# Patient Record
Sex: Male | Born: 1935
Health system: Southern US, Community
[De-identification: ages and names within clinical notes are randomized; demographics above are authoritative.]

## PROBLEM LIST (undated history)

## (undated) DIAGNOSIS — I10 Essential (primary) hypertension: Secondary | ICD-10-CM

## (undated) DIAGNOSIS — I350 Nonrheumatic aortic (valve) stenosis: Secondary | ICD-10-CM

## (undated) DIAGNOSIS — E782 Mixed hyperlipidemia: Secondary | ICD-10-CM

## (undated) DIAGNOSIS — IMO0001 Reserved for inherently not codable concepts without codable children: Secondary | ICD-10-CM

## (undated) DIAGNOSIS — I351 Nonrheumatic aortic (valve) insufficiency: Secondary | ICD-10-CM

## (undated) DIAGNOSIS — R1310 Dysphagia, unspecified: Secondary | ICD-10-CM

## (undated) DIAGNOSIS — K219 Gastro-esophageal reflux disease without esophagitis: Secondary | ICD-10-CM

## (undated) DIAGNOSIS — I2510 Atherosclerotic heart disease of native coronary artery without angina pectoris: Secondary | ICD-10-CM

## (undated) DIAGNOSIS — Z5189 Encounter for other specified aftercare: Secondary | ICD-10-CM

## (undated) DIAGNOSIS — I779 Disorder of arteries and arterioles, unspecified: Secondary | ICD-10-CM

## (undated) DIAGNOSIS — I739 Peripheral vascular disease, unspecified: Secondary | ICD-10-CM

## (undated) DIAGNOSIS — K279 Peptic ulcer, site unspecified, unspecified as acute or chronic, without hemorrhage or perforation: Secondary | ICD-10-CM

## (undated) HISTORY — DX: Peptic ulcer, site unspecified, unspecified as acute or chronic, without hemorrhage or perforation: K27.9

## (undated) HISTORY — DX: Peripheral vascular disease, unspecified: I73.9

## (undated) HISTORY — PX: APPENDECTOMY: SHX54

## (undated) HISTORY — DX: Nonrheumatic aortic (valve) stenosis: I35.0

## (undated) HISTORY — DX: Dysphagia, unspecified: R13.10

## (undated) HISTORY — DX: Nonrheumatic aortic (valve) insufficiency: I35.1

## (undated) HISTORY — DX: Disorder of arteries and arterioles, unspecified: I77.9

## (undated) HISTORY — DX: Essential (primary) hypertension: I10

## (undated) HISTORY — DX: Mixed hyperlipidemia: E78.2

## (undated) HISTORY — DX: Atherosclerotic heart disease of native coronary artery without angina pectoris: I25.10

---

## 2007-04-13 ENCOUNTER — Ambulatory Visit: Payer: Self-pay | Admitting: Cardiology

## 2007-04-20 ENCOUNTER — Encounter: Payer: Self-pay | Admitting: Cardiology

## 2007-04-20 ENCOUNTER — Ambulatory Visit: Payer: Self-pay | Admitting: Cardiology

## 2007-07-21 ENCOUNTER — Encounter: Payer: Self-pay | Admitting: Cardiology

## 2007-07-27 ENCOUNTER — Encounter: Payer: Self-pay | Admitting: Cardiology

## 2007-11-15 ENCOUNTER — Ambulatory Visit: Payer: Self-pay | Admitting: Cardiology

## 2008-06-08 ENCOUNTER — Ambulatory Visit: Payer: Self-pay | Admitting: Cardiology

## 2008-06-13 ENCOUNTER — Encounter: Payer: Self-pay | Admitting: Cardiology

## 2008-06-29 ENCOUNTER — Encounter: Payer: Self-pay | Admitting: Physician Assistant

## 2008-09-20 DIAGNOSIS — I2510 Atherosclerotic heart disease of native coronary artery without angina pectoris: Secondary | ICD-10-CM

## 2008-09-20 DIAGNOSIS — E785 Hyperlipidemia, unspecified: Secondary | ICD-10-CM

## 2008-11-12 ENCOUNTER — Ambulatory Visit: Payer: Self-pay | Admitting: Cardiology

## 2008-11-12 DIAGNOSIS — I10 Essential (primary) hypertension: Secondary | ICD-10-CM | POA: Insufficient documentation

## 2008-11-12 DIAGNOSIS — I359 Nonrheumatic aortic valve disorder, unspecified: Secondary | ICD-10-CM | POA: Insufficient documentation

## 2008-11-14 ENCOUNTER — Telehealth: Payer: Self-pay | Admitting: Cardiology

## 2009-05-14 ENCOUNTER — Encounter: Payer: Self-pay | Admitting: Cardiology

## 2009-05-28 ENCOUNTER — Ambulatory Visit: Payer: Self-pay | Admitting: Cardiology

## 2009-08-14 ENCOUNTER — Encounter: Payer: Self-pay | Admitting: Cardiology

## 2009-11-25 ENCOUNTER — Encounter (INDEPENDENT_AMBULATORY_CARE_PROVIDER_SITE_OTHER): Payer: Self-pay | Admitting: *Deleted

## 2009-11-25 ENCOUNTER — Ambulatory Visit: Payer: Self-pay | Admitting: Cardiology

## 2009-11-28 ENCOUNTER — Ambulatory Visit: Payer: Self-pay | Admitting: Cardiology

## 2009-11-28 ENCOUNTER — Encounter: Payer: Self-pay | Admitting: Cardiology

## 2009-12-03 ENCOUNTER — Encounter (INDEPENDENT_AMBULATORY_CARE_PROVIDER_SITE_OTHER): Payer: Self-pay | Admitting: *Deleted

## 2010-02-11 NOTE — Assessment & Plan Note (Signed)
Summary: 6 MOF U   Visit Type:  Follow-up Primary Provider:  Dr. Lia Hopping   History of Present Illness: 77 year old male presents for followup. He was seen back in May. He is here with his wife. He reports no angina. He has been walking perhaps twice a week without major functional limitation. I reviewed his medications which are outlined below. Dr. Olena Leatherwood continues to follow lipids and liver function. Blood pressure is elevated somewhat today. We discussed this again. He continues to report better control at home. Last ischemic testing since his intervention with DES to the LAD in October 2007 was 2 and a half years ago.  He states that his youngest brother just recently died suddenly with a myocardial infarction.  Clinical Review Panels:  Echocardiogram Echocardiogram 1. Mild to moderate concentric left ventricular hypertrophy is         observed.         2. Global left ventricular wall motion and contractility are within         normal limits.         3. The estimated ejection fraction is greater than 65%. It is 70%.         4. The right ventricular global systolic function is normal.         5. The aortic valve leaflets are moderately thickened.         6. Mild aortic leaflet calcification is visualized.         7. Systolic excursion of the aortic valve cusps is reduced.         8. There is mild aortic regurgitation.          9. There is mild aortic stenosis. (04/20/2007)    Preventive Screening-Counseling & Management  Alcohol-Tobacco     Smoking Status: never  Current Medications (verified): 1)  Lotrel 10-20 Mg Caps (Amlodipine Besy-Benazepril Hcl) .... Take One Tab By Mouth Daily 2)  Simvastatin 40 Mg Tabs (Simvastatin) .... Take One Tablet By Mouth Daily At Bedtime 3)  Lopressor 50 Mg Tabs (Metoprolol Tartrate) .... Take 1 Tablet By Mouth Two Times A Day 4)  Furosemide 20 Mg Tabs (Furosemide) .... Take 1 Tablet By Mouth Once A Day 5)  Klor-Con M10 10 Meq Cr-Tabs  (Potassium Chloride Crys Cr) .... Take 1 Tablet By Mouth Once A Day 6)  Aspirin 81 Mg Tbec (Aspirin) .... Take 1 Tablet By Mouth Once A Day 7)  Multivitamins  Tabs (Multiple Vitamin) .... Take 1 Tablet By Mouth Once A Day 8)  Nitrostat 0.4 Mg Subl (Nitroglycerin) .... Use As Directed  Allergies (verified): No Known Drug Allergies  Comments:  Nurse/Medical Assistant: The patient's medication list and allergies were reviewed with the patient and were updated in the Medication and Allergy Lists.  Past History:  Past Medical History: Last updated: 11/12/2008 CAD - DES to LAD October 2007 Cerebrovascular Disease - mild carotid artery atherosclerosis with bruits Hyperlipidemia Hypertension Aortic Stenosis - mild Aortic Insufficiency - mild Diabetes Type 2 Peptic ulcer disease  Social History: Last updated: 09/20/2008 Married  Tobacco Use - No.  Alcohol Use - yes Regular Exercise - yes Drug Use - no  Review of Systems  The patient denies anorexia, fever, chest pain, syncope, dyspnea on exertion, peripheral edema, melena, and hematochezia.         Otherwise reviewed and negative.  Vital Signs:  Patient profile:   77 year old male Height:      70 inches Weight:  183 pounds BMI:     26.35 Pulse rate:   76 / minute BP sitting:   151 / 74  (left arm) Cuff size:   regular  Vitals Entered By: Carlye Grippe (November 25, 2009 11:00 AM)  Nutrition Counseling: Patient's BMI is greater than 25 and therefore counseled on weight management options.  Physical Exam  Additional Exam:  Overweight male in no acute distress. HEENT: Conjunctiva and lids normal, oropharynx is clear. Neck: Supple, soft carotid bruits as noted previously. Lungs: Clear without labored breathing. Cardiac: Regular rate and rhythm, 2/6 systolic murmur at the right base. No S3 gallop. Abdomen: Soft, nontender, bowel sounds present. Extremities: No pitting edema, distal pulses 2+. Skin: Warm and  dry. Musculoskeletal: No gross deformities. Neuropsychiatric: Alert and oriented x3, affect appropriate.   Nuclear Study  Procedure date:  04/20/2007  Findings:      No diagnostic ST segment changes or chest pain. LVEF 66%. Fixed defect in the inferior wall suggests either diaphragmatic attenuation or scar. No frank ischemia.  EKG  Procedure date:  11/25/2009  Findings:      Sinus rhythm at 73 beats per minute with nonspecific T-wave changes.  Impression & Recommendations:  Problem # 1:  CAD, NATIVE VESSEL (ICD-414.01)  Patient relatively stable on medical therapy following prior intervention with DES to the LAD in October 2007. Last ischemic evaluation was 2-1/2 years ago. ECG shows nonspecific T-wave changes. Blood pressure trend does seem to have crept up over time. He is exercising less than he used to. We discussed these issues, and will also recommend a followup Lexiscan Cardiolite on medical therapy to exclude any progression in ischemic burden. If stable, followup in 6 months, otherwise can discuss further.  His updated medication list for this problem includes:    Lotrel 10-20 Mg Caps (Amlodipine besy-benazepril hcl) .[name]... Take one tab by mouth daily    Lopressor 50 Mg Tabs (Metoprolol tartrate) .[name]... Take 1 tablet by mouth two times a day    Aspirin 81 Mg Tbec (Aspirin) .[name]... Take 1 tablet by mouth once a day    Nitrostat 0.4 Mg Subl (Nitroglycerin) ..... Use as directed  Orders: EKG w/ Interpretation (93000) Nuclear Med (Nuc Med)  Problem # 2:  ESSENTIAL HYPERTENSION, BENIGN (ICD-401.1)  Blood pressure trend is up. We discussed sodium restriction, also followup with primary care physician. May need further up titration medical therapy.  His updated medication list for this problem includes:    Lotrel 10-20 Mg Caps (Amlodipine besy-benazepril hcl) .[name]... Take one tab by mouth daily    Lopressor 50 Mg Tabs (Metoprolol tartrate) .[name]... Take 1 tablet by mouth two times a  day    Furosemide 20 Mg Tabs (Furosemide) .[name]... Take 1 tablet by mouth once a day    Aspirin 81 Mg Tbec (Aspirin) .[name]... Take 1 tablet by mouth once a day  Problem # 3:  HYPERLIPIDEMIA-MIXED (ICD-272.4)  Followed by Dr. Olena Leatherwood.  His updated medication list for this problem includes:    Simvastatin 40 Mg Tabs (Simvastatin) .[name]... Take one tablet by mouth daily at bedtime  Problem # 4:  AORTIC DISORDER (ICD-424.1)  Mild aortic stenosis and mild aortic regurgitation.  His updated medication list for this problem includes:    Lotrel 10-20 Mg Caps (Amlodipine besy-benazepril hcl) .[name]... Take one tab by mouth daily    Lopressor 50 Mg Tabs (Metoprolol tartrate) .[name]... Take 1 tablet by mouth two times a day    Furosemide 20 Mg Tabs (Furosemide) .[name]... Take  1 tablet by mouth once a day    Nitrostat 0.4 Mg Subl (Nitroglycerin) ..... Use as directed  Patient Instructions: 1)  Your physician wants you to follow-up in: 6 months. You will receive a reminder letter in the mail one-two months in advance. If you don't receive a letter, please call our office to schedule the follow-up appointment. 2)  Your physician has requested that you have an Best boy.  For further information please visit [email or web address]  Please follow instruction sheet, as given. If the results of your test are normal or stable, you will receive a letter. If they are abnormal, the nurse will contact you by phone.  3)  Your physician recommends that you continue on your current medications as directed. Please refer to the Current Medication list given to you today.

## 2010-02-11 NOTE — Letter (Signed)
Summary: Lexiscan or Dobutamine Pharmacist, community at [facility]  [address]   [place], [place] [address]   Phone: [phone]  Fax: [phone]      [facility] Cardiovascular Services  Lexiscan or Dobutamine Cardiolite Strss Test    [facility]  Appointment Date:_  Appointment Time:_  Your doctor has ordered a CARDIOLITE STRESS TEST using a medication to stimulate exercise so that you will not have to walk on the treadmill to determine the condition of your heart during stress. If you take blood pressure medication, ask your doctor if you should take it the day of your test. You should not have anything to eat or drink at least 4 hours before your test is scheduled, and no caffeine, including decaffeinated tea and coffee, chocolate, and soft drinks for 24 hours before your test.  You will need to register at the Outpatient/Main Entrance at the hospital 15 minutes before your appointment time. It is a good idea to bring a copy of your order with you. They will direct you to the Diagnostic Imaging (Radiology) Department.  You will be asked to undress from the waist up and given a hospital gown to wear, so dress comfortably from the waist down for example: Sweat pants, shorts, or skirt Rubber soled lace up shoes (tennis shoes)  Plan on about three hours from registration to release from the hospital  You may take your medications with water the morning of your test.  If the results of your test are normal or stable, you will receive a letter. If they are abnormal, the nurse will contact you by phone.

## 2010-02-11 NOTE — Assessment & Plan Note (Signed)
Summary: 6 mo fu per may reminder-srs   Visit Type:  Follow-up Primary Provider:  Dr. Lia Hopping   History of Present Illness: 77 year old male presents for followup. He denies any problems with angina. He and his wife just visited the [facility], he did some walking there without any significant shortness of breath. He reports compliance with his medications which are reviewed below.  Mr. Deharo states that he just had recent lipids obtained by Dr. Olena Leatherwood.  Today we reviewed his echocardiogram from April 2009. He has a cardiac murmur consistent with mild aortic stenosis, previously documented.  We also discussed considering a followup stress test around the time of his next visit.  Preventive Screening-Counseling & Management  Alcohol-Tobacco     Smoking Status: never  Current Medications (verified): 1)  Lotrel 10-20 Mg Caps (Amlodipine Besy-Benazepril Hcl) .... Take One Tab By Mouth Daily 2)  Simvastatin 40 Mg Tabs (Simvastatin) .... Take One Tablet By Mouth Daily At Bedtime 3)  Lopressor 50 Mg Tabs (Metoprolol Tartrate) .... Take 1 Tablet By Mouth Two Times A Day 4)  Furosemide 20 Mg Tabs (Furosemide) .... Take 1 Tablet By Mouth Once A Day 5)  Klor-Con M10 10 Meq Cr-Tabs (Potassium Chloride Crys Cr) .... Take 1 Tablet By Mouth Once A Day 6)  Aspirin 81 Mg Tbec (Aspirin) .... Take 1 Tablet By Mouth Once A Day 7)  Multivitamins  Tabs (Multiple Vitamin) .... Take 1 Tablet By Mouth Once A Day 8)  Nitrostat 0.4 Mg Subl (Nitroglycerin) .... Use As Directed  Allergies (verified): No Known Drug Allergies  Comments:  Nurse/Medical Assistant: The patient is currently on medications but does not know the name or dosage at this time. Instructed to contact our office with details. Will update medication list at that time.  Past History:  Past Medical History: Last updated: 11/12/2008 CAD - DES to LAD October 2007 Cerebrovascular Disease - mild carotid artery atherosclerosis with  bruits Hyperlipidemia Hypertension Aortic Stenosis - mild Aortic Insufficiency - mild Diabetes Type 2 Peptic ulcer disease  Social History: Last updated: 09/20/2008 Married  Tobacco Use - No.  Alcohol Use - yes Regular Exercise - yes Drug Use - no  Review of Systems  The patient denies anorexia, fever, chest pain, syncope, dyspnea on exertion, peripheral edema, prolonged cough, melena, and hematochezia.         Otherwise reviewed and negative except as outlined.  Clinical Review Panels:  Echocardiogram Echocardiogram 1. Mild to moderate concentric left ventricular hypertrophy is         observed.         2. Global left ventricular wall motion and contractility are within         normal limits.         3. The estimated ejection fraction is greater than 65%. It is 70%.         4. The right ventricular global systolic function is normal.         5. The aortic valve leaflets are moderately thickened.         6. Mild aortic leaflet calcification is visualized.         7. Systolic excursion of the aortic valve cusps is reduced.         8. There is mild aortic regurgitation.          9. There is mild aortic stenosis. (04/20/2007)    Vital Signs:  Patient profile:   77 year old male Height:  70 inches Weight:      190 pounds Pulse rate:   62 / minute BP sitting:   161 / 69  (left arm) Cuff size:   regular  Vitals Entered By: Carlye Grippe (May 28, 2009 10:47 AM)  Physical Exam  Additional Exam:  Overweight male in no acute distress. HEENT: Conjunctiva and lids normal, oropharynx is clear. Neck: Supple, soft carotid bruits as noted previously. Lungs: Clear without labored breathing. Cardiac: Regular rate and rhythm, 2/6 systolic murmur at the right base. No S3 gallop. Extremities: No pitting edema, distal pulses 2+.   EKG  Procedure date:  05/28/2009  Findings:      Normal sinus rhythm at 62 beats per minute.  Impression & Recommendations:  Problem #  1:  CAD, NATIVE VESSEL (ICD-414.01)  Symptomatically stable. Status post DES to LAD October 2007. I will see him back in 6 months, and we can discuss a followup stress test at that time for ischemic surveillance.  His updated medication list for this problem includes:    Lotrel 10-20 Mg Caps (Amlodipine besy-benazepril hcl) .[name]... Take one tab by mouth daily    Lopressor 50 Mg Tabs (Metoprolol tartrate) .[name]... Take 1 tablet by mouth two times a day    Aspirin 81 Mg Tbec (Aspirin) .[name]... Take 1 tablet by mouth once a day    Nitrostat 0.4 Mg Subl (Nitroglycerin) ..... Use as directed  Orders: EKG w/ Interpretation (93000)  Problem # 2:  ESSENTIAL HYPERTENSION, BENIGN (ICD-401.1)  Blood pressure elevated today. I discussed this with Mr. Chap. He reports systolic numbers in the "120-140" range at home. I asked him to keep a close eye on this, and follow up with Dr. Olena Leatherwood, as he may need further medication adjustments.  His updated medication list for this problem includes:    Lotrel 10-20 Mg Caps (Amlodipine besy-benazepril hcl) .[name]... Take one tab by mouth daily    Lopressor 50 Mg Tabs (Metoprolol tartrate) .[name]... Take 1 tablet by mouth two times a day    Furosemide 20 Mg Tabs (Furosemide) .[name]... Take 1 tablet by mouth once a day    Aspirin 81 Mg Tbec (Aspirin) .[name]... Take 1 tablet by mouth once a day  Problem # 3:  HYPERLIPIDEMIA-MIXED (ICD-272.4)  Followed by Dr. Olena Leatherwood. Will request recent lipid profile numbers for review.  His updated medication list for this problem includes:    Simvastatin 40 Mg Tabs (Simvastatin) .[name]... Take one tablet by mouth daily at bedtime  Patient Instructions: 1)  Your physician wants you to follow-up in: 6 months. You will receive a reminder letter in the mail one-two months in advance. If you don't receive a letter, please call our office to schedule the follow-up appointment. 2)  We have called Dr. Olena Leatherwood to request your recent lab results.  3)  Your  physician recommends that you continue on your current medications as directed. Please refer to the Current Medication list given to you today.

## 2010-02-11 NOTE — Letter (Signed)
Summary: Engineer, materials at [facility]  [address]   [place], [place] [address]   Phone: [phone]  Fax: [phone]        December 03, 2009 MRN: [id number]    [facility] [address] [place], [place] [address]    Dear Mr. Dicicco,  Your test ordered by Selena Batten has been reviewed by your physician (or physician assistant) and was found to be normal or stable. Your physician (or physician assistant) felt no changes were needed at this time.  ____ Echocardiogram  __X__ Cardiac Stress Test  ____ Lab Work  ____ Peripheral vascular study of arms, legs or neck  ____ CT scan or X-ray  ____ Lung or Breathing test  ____ Other:   Thank you.   Cyril Loosen, RN, BSN    Duane Boston, M.D., F.A.C.C. Thressa Sheller, M.D., F.A.C.C. Oneal Grout, M.D., F.A.C.C. Cheree Ditto, M.D., F.A.C.C. Daiva Nakayama, M.D., F.A.C.C. Kenney Houseman, M.D., F.A.C.C. Jeanne Ivan, PA-C

## 2010-05-22 ENCOUNTER — Encounter: Payer: Self-pay | Admitting: Cardiology

## 2010-05-23 ENCOUNTER — Encounter: Payer: Self-pay | Admitting: Cardiology

## 2010-05-23 ENCOUNTER — Ambulatory Visit (INDEPENDENT_AMBULATORY_CARE_PROVIDER_SITE_OTHER): Payer: Medicare Other | Admitting: Cardiology

## 2010-05-23 VITALS — BP 159/73 | HR 59 | Ht 70.0 in | Wt 186.0 lb

## 2010-05-23 DIAGNOSIS — I10 Essential (primary) hypertension: Secondary | ICD-10-CM

## 2010-05-23 DIAGNOSIS — I2510 Atherosclerotic heart disease of native coronary artery without angina pectoris: Secondary | ICD-10-CM

## 2010-05-23 DIAGNOSIS — I359 Nonrheumatic aortic valve disorder, unspecified: Secondary | ICD-10-CM

## 2010-05-23 DIAGNOSIS — E785 Hyperlipidemia, unspecified: Secondary | ICD-10-CM

## 2010-05-23 DIAGNOSIS — I350 Nonrheumatic aortic (valve) stenosis: Secondary | ICD-10-CM

## 2010-05-23 NOTE — Assessment & Plan Note (Signed)
Symptomatically stable on medical therapy. Continue regular exercise. Cardiolite from November 2011 was overall reassuring. Continue observation.

## 2010-05-23 NOTE — Assessment & Plan Note (Signed)
Mild, asymptomatic, with leukopenia. 

## 2010-05-23 NOTE — Progress Notes (Signed)
Clinical Summary Edward Pena is a 77 y.o.male presenting for followup.  He was seen in November 2011. Followup Cardiolite was arranged at that time, noted below. He missed his last followup visit.  He reports no progressive angina or shortness of breath. Continues to do daily chores indoors and out. Indicates compliance with his medications. He is changing primary care physicians, plans to establish with Dr. Sherril Croon next month.  Checks his blood pressure regularly at home, also at [employer]. States his systolics are typically in the 120s to 130s.   No Known Allergies  Current outpatient prescriptions:amLODipine-benazepril (LOTREL) 10-20 MG per capsule, Take 1 capsule by mouth daily.  , Disp: , Rfl: ;  aspirin 81 MG tablet, Take 81 mg by mouth daily.  , Disp: , Rfl: ;  furosemide (LASIX) 20 MG tablet, Take 20 mg by mouth daily.  , Disp: , Rfl: ;  metoprolol (LOPRESSOR) 50 MG tablet, Take 50 mg by mouth 2 (two) times daily.  , Disp: , Rfl:  Multiple Vitamin (MULTIVITAMIN) tablet, Take 1 tablet by mouth daily.  , Disp: , Rfl: ;  nitroGLYCERIN (NITROSTAT) 0.4 MG SL tablet, Place 0.4 mg under the tongue every 5 (five) minutes as needed.  , Disp: , Rfl: ;  potassium chloride (KLOR-CON) 10 MEQ CR tablet, Take 10 mEq by mouth daily.  , Disp: , Rfl: ;  simvastatin (ZOCOR) 40 MG tablet, Take 40 mg by mouth at bedtime.  , Disp: , Rfl:   Past Medical History  Diagnosis Date  . Coronary atherosclerosis of native coronary artery     DES LAD 10/07  . Carotid artery disease     Mild  . Essential hypertension, benign   . Mixed hyperlipidemia   . Aortic stenosis     Mild  . Aortic regurgitation     Mild  . Type 2 diabetes mellitus   . Peptic ulcer disease     Social History Mr. Eckhardt reports that he has never smoked. He has never used smokeless tobacco. Mr. Kall reports that he drinks alcohol.  Review of Systems No palpitations or syncope. No reported bleeding problems. Otherwise reviewed and  negative.  Physical Examination Filed Vitals:   05/23/10 1045  BP: 159/73  Pulse: 59   Overweight male in no acute distress. HEENT: Conjunctiva and lids normal, oropharynx is clear. Neck: Supple, soft carotid bruits as noted previously. Lungs: Clear without labored breathing. Cardiac: Regular rate and rhythm, 2/6 systolic murmur at the right base. No S3 gallop. Abdomen: Soft, nontender, bowel sounds present. Extremities: No pitting edema, distal pulses 2+. Skin: Warm and dry. Musculoskeletal: No gross deformities. Neuropsychiatric: Alert and oriented x3, affect appropriate.  ECG Sinus rhythm at 60 with PR interval 220 ms.  Studies Lexiscan Cardiolite 11/28/2009: No diagnostic ST changes. LVEF 71%. Fixed inferior defect consistent with scar with overlay of diaphragmatic attenuation, no active ischemia. Problem List and Plan

## 2010-05-23 NOTE — Assessment & Plan Note (Signed)
Patient to establish with Dr. Sherril Croon next month. Will likely have blood work at that time. Aim for LDL control around 70.

## 2010-05-23 NOTE — Assessment & Plan Note (Signed)
Blood pressure elevated today, although sounds better controlled in general based on outpatient checks. I asked the patient to keep a close eye on this. Sodium restriction recommended.

## 2010-05-23 NOTE — Patient Instructions (Signed)
Continue all current medications. Your physician wants you to follow up in: 6 months.  You will receive a reminder letter in the mail one-two months in advance.  If you don't receive a letter, please call our office to schedule the follow up appointment   

## 2010-05-27 NOTE — Assessment & Plan Note (Signed)
[facility]                          [facility] OFFICE NOTE   NAME:GOINSTevion, Laforge                          MRN:          [id number]  DATE:06/08/2008                            DOB:          04-Nov-1926    PRIMARY CARE PHYSICIAN:  Dr. Lia Hopping.   REASON FOR VISIT:  Scheduled followup.   HISTORY OF PRESENT ILLNESS:  Mr. Wix is doing fairly well since his  last visit in November 2009.  He reports no problems with recurrent  angina.  He has [place] the mall with his [place].  He  has NYHA class II dyspnea on exertion.  Today, his followup  electrocardiogram is normal showing sinus rhythm at 62 beats per minute.  I did refer him for followup carotid Dopplers with history of carotid  bruits and this was reassuring demonstrating mild bilateral carotid  bifurcation and proximal internal carotid plaque without hemodynamic  stenosis.  His blood pressure remains elevated today.  We have talked  about making some medication adjustments in the past.  We also discussed  aggressive management of his LDL.   ALLERGIES:  No known drug allergies.   MEDICATIONS:  1. Amlodipine.  2. Benazepril 5/20 mg p.o. daily.  3. Lopressor 50 mg p.o. b.i.d.  4. Furosemide 20 mg p.o. daily.  5. Klor-Con 10 mEq p.o. daily.  6. Pravastatin 40 mg p.o. daily.  7. Aspirin 81 mg p.o. daily.  8. Multivitamin 1 p.o. daily.  9. Sublingual nitroglycerin 0.4 mg p.r.n. (has not needed).   REVIEW OF SYSTEMS:  Outlined above.  Otherwise reviewed and negative.   PHYSICAL EXAMINATION:  VITAL SIGNS:  Blood pressure 163/71; heart rate  is 62; weight is 184 pounds, which is up 3 pounds from his last visit.  GENERAL:  He is comfortable in no acute distress.  HEENT:  Conjunctivae normal.  Oropharynx is clear.  NECK:  Supple.  Soft carotid bruits are noted as before.  No  thyromegaly.  LUNGS:  Clear without labored breathing.  HEART:  Regular rate and rhythm, 2/6 systolic  murmur at the right base.  No diastolic murmur.  No S3 gallop.  ABDOMEN:  Soft, nontender.  No active bowel sounds.  EXTREMITIES:  Exhibit no pitting edema.  Distal pulses are 2+.  SKIN:  Warm and dry.  MUSCULOSKELETAL:  No kyphosis noted.  NEUROPSYCHIATRIC:  The patient is alert and oriented x3.  Affect is  appropriate.   IMPRESSION/RECOMMENDATIONS:  1. Coronary artery disease status post previous drug-eluting stent      placement in October 2007 to the left anterior descending based on      limited records (done in [place]).  He has subsequent      documentation of normal left ventricular systolic function with      mild aortic stenosis and mild aortic regurgitation as of April      2009.  Ischemic testing at that time was reassuring as well.  He is      not reporting any progressive angina and we will therefore continue      medical  therapy with observation over the next 6 months.  2. Hypertension, non-optimally controlled.  We will increase      amlodipine and benazepril to 10/20 mg p.o. daily.  3. History of hyperlipidemia, on pravastatin.  In reviewing his      available lipid records, his LDL has not been at goal.  I will      arrange a followup lipid profile and liver function tests and      consider switching him to simvastatin depending on the results.      Ideally, his LDL should be around 70.  4. Mild carotid artery plaque with bruits at baseline.     Jonelle Sidle, MD  Electronically Signed    SGM/MedQ  DD: 06/08/2008  DT: 06/09/2008  Job #: [id number]   cc:   Lia Hopping

## 2010-05-27 NOTE — Assessment & Plan Note (Signed)
[facility]                          [facility]   NAME:Edward Pena, Cafaro                          MRN:          [id number]  DATE:11/15/2007                            DOB:          [birth year]    PRIMARY CARE PHYSICIAN:  Lia Hopping, MD.   REASON FOR VISIT:  Scheduled followup.   HISTORY OF PRESENT ILLNESS:  I met [name] back in April of this year  to establish cardiology followup.  He continues to do fairly well  without any significant angina.  He walks at least a mile approximately  3 or 4 times a week with his wife.  I referred him for followup for  surveillance testing in light of his previous history of drug-eluting  stent placement back in 2007.  He had a Cardiolite study, which  demonstrated diaphragmatic attenuation versus mild degree of scar  without any frank ischemia and associated with an overall normal  ejection fraction of 66%.  Echocardiography also demonstrated normal  left ventricular systolic function and there was evidence of mild aortic  stenosis with mild aortic regurgitation.  I reviewed these studies with  the patient today.  He is on a stable medical regimen and is due for  followup lipids with Dr. Olena Leatherwood in December.   ALLERGIES:  No known drug allergies.   PRESENT MEDICATIONS:  1. Amlodipine/benazepril 5/20 mg p.o. daily.  2. Metoprolol 50 mg p.o. b.i.d.  3. Lasix 20 mg p.o. daily.  4. Klor-Con 10 mEq p.o. daily.  5. Pravastatin 40 mg p.o. daily.  6. Aspirin 81 mg p.o. daily.  7. Multivitamin daily.  8. Omega-3 supplement 1000 mg daily.  9. Nitroglycerin 0.4 mg p.r.n.   REVIEW OF SYSTEMS:  As outlined above, otherwise negative.   PHYSICAL EXAMINATION:  VITAL SIGNS:  Blood pressure today elevated at  171/75, heart rate is 69, and weight is 181 pounds.  GENERAL:  The patient is comfortable, and in no acute distress.  HEENT:  Conjunctivae are normal.  Pharynx is clear.  NECK:  Supple.  Soft carotid  bruit is noted, left greater than right.  No thyromegaly.  LUNGS:  Clear without labored breathing.  CARDIAC:  Reveals a regular rate and rhythm, 2/6 systolic murmur at the  base.  No diastolic murmur.  No S3 gallop.  EXTREMITIES:  Exhibit no  significant pitting edema.   IMPRESSION AND RECOMMENDATIONS:  1. Cardiovascular disease, status post previous drug-eluting stent      placement in October 2007 at a facility in [place].  This is      presumably in the distribution of left anterior descending based on      limited records.  He has normal left ventricular systolic function      and no significant angina at this time.  Recent followup Cardiolite      is a low risk.  I would anticipate continuing medical therapy.  I      have asked him to pay attention to his blood pressure.  He does      check this at home and states  that typically his systolics are in      the 130s.  He may well need up titration of his baseline therapy.      I also noticed carotid bruits on examination and we will plan      followup carotid Dopplers.  He is due for a followup lipid profile      with Dr. Olena Leatherwood in December and would tend to aim for aggressive      LDL control around 70.  Consider switching to a higher potency      statin, if this is not the case.  2. Otherwise, our plan to follow be over the next 6 months.     Jonelle Sidle, MD  Electronically Signed    SGM/MedQ  DD: 11/15/2007  DT: 11/16/2007  Job #: [id number]   cc:   Lia Hopping

## 2010-05-27 NOTE — Assessment & Plan Note (Signed)
[facility]                          [facility]   NAME:Edward Pena, Edward Pena                          MRN:          [id number]  DATE:04/13/2007                            DOB:          [birth year]    PRIMARY CARE PHYSICIAN:  Lia Hopping, M.D.   REASON FOR VISIT:  Establish cardiology follow up.   HISTORY OF PRESENT ILLNESS:  Edward Pena is a pleasant 77 year old male  with a reported history of coronary artery disease.  There are limited  records at this time.  He states that he was referred to Dr. Jim Desanctis back  in 2007 following an abnormal electrocardiogram, although without any  obvious angina symptoms.  He apparently had an abnormal stress test at  that time resulting in a cardiac catheterization in [place], [place] by  Dr. Lady Gary and subsequent placement of three drug-eluting stents,  vascular anatomy unknown.  This was done in October of 2007, and the  patient reports that he was on aspirin and Plavix for a total of 1 year.  He has not had a recent follow-up ischemic evaluation.  Symptomatically,  Edward Pena denies having any exertional angina or limiting dyspnea on  exertion.  He and his wife walk approximately 2-3 times a week, up to 30  minutes at a time.  His electrocardiogram today shows sinus rhythm with  nonspecific T-wave changes.  There is no old tracing for comparison.  He  has been compliant with his medications and states that Dr. Olena Leatherwood just  recently checked his lipid profile.   ALLERGIES:  NO KNOWN DRUG ALLERGIES.   PRESENT MEDICATIONS:  1. Amlodipine.  2. Benazepril 5/20 mg p.o. daily.  3. Metoprolol 50 mg p.o. b.i.d.  4. Lasix 20 mg p.o. daily.  5. Klor-Con 10 mEq p.o. daily.  6. Pravastatin 40 mg p.o. daily.  7. Aspirin 81 mg p.o. daily.  8. Multivitamin daily.  9. Omega 3 supplements 1000 mg p.o. daily (presently off of this).   PAST MEDICAL HISTORY:  1. As outlined above.  2. History of hypertension.  3.  Hyperlipidemia.  4. Diet-controlled type 2 diabetes mellitus.  5. He had previous peptic ulcer disease.  6. Prior appendectomy.   SOCIAL HISTORY:  The patient is married.  He has no active tobacco use  history.  Drinks two alcoholic beverages a day.  Has no children.   FAMILY HISTORY:  His family history was reviewed and is noncontributory  at present.   REVIEW OF SYSTEMS:  As outlined above.  The patient has had no fevers or  chills.  No cough or weight loss.  No visual changes or headaches.  No  palpitations, orthopnea or PND.  He does experience occasional right  ankle swelling and possibly gout.  Otherwise, systems are negative.   PHYSICAL EXAMINATION:  VITAL SIGNS:  Blood pressure is 136/70, heart  rate is 62, weight is 178.4 pounds.  GENERAL:  This is a well-nourished male in no acute distress.  HEENT:  Conjunctiva, lids normal.  Oropharynx is clear.  NECK:  Neck is supple.  No elevated jugular  venous pressure.  No loud  bruits are evident.  No thyromegaly.  LUNGS:  Clear without labored breathing.  CARDIAC:  A regular rate and rhythm.  A 2/6 systolic murmur heard at the  base.  Preserved second heart sound.  No S3 gallop or pericardial rub.  ABDOMEN:  Soft, nontender.  No bruits or pulsatile mass.  EXTREMITIES:  No frank pitting edema and distal pulses are 2+.  SKIN:  Warm and dry.  MUSCULOSKELETAL:  No kyphosis is noted.  NEUROPSYCHIATRIC:  The patient is alert and oriented x3.  Affect is  appropriate   IMPRESSION AND RECOMMENDATIONS:  1. Reported history of coronary artery disease as outlined above,      status post placement of what would appear to be three drug-eluting      stents back in October of 2007 in [place], [place].  He is stable      symptomatically, and his electrocardiogram shows nonspecific T-wave      changes.  Medical regimen looks to be adequate.  At this point, we      will plan to obtain his prior cardiac records to better understand      his  previous intervention and also refer him for a follow-up      adenosine Cardiolite on medical therapy, as well as an      echocardiogram to further assess cardiac murmur.  If these results      are reassuring, I will plan to see him back in approximately 6      months' time.  Otherwise, we can evaluate him further.  2. Hyperlipidemia, recently checked by Dr. Olena Leatherwood and on long-term      pravastatin.  LDL control should be around 70.  3. History of hypertension.  4. Reported history of diet-controlled diabetes mellitus.     Jonelle Sidle, MD  Electronically Signed    SGM/MedQ  DD: 04/13/2007  DT: 04/13/2007  Job #: [id number]   cc:   Lia Hopping

## 2010-07-01 ENCOUNTER — Encounter: Payer: Self-pay | Admitting: Cardiology

## 2010-07-03 ENCOUNTER — Telehealth: Payer: Self-pay | Admitting: *Deleted

## 2010-07-03 NOTE — Telephone Encounter (Signed)
Pt needs to confer with Dr Sherril Croon, who ordered the study, as to when he should return to [facility]

## 2010-07-03 NOTE — Telephone Encounter (Signed)
Pt's wife states pt was seen by Dr. Sherril Croon recently. She states Dr. Sherril Croon heard a murmur and ordered an Echo. She states Echo was abnormal and would like to know if pt needs to see Dr. Diona Browner. He was last seen by Dr. Diona Browner on May 11th and has a reminder in Epic for November. She is aware Dr. Diona Browner is out of the office this week and a note will be sent to Gene along with the Echo report for further review. (Pt also had a carotid doppler at [facility].)

## 2010-07-03 NOTE — Telephone Encounter (Signed)
Spoke with Edward Pena at [facility] who states Dr. Bonnita Levan notes say to f/u with Dr. Diona Browner regarding results and repeat Echo in 1 year.   Attempted to reach pt to schedule f/u with Dr. Diona Browner. Left message to call back on voicemail. (Scheduled pt for July 5th at 2pm opening d/t cancellation.)

## 2010-07-04 NOTE — Telephone Encounter (Signed)
Pt's wife notified and will have pt come on 7/5 for OV>

## 2010-07-17 ENCOUNTER — Ambulatory Visit (INDEPENDENT_AMBULATORY_CARE_PROVIDER_SITE_OTHER): Payer: Medicare Other | Admitting: Cardiology

## 2010-07-17 ENCOUNTER — Encounter: Payer: Self-pay | Admitting: Cardiology

## 2010-07-17 VITALS — BP 158/68 | HR 61 | Ht 70.0 in | Wt 183.1 lb

## 2010-07-17 DIAGNOSIS — I350 Nonrheumatic aortic (valve) stenosis: Secondary | ICD-10-CM

## 2010-07-17 DIAGNOSIS — I359 Nonrheumatic aortic valve disorder, unspecified: Secondary | ICD-10-CM

## 2010-07-17 DIAGNOSIS — I2510 Atherosclerotic heart disease of native coronary artery without angina pectoris: Secondary | ICD-10-CM

## 2010-07-17 DIAGNOSIS — I10 Essential (primary) hypertension: Secondary | ICD-10-CM

## 2010-07-17 NOTE — Assessment & Plan Note (Signed)
Overall moderate based on review of echocardiogram report. Patient is symptomatically stable. Plan will be a followup echocardiogram in 6 months to exclude rapid progression, and then determine appropriate medical followup from there.

## 2010-07-17 NOTE — Assessment & Plan Note (Signed)
Continue followup with Dr. Vyas. 

## 2010-07-17 NOTE — Patient Instructions (Signed)
Your physician wants you to follow-up in: 6 months. You will receive a reminder letter in the mail one-two months in advance. If you don't receive a letter, please call our office to schedule the follow-up appointment. Your physician has requested that you have an echocardiogram. Echocardiography is a painless test that uses sound waves to create images of your heart. It provides your doctor with information about the size and shape of your heart and how well your heart's chambers and valves are working. This procedure takes approximately one hour. There are no restrictions for this procedure. DO IN 6 MONTHS BEFORE YOUR NEXT OFFICE VISIT. Your physician recommends that you continue on your current medications as directed. Please refer to the Current Medication list given to you today.

## 2010-07-17 NOTE — Progress Notes (Signed)
Clinical Summary Edward Pena is a 77 y.o.male referred back to the office for followup. Just recently saw him in May, at which time he was clinically stable. He did undergo an echocardiogram through Dr. Bonnita Levan office, recently establishing with him. This was an [facility] study, read by an outside practice, the report of which indicates progression in aortic stenosis to the moderate to severe range. The continuity equation and planimetry measurements were however not consistent, and the mean aortic valve gradient was 36 mm mercury. He had mild to moderate aortic regurgitation, also PASP of 60 mm mercury.  Patient is here with his wife today to discuss the results. He continues to report no major functional limitations, NYHA class II dyspnea on exertion, no chest pain or palpitations. No syncope. We discussed the progressive nature of his aortic valve disease, and need for closer followup over time.   No Known Allergies  Current outpatient prescriptions:amLODipine-benazepril (LOTREL) 10-20 MG per capsule, Take 1 capsule by mouth daily.  , Disp: , Rfl: ;  aspirin 81 MG tablet, Take 81 mg by mouth daily.  , Disp: , Rfl: ;  metoprolol (LOPRESSOR) 50 MG tablet, Take 50 mg by mouth 2 (two) times daily.  , Disp: , Rfl: ;  Multiple Vitamin (MULTIVITAMIN) tablet, Take 1 tablet by mouth daily.  , Disp: , Rfl:  potassium chloride (KLOR-CON) 10 MEQ CR tablet, Take 10 mEq by mouth every other day. , Disp: , Rfl: ;  simvastatin (ZOCOR) 40 MG tablet, Take 40 mg by mouth at bedtime.  , Disp: , Rfl: ;  furosemide (LASIX) 20 MG tablet, Take 20 mg by mouth daily.  , Disp: , Rfl: ;  nitroGLYCERIN (NITROSTAT) 0.4 MG SL tablet, Place 0.4 mg under the tongue every 5 (five) minutes as needed.  , Disp: , Rfl:   Past Medical History  Diagnosis Date  . Coronary atherosclerosis of native coronary artery     DES LAD 10/07  . Carotid artery disease     Mild  . Essential hypertension, benign   . Mixed hyperlipidemia   .  Aortic stenosis     Mild  . Aortic regurgitation     Mild  . Type 2 diabetes mellitus   . Peptic ulcer disease     Past Surgical History  Procedure Date  . Appendectomy     Family History  Problem Relation Age of Onset  . Hypertension      Social History Edward Pena reports that he has never smoked. He has never used smokeless tobacco. Edward Pena reports that he drinks alcohol.  Review of Systems Otherwise reviewed and negative.  Physical Examination Filed Vitals:   07/17/10 1431  BP: 158/68  Pulse: 61   Overweight male in no acute distress.  HEENT: Conjunctiva and lids normal, oropharynx is clear.  Neck: Supple, soft carotid bruits as noted previously.  Lungs: Clear without labored breathing.  Cardiac: Regular rate and rhythm, 2/6 systolic murmur at the right base. No S3 gallop.  Abdomen: Soft, nontender, bowel sounds present.  Extremities: No pitting edema, distal pulses 2+.  Skin: Warm and dry.  Musculoskeletal: No gross deformities.  Neuropsychiatric: Alert and oriented x3, affect appropriate.    Problem List and Plan

## 2010-07-17 NOTE — Assessment & Plan Note (Signed)
Symptomatically stable on medical therapy without active angina.

## 2011-01-28 ENCOUNTER — Other Ambulatory Visit: Payer: Self-pay | Admitting: Cardiology

## 2011-01-28 DIAGNOSIS — I359 Nonrheumatic aortic valve disorder, unspecified: Secondary | ICD-10-CM

## 2011-02-11 ENCOUNTER — Other Ambulatory Visit: Payer: Medicare Other | Admitting: *Deleted

## 2011-02-12 ENCOUNTER — Other Ambulatory Visit: Payer: Medicare Other | Admitting: *Deleted

## 2011-02-12 ENCOUNTER — Other Ambulatory Visit (INDEPENDENT_AMBULATORY_CARE_PROVIDER_SITE_OTHER): Payer: Medicare Other | Admitting: *Deleted

## 2011-02-12 DIAGNOSIS — I359 Nonrheumatic aortic valve disorder, unspecified: Secondary | ICD-10-CM

## 2011-02-12 DIAGNOSIS — I2510 Atherosclerotic heart disease of native coronary artery without angina pectoris: Secondary | ICD-10-CM

## 2011-02-13 ENCOUNTER — Encounter: Payer: Self-pay | Admitting: Cardiology

## 2011-02-13 ENCOUNTER — Ambulatory Visit (INDEPENDENT_AMBULATORY_CARE_PROVIDER_SITE_OTHER): Payer: Medicare Other | Admitting: Cardiology

## 2011-02-13 VITALS — BP 181/68 | HR 61 | Ht 70.0 in | Wt 184.0 lb

## 2011-02-13 DIAGNOSIS — I10 Essential (primary) hypertension: Secondary | ICD-10-CM

## 2011-02-13 DIAGNOSIS — E782 Mixed hyperlipidemia: Secondary | ICD-10-CM

## 2011-02-13 DIAGNOSIS — I359 Nonrheumatic aortic valve disorder, unspecified: Secondary | ICD-10-CM

## 2011-02-13 DIAGNOSIS — I2510 Atherosclerotic heart disease of native coronary artery without angina pectoris: Secondary | ICD-10-CM

## 2011-02-13 NOTE — Assessment & Plan Note (Signed)
Continues on statin therapy. 

## 2011-02-13 NOTE — Progress Notes (Signed)
Clinical Summary Edward Pena is a 78 y.o.male presenting for followup. I saw him back in July 2012 for followup of aortic stenosis. He is here with his wife today. From a functional perspective, he remains active, walking with his wife regularly. They have both noticed however, that his stamina has been decreasing, more short of breath with activity. He is not reporting any chest pain or syncope. No palpitations.  He had a recent echocardiogram done that shows ormal left ventricular chamber size with mild basal septal hypertrophy, vigorous LVEF of 65-70% without wall motion abnormality. There is grade 1 diastolic dysfunction with increased left atrial pressure. Aortic stenosis has progressed moire clearly to severe range with peak velocity greater than 4 m/s, LVOT to aortic valve ratio of 0.27, and mean gradient of 44 mm mercury. Mild aortic regurgitation. Reviewed his results today.  In light of his progressive aortic stenosis, I have recommended consultation with cardiothoracic surgery to discuss his options. If he elects to proceed with intervention, we can then schedule a cardiac catheterization to assess the status of his coronary anatomy.   No Known Allergies  Current Outpatient Prescriptions  Medication Sig Dispense Refill  . amLODipine-benazepril (LOTREL) 10-20 MG per capsule Take 1 capsule by mouth daily.        [name] aspirin 81 MG tablet Take 81 mg by mouth daily.        . furosemide (LASIX) 20 MG tablet Take 20 mg by mouth daily.        . metoprolol (LOPRESSOR) 50 MG tablet Take 50 mg by mouth 2 (two) times daily.        . Multiple Vitamin (MULTIVITAMIN) tablet Take 1 tablet by mouth daily.        . nitroGLYCERIN (NITROSTAT) 0.4 MG SL tablet Place 0.4 mg under the tongue every 5 (five) minutes as needed.        . potassium chloride (KLOR-CON) 10 MEQ CR tablet Take 10 mEq by mouth daily.       . simvastatin (ZOCOR) 40 MG tablet Take 40 mg by mouth at bedtime.          Past Medical  History  Diagnosis Date  . Coronary atherosclerosis of native coronary artery     DES LAD 10/07  . Carotid artery disease     Mild  . Essential hypertension, benign   . Mixed hyperlipidemia   . Aortic stenosis   . Aortic regurgitation   . Type 2 diabetes mellitus   . Peptic ulcer disease     Past Surgical History  Procedure Date  . Appendectomy     Family History  Problem Relation Age of Onset  . Hypertension      Social History Edward Pena reports that he has never smoked. He has never used smokeless tobacco. Edward Pena reports that he drinks alcohol.  Review of Systems No reported bleeding problems, no fevers or chills. Stable appetite. Otherwise negative except as outlined.  Physical Examination Filed Vitals:   02/13/11 1441  BP: 181/68  Pulse: 61   Overweight male in no acute distress.  HEENT: Conjunctiva and lids normal, oropharynx is clear.  Neck: Supple, soft carotid bruits as noted previously.  Lungs: Clear without labored breathing.  Cardiac: Regular rate and rhythm, 2-3/6 systolic murmur at the right base. No S3 gallop. Second heart sound reduced. Abdomen: Soft, nontender, bowel sounds present.  Extremities: No pitting edema, distal pulses 2+.  Skin: Warm and dry.  Musculoskeletal: No gross deformities.  Neuropsychiatric:  Alert and oriented x3, affect appropriate.  ECG Sinus bradycardia at 58 with prolonged PR interval 240 ms, nonspecific T wave changes.   Problem List and Plan

## 2011-02-13 NOTE — Patient Instructions (Signed)
   Referral to TCTS  Follow up in 2 months - see above

## 2011-02-13 NOTE — Assessment & Plan Note (Addendum)
No active angina. History of DES to LAD in 2007. LVEF remains normal.

## 2011-02-13 NOTE — Assessment & Plan Note (Signed)
Progressed to severe range by recent followup echocardiogram. Although he does remain relatively active, walking with his wife, he has noticed a functional decline, increasing shortness of breath. No angina or palpitations, no syncope. Plan is to make a referral to cardiothoracic surgery for consultation. If intervention is pursued, we can arrange a preoperative cardiac catheterization to reassess his coronary anatomy.

## 2011-02-13 NOTE — Assessment & Plan Note (Signed)
Blood pressure up today. No change to present regimen as yet.

## 2011-02-17 ENCOUNTER — Telehealth: Payer: Self-pay | Admitting: *Deleted

## 2011-02-17 NOTE — Telephone Encounter (Signed)
Pt notified appt scheduled with Dr. Donata Clay on 03/04/11. Pt should arrive at 2pm for 2:30 pm appt. Pt verbalized understanding.

## 2011-03-04 ENCOUNTER — Encounter: Payer: Self-pay | Admitting: Cardiothoracic Surgery

## 2011-03-04 ENCOUNTER — Institutional Professional Consult (permissible substitution) (INDEPENDENT_AMBULATORY_CARE_PROVIDER_SITE_OTHER): Payer: Medicare Other | Admitting: Cardiothoracic Surgery

## 2011-03-04 VITALS — BP 160/74 | HR 60 | Resp 20 | Ht 70.0 in | Wt 183.0 lb

## 2011-03-04 DIAGNOSIS — Z9861 Coronary angioplasty status: Secondary | ICD-10-CM

## 2011-03-04 DIAGNOSIS — I359 Nonrheumatic aortic valve disorder, unspecified: Secondary | ICD-10-CM

## 2011-03-04 DIAGNOSIS — I350 Nonrheumatic aortic (valve) stenosis: Secondary | ICD-10-CM

## 2011-03-04 NOTE — Progress Notes (Signed)
PCP is VYAS,DHRUV B., MD, MD Referring Provider is Jonelle Sidle, MD  Chief Complaint  Patient presents with  . Aortic Stenosis    Referral from Dr Diona Browner for eval on aortic stenosis, ECHO 02/12/2011    HPI: The patient is an 78 year old Caucasian male with a long history of cardiac murmur followed with serial 2-D echo by Dr. Marilynn Latino and Dr. Diona Browner in [place]. Recently the patient is experienced decreased exercise tolerance and shortness of breath with exertion. A repeat echo showed increase in transvalvular aortic gradient with a peak gradient greater than 70 mm mercury. There is mild aortic insufficiency. LV function was well-preserved. No significant MR was noted. The patient felt to be a probable candidate for aortic replacement however surgical consultation was requested prior to catheterization.  The patient has a history of CAD and had drug-eluting stents placed in the LAD at [facility] in 2007. The patient has had no recurrent angina or problems and has not been catheterized since the drug-eluting stent placed.  Past Medical History  Diagnosis Date  . Coronary atherosclerosis of native coronary artery     DES LAD 10/07  . Carotid artery disease     Mild  . Essential hypertension, benign   . Mixed hyperlipidemia   . Aortic stenosis   . Aortic regurgitation   . Type 2 diabetes mellitus   . Peptic ulcer disease     Past Surgical History  Procedure Date  . Appendectomy     vagotomy and pyloroplasty in 1968 for peptic ulcer disease  Family History  Problem Relation Age of Onset  . Hypertension      Social History History  Substance Use Topics  . Smoking status: Never Smoker   . Smokeless tobacco: Never Used  . Alcohol Use: Yes     Occasional    Current Outpatient Prescriptions  Medication Sig Dispense Refill  . amLODipine-benazepril (LOTREL) 10-20 MG per capsule Take 1 capsule by mouth daily.        [name] aspirin 81 MG tablet Take 81 mg by mouth daily.        .  furosemide (LASIX) 20 MG tablet Take 20 mg by mouth as needed.       . metoprolol (LOPRESSOR) 50 MG tablet Take 50 mg by mouth 2 (two) times daily.        . Multiple Vitamin (MULTIVITAMIN) tablet Take 1 tablet by mouth daily.        . potassium chloride (KLOR-CON) 10 MEQ CR tablet Take 10 mEq by mouth as needed.       . simvastatin (ZOCOR) 40 MG tablet Take 40 mg by mouth at bedtime.          No Known Allergies  Review of Systems constitutional review negative for change in weight fever or night sweats, HEENT review negative for dental problems-he has his teeth cleaned twice a year, thoracic review negative her history of trauma pneumothorax pneumonia, cardiac positive for aortic stenosis positive for previous drug-eluting stent to the LAD in 2007 at a [facility]. Endocrine review negative for diabetes gastrin test review positive for peptic ulcer disease status post vagotomy pyloroplasty 1968-E. review negative for DVT claudication TIA neurologic negative h for stroke or seizure  BP 160/74  Pulse 60  Resp 20  Ht 5\' 10"  (1.778 m)  Wt 183 lb (83.008 kg)  BMI 26.26 kg/m2  SpO2 96% Physical Exam General appearance comfortable alert responsive Caucasian male HEENT normocephalic pupils equal dentition good Neck  supple without JVD mass or bruit Thoracic no deformity tenderness clear breath sounds Cardiac high pitched systolic ejection murmur of AAS no diastolic murmur noted regular rhythm Abdomen soft well-healed surgical scar without mass Extremities without clubbing cyanosis edema or tenderness Neurologic alert and oriented no focal motor deficit   Diagnostic Tests: 2-D echo from [facility] reviewed showing aortic stenosis LVH good LV function   Impression: Significant aortic stenosis and a very functional 78 year old gentleman would be an excellent candidate for aortic replacement. We'll recommend to his cardiologist that the patient be set up for left and right heart cardiac  catheterization at [place] and during the same stay undergo aortic valve replacement with a bioprosthetic valve   Plan: As above procedure plan discussed the patient and wife and they understand and agree

## 2011-03-06 ENCOUNTER — Other Ambulatory Visit: Payer: Self-pay | Admitting: *Deleted

## 2011-03-06 DIAGNOSIS — Z01810 Encounter for preprocedural cardiovascular examination: Secondary | ICD-10-CM

## 2011-03-06 DIAGNOSIS — I2510 Atherosclerotic heart disease of native coronary artery without angina pectoris: Secondary | ICD-10-CM

## 2011-03-06 DIAGNOSIS — I350 Nonrheumatic aortic (valve) stenosis: Secondary | ICD-10-CM

## 2011-03-09 ENCOUNTER — Telehealth: Payer: Self-pay | Admitting: *Deleted

## 2011-03-09 ENCOUNTER — Other Ambulatory Visit: Payer: Self-pay | Admitting: Cardiology

## 2011-03-09 ENCOUNTER — Encounter: Payer: Self-pay | Admitting: *Deleted

## 2011-03-09 DIAGNOSIS — I359 Nonrheumatic aortic valve disorder, unspecified: Secondary | ICD-10-CM

## 2011-03-09 NOTE — Telephone Encounter (Signed)
No precert required.  Medicare,BCBS °

## 2011-03-09 NOTE — Telephone Encounter (Signed)
OP Heart Cath  Left heart cath   03-10-2011

## 2011-03-09 NOTE — Progress Notes (Signed)
This is an addendum to the original complete note on 2/1. Mr. Nawaz was evaluated by Dr. Donata Clay last week, and following discussion, plan is for him to undergo aortic valve replacement later this week secondary to progressive, severe aortic valve stenosis. There has otherwise been no major change in clinical status. Mr. Boehringer is being scheduled for a cardiac catheterization on 2/26 with Dr. Riley Kill -  left and right heart catheterization although no clear need to cross valve based on recent echocardiogram. Per discussion with Dr. Donata Clay, plan is for the patient to then be admitted to the hospital after catheterization in anticipation of surgery on Thursday. This note with addendum can serve as the history and physical. Admission orders to be completed by hospital cardiology extender on 2/26, once information from cardiac catheterization has been obtained. Please also inform  TCTS service of patient's admission.  Jonelle Sidle, M.D., F.A.C.C.

## 2011-03-10 ENCOUNTER — Inpatient Hospital Stay (HOSPITAL_COMMUNITY)
Admission: AD | Admit: 2011-03-10 | Discharge: 2011-03-25 | DRG: 217 | Disposition: A | Discharge status: Home / Self Care | Payer: Medicare Other | Source: Ambulatory Visit | Attending: Cardiothoracic Surgery | Admitting: Cardiothoracic Surgery

## 2011-03-10 ENCOUNTER — Encounter (HOSPITAL_BASED_OUTPATIENT_CLINIC_OR_DEPARTMENT_OTHER)
Admission: RE | Disposition: A | Discharge status: Hospice | Payer: Self-pay | Source: Ambulatory Visit | Attending: Cardiology

## 2011-03-10 ENCOUNTER — Encounter (HOSPITAL_COMMUNITY): Payer: Self-pay | Admitting: *Deleted

## 2011-03-10 ENCOUNTER — Inpatient Hospital Stay (HOSPITAL_BASED_OUTPATIENT_CLINIC_OR_DEPARTMENT_OTHER)
Admission: RE | Admit: 2011-03-10 | Discharge: 2011-03-10 | Disposition: A | Discharge status: Hospice | Payer: Medicare Other | Source: Ambulatory Visit | Attending: Cardiology | Admitting: Cardiology

## 2011-03-10 ENCOUNTER — Other Ambulatory Visit: Payer: Self-pay

## 2011-03-10 ENCOUNTER — Encounter (HOSPITAL_BASED_OUTPATIENT_CLINIC_OR_DEPARTMENT_OTHER): Payer: Self-pay | Admitting: *Deleted

## 2011-03-10 DIAGNOSIS — D62 Acute posthemorrhagic anemia: Secondary | ICD-10-CM | POA: Diagnosis not present

## 2011-03-10 DIAGNOSIS — I2510 Atherosclerotic heart disease of native coronary artery without angina pectoris: Secondary | ICD-10-CM

## 2011-03-10 DIAGNOSIS — R001 Bradycardia, unspecified: Secondary | ICD-10-CM

## 2011-03-10 DIAGNOSIS — I080 Rheumatic disorders of both mitral and aortic valves: Principal | ICD-10-CM | POA: Diagnosis present

## 2011-03-10 DIAGNOSIS — I498 Other specified cardiac arrhythmias: Secondary | ICD-10-CM | POA: Diagnosis not present

## 2011-03-10 DIAGNOSIS — I4891 Unspecified atrial fibrillation: Secondary | ICD-10-CM

## 2011-03-10 DIAGNOSIS — E119 Type 2 diabetes mellitus without complications: Secondary | ICD-10-CM | POA: Diagnosis present

## 2011-03-10 DIAGNOSIS — Z79899 Other long term (current) drug therapy: Secondary | ICD-10-CM

## 2011-03-10 DIAGNOSIS — Z9861 Coronary angioplasty status: Secondary | ICD-10-CM

## 2011-03-10 DIAGNOSIS — I6529 Occlusion and stenosis of unspecified carotid artery: Secondary | ICD-10-CM | POA: Diagnosis present

## 2011-03-10 DIAGNOSIS — K560 Paralytic ileus: Secondary | ICD-10-CM | POA: Diagnosis not present

## 2011-03-10 DIAGNOSIS — D696 Thrombocytopenia, unspecified: Secondary | ICD-10-CM | POA: Diagnosis not present

## 2011-03-10 DIAGNOSIS — Z7982 Long term (current) use of aspirin: Secondary | ICD-10-CM

## 2011-03-10 DIAGNOSIS — E8779 Other fluid overload: Secondary | ICD-10-CM | POA: Diagnosis not present

## 2011-03-10 DIAGNOSIS — K279 Peptic ulcer, site unspecified, unspecified as acute or chronic, without hemorrhage or perforation: Secondary | ICD-10-CM | POA: Diagnosis present

## 2011-03-10 DIAGNOSIS — I359 Nonrheumatic aortic valve disorder, unspecified: Secondary | ICD-10-CM | POA: Diagnosis present

## 2011-03-10 DIAGNOSIS — Z9981 Dependence on supplemental oxygen: Secondary | ICD-10-CM

## 2011-03-10 DIAGNOSIS — E785 Hyperlipidemia, unspecified: Secondary | ICD-10-CM | POA: Diagnosis present

## 2011-03-10 DIAGNOSIS — I10 Essential (primary) hypertension: Secondary | ICD-10-CM | POA: Diagnosis present

## 2011-03-10 DIAGNOSIS — I350 Nonrheumatic aortic (valve) stenosis: Secondary | ICD-10-CM

## 2011-03-10 DIAGNOSIS — K5900 Constipation, unspecified: Secondary | ICD-10-CM | POA: Diagnosis not present

## 2011-03-10 HISTORY — DX: Encounter for other specified aftercare: Z51.89

## 2011-03-10 HISTORY — DX: Reserved for inherently not codable concepts without codable children: IMO0001

## 2011-03-10 HISTORY — DX: Gastro-esophageal reflux disease without esophagitis: K21.9

## 2011-03-10 LAB — URINALYSIS, ROUTINE W REFLEX MICROSCOPIC
Bilirubin Urine: NEGATIVE
Glucose, UA: NEGATIVE mg/dL
Hgb urine dipstick: NEGATIVE
Specific Gravity, Urine: <1.005 — ABNORMAL LOW (ref 1.005–1.030)
pH: 7.5 (ref 5.0–8.0)

## 2011-03-10 LAB — POCT I-STAT 3, ART BLOOD GAS (G3+)
O2 Saturation: 94 %
TCO2: 27 mmol/L (ref 0–100)

## 2011-03-10 LAB — POCT I-STAT 3, VENOUS BLOOD GAS (G3P V)
Acid-Base Excess: 1 mmol/L (ref 0.0–2.0)
Bicarbonate: 24.4 mEq/L — ABNORMAL HIGH (ref 20.0–24.0)
Bicarbonate: 26.9 mEq/L — ABNORMAL HIGH (ref 20.0–24.0)
O2 Saturation: 64 %
O2 Saturation: 65 %
TCO2: 26 mmol/L (ref 0–100)
pCO2, Ven: 48.3 mmHg (ref 45.0–50.0)
pH, Ven: 7.312 — ABNORMAL HIGH (ref 7.250–7.300)
pO2, Ven: 35 mmHg (ref 30.0–45.0)

## 2011-03-10 LAB — TSH: TSH: 1.473 u[IU]/mL (ref 0.350–4.500)

## 2011-03-10 SURGERY — JV LEFT AND RIGHT HEART CATHETERIZATION WITH CORONARY ANGIOGRAM
Anesthesia: Moderate Sedation

## 2011-03-10 MED ORDER — ONDANSETRON HCL 4 MG/2ML IJ SOLN: 4.0000 mg | Freq: Four times a day (QID) | INTRAMUSCULAR | Status: DC | PRN

## 2011-03-10 MED ORDER — ACETAMINOPHEN 325 MG PO TABS: 650.0000 mg | ORAL_TABLET | ORAL | Status: DC | PRN

## 2011-03-10 MED ORDER — ATORVASTATIN CALCIUM 40 MG PO TABS
40.0000 mg | ORAL_TABLET | Freq: Every day | ORAL | Status: DC
  Administered 2011-03-10 – 2011-03-17 (×7): 40 mg via ORAL
  Filled 2011-03-10 (×9): qty 1

## 2011-03-10 MED ORDER — ALPRAZOLAM 0.25 MG PO TABS
0.2500 mg | ORAL_TABLET | ORAL | Status: DC | PRN
  Administered 2011-03-10: 0.5 mg via ORAL
  Filled 2011-03-10: qty 2

## 2011-03-10 MED ORDER — ASPIRIN EC 81 MG PO TBEC
81.0000 mg | DELAYED_RELEASE_TABLET | Freq: Every day | ORAL | Status: DC
  Administered 2011-03-11: 81 mg via ORAL
  Filled 2011-03-10 (×2): qty 1

## 2011-03-10 MED ORDER — VERAPAMIL HCL 2.5 MG/ML IV SOLN
INTRAVENOUS | Status: DC
  Filled 2011-03-10: qty 2.5

## 2011-03-10 MED ORDER — METOPROLOL TARTRATE 50 MG PO TABS
50.0000 mg | ORAL_TABLET | Freq: Two times a day (BID) | ORAL | Status: DC
  Administered 2011-03-10 – 2011-03-11 (×4): 50 mg via ORAL
  Filled 2011-03-10 (×5): qty 1

## 2011-03-10 MED ORDER — VANCOMYCIN HCL 1000 MG IV SOLR
1250.0000 mg | INTRAVENOUS | Status: DC
  Filled 2011-03-10: qty 1250

## 2011-03-10 MED ORDER — SODIUM CHLORIDE 0.9 % IV SOLN
INTRAVENOUS | Status: DC
  Filled 2011-03-10: qty 40

## 2011-03-10 MED ORDER — SODIUM CHLORIDE 0.9 % IV SOLN
0.1000 ug/kg/h | INTRAVENOUS | Status: DC
  Filled 2011-03-10: qty 4

## 2011-03-10 MED ORDER — SODIUM CHLORIDE 0.9 % IJ SOLN
3.0000 mL | Freq: Two times a day (BID) | INTRAMUSCULAR | Status: DC
  Administered 2011-03-10: 3 mL via INTRAVENOUS

## 2011-03-10 MED ORDER — BISACODYL 5 MG PO TBEC
5.0000 mg | DELAYED_RELEASE_TABLET | Freq: Once | ORAL | Status: AC
  Administered 2011-03-11: 5 mg via ORAL
  Filled 2011-03-10 (×2): qty 1

## 2011-03-10 MED ORDER — DEXTROSE 5 % IV SOLN
1.5000 g | INTRAVENOUS | Status: DC
  Filled 2011-03-10: qty 1.5

## 2011-03-10 MED ORDER — NITROGLYCERIN IN D5W 200-5 MCG/ML-% IV SOLN: 2.0000 ug/min | INTRAVENOUS | Status: DC

## 2011-03-10 MED ORDER — SIMVASTATIN 40 MG PO TABS: 40.0000 mg | ORAL_TABLET | Freq: Every day | ORAL | Status: DC

## 2011-03-10 MED ORDER — CHLORHEXIDINE GLUCONATE 4 % EX LIQD: 60.0000 mL | Freq: Once | CUTANEOUS | Status: DC

## 2011-03-10 MED ORDER — AMLODIPINE BESYLATE 10 MG PO TABS
10.0000 mg | ORAL_TABLET | Freq: Every day | ORAL | Status: DC
  Administered 2011-03-10 – 2011-03-11 (×2): 10 mg via ORAL
  Filled 2011-03-10 (×3): qty 1

## 2011-03-10 MED ORDER — SODIUM CHLORIDE 0.9 % IV SOLN
INTRAVENOUS | Status: DC
  Filled 2011-03-10: qty 1

## 2011-03-10 MED ORDER — ADULT MULTIVITAMIN W/MINERALS CH
1.0000 | ORAL_TABLET | Freq: Every day | ORAL | Status: DC
  Administered 2011-03-11 – 2011-03-25 (×14): 1 via ORAL
  Filled 2011-03-10 (×15): qty 1

## 2011-03-10 MED ORDER — SODIUM CHLORIDE 0.9 % IV SOLN: 250.0000 mL | INTRAVENOUS | Status: DC

## 2011-03-10 MED ORDER — DIAZEPAM 5 MG PO TABS
5.0000 mg | ORAL_TABLET | ORAL | Status: AC
  Administered 2011-03-10: 5 mg via ORAL

## 2011-03-10 MED ORDER — PHENYLEPHRINE HCL 10 MG/ML IJ SOLN
30.0000 ug/min | INTRAVENOUS | Status: DC
  Filled 2011-03-10: qty 2

## 2011-03-10 MED ORDER — DEXTROSE 5 % IV SOLN
750.0000 mg | INTRAVENOUS | Status: DC
  Filled 2011-03-10: qty 750

## 2011-03-10 MED ORDER — AMLODIPINE BESY-BENAZEPRIL HCL 10-20 MG PO CAPS: 1.0000 | ORAL_CAPSULE | Freq: Every day | ORAL | Status: DC

## 2011-03-10 MED ORDER — METOPROLOL TARTRATE 12.5 MG HALF TABLET
12.5000 mg | ORAL_TABLET | Freq: Once | ORAL | Status: DC
  Filled 2011-03-10: qty 1

## 2011-03-10 MED ORDER — SODIUM CHLORIDE 0.9 % IV SOLN
INTRAVENOUS | Status: DC
  Administered 2011-03-10: 08:00:00 via INTRAVENOUS

## 2011-03-10 MED ORDER — ENOXAPARIN SODIUM 30 MG/0.3ML ~~LOC~~ SOLN
30.0000 mg | SUBCUTANEOUS | Status: DC
  Administered 2011-03-10 – 2011-03-11 (×2): 30 mg via SUBCUTANEOUS
  Filled 2011-03-10 (×3): qty 0.3

## 2011-03-10 MED ORDER — DOPAMINE-DEXTROSE 3.2-5 MG/ML-% IV SOLN: 2.0000 ug/kg/min | INTRAVENOUS | Status: DC

## 2011-03-10 MED ORDER — ASPIRIN 81 MG PO TABS: 81.0000 mg | ORAL_TABLET | Freq: Every day | ORAL | Status: DC

## 2011-03-10 MED ORDER — METOPROLOL TARTRATE 50 MG PO TABS: 50.0000 mg | ORAL_TABLET | Freq: Two times a day (BID) | ORAL | Status: DC

## 2011-03-10 MED ORDER — SODIUM CHLORIDE 0.9 % IV SOLN: INTRAVENOUS | Status: DC

## 2011-03-10 MED ORDER — EPINEPHRINE HCL 1 MG/ML IJ SOLN
0.5000 ug/min | INTRAVENOUS | Status: DC
  Filled 2011-03-10: qty 4

## 2011-03-10 MED ORDER — ASPIRIN 81 MG PO CHEW
324.0000 mg | CHEWABLE_TABLET | ORAL | Status: AC
  Administered 2011-03-10: 324 mg via ORAL

## 2011-03-10 MED ORDER — TEMAZEPAM 15 MG PO CAPS: 15.0000 mg | ORAL_CAPSULE | Freq: Once | ORAL | Status: AC | PRN

## 2011-03-10 MED ORDER — POTASSIUM CHLORIDE 2 MEQ/ML IV SOLN
80.0000 meq | INTRAVENOUS | Status: DC
  Filled 2011-03-10: qty 40

## 2011-03-10 MED ORDER — SODIUM CHLORIDE 0.9 % IJ SOLN: 3.0000 mL | INTRAMUSCULAR | Status: DC | PRN

## 2011-03-10 MED ORDER — ONE-DAILY MULTI VITAMINS PO TABS: 1.0000 | ORAL_TABLET | Freq: Every day | ORAL | Status: DC

## 2011-03-10 MED ORDER — BENAZEPRIL HCL 20 MG PO TABS
20.0000 mg | ORAL_TABLET | Freq: Every day | ORAL | Status: DC
  Administered 2011-03-10 – 2011-03-11 (×2): 20 mg via ORAL
  Filled 2011-03-10 (×3): qty 1

## 2011-03-10 MED ORDER — MAGNESIUM SULFATE 50 % IJ SOLN
40.0000 meq | INTRAMUSCULAR | Status: DC
  Filled 2011-03-10: qty 10

## 2011-03-10 NOTE — Progress Notes (Signed)
Cardiac Rehab We received order if PCI begin seeing. Pt did not have PCI so we will not see at this time. Please reorder [place] if you want [place] to see pt prior to surgery. Kamille Toomey DunlapRN

## 2011-03-10 NOTE — Op Note (Signed)
   Cardiac Catheterization Procedure Note  Name: Edward Pena MRN: [id number] DOB: 09-07-1926  Procedure: Right Heart Cath, Placement of catheters for angio without LHC, Selective Coronary Angiography, Aortic Root angiography  Indication: preop aortic stenosis   Procedural Details: The right groin was prepped, draped, and anesthetized with 1% lidocaine. Using the modified Seldinger technique a 5 French sheath was placed in the right femoral artery and a 7 French sheath was placed in the right femoral vein. A Swan-Ganz catheter was used for the right heart catheterization. Standard protocol was followed for recording of right heart pressures and sampling of oxygen saturations. Fick cardiac output was calculated. Standard Judkins catheters were used for selective coronary angiography and aortography. There were no immediate procedural complications. The patient was transferred to the post catheterization recovery area for further monitoring.  Procedural Findings: Hemodynamics RA 5 RV 27/6 PA 26/10 (16) PCWP 15 LV not done AO 136/58 (91)  Oxygen saturations: PA 64 SVC 65 AO 94  Cardiac Output (Fick) 4.5 L/min  Cardiac Index (Fick) 2.3 L/min/M2 Cardiac Output (Thermo)  4 L/min/M2 Cardiac Index (Thermo)  2 L/min/M2  Proximal root aortography demonstrated a heavily calcified aortic valve without much motion on fluoro.  The aortic root was not significantly enlarged.  There was approximately 2 plus AI.     Coronary angiography: Coronary dominance: right  Left mainstem: calcified proximally without significant obstruction, and a large caliber vessel  Left anterior descending (LAD): Calcified vessel with mild luminal irregularity throughout.  There are two stents in the LAD with less than 30% narrowing.  There is a 50% stenosis in the LAD between the stents.  There is 60% narrowing of the small first optional diagonal or ramus vessel.  The second diagonal has about 50% narrowing.  The  LAD wraps the apex.    Left circumflex (LCx): The circumflex has two large OM vessels.  The OM 1 has 70% proximally narrowing and the distal vessel is suitable for grafting  Right coronary artery (RCA): The RCA is calcified and appears to be previously stented as well.  There is mild luminal irregularity throughout without significant focal narrowing.  There is a modest sized PDA with 30-40% mid narrowing and smaller branches constitute the PLA system.      Final Conclusions:    1.  Patent stents in the mid LAD with other findings as noted. 2.  Patent RCA stents 3.  Moderately high grade stenosis in the proximal OM 1 4.  Moderate AI 5.  Severe AS by echo   Recommendations:  1.  Admit for AVR plus prob CABG    [name] 03/10/2011, 9:04 AM

## 2011-03-10 NOTE — Progress Notes (Signed)
Bedrest begins @ 0920.  Tegaderm dressing applied.

## 2011-03-10 NOTE — H&P (View-Only) (Signed)
This is an addendum to the original complete note on 2/1. Mr. Frankson was evaluated by Dr. Van Trigt last week, and following discussion, plan is for him to undergo aortic valve replacement later this week secondary to progressive, severe aortic valve stenosis. There has otherwise been no major change in clinical status. Mr. Edgin is being scheduled for a cardiac catheterization on 2/26 with Dr. Stuckey -  left and right heart catheterization although no clear need to cross valve based on recent echocardiogram. Per discussion with Dr. Van Trigt, plan is for the patient to then be admitted to the hospital after catheterization in anticipation of surgery on Thursday. This note with addendum can serve as the history and physical. Admission orders to be completed by hospital cardiology extender on 2/26, once information from cardiac catheterization has been obtained. Please also inform  TCTS service of patient's admission.  Galen Malkowski G. Tobie Perdue, M.D., F.A.C.C.  

## 2011-03-10 NOTE — Progress Notes (Signed)
The patient is an 78 year old male with history of aortic stenosis followed with serial  echocardiograms. Recently he has developed significant shortness of breath with exertion and his most recent echo shows significant transvalvular gradient consistent with severe aortic stenosis. He was admitted today after left and right heart cath. The aortic valve was not crossed. Previously 2-D echo shows good LV function.  Today PA pressure was 26/10, cardiac output 4 L per minute, mixed venous saturation 64%, 2+ aortic insufficiency.  He has had previous stents to the LAD and right coronary. The right coronary stent is patent. The LAD stents have a interval 60% stenosis of the mid vessel between the 2 stents. The OM1 has a 70% stenosis at its origin.  Plan the patient repaired for aortic valve replacement with a bioprosthetic valve and combined bypass grafts to the LAD and OM on February 28. We'll obtain preop carotid Dopplers, PFTs, dental x-rays, and followup creatinine following the dye load.

## 2011-03-10 NOTE — Progress Notes (Signed)
Report called to [facility] on 3700, transported there via stretcher and monitor.

## 2011-03-10 NOTE — Progress Notes (Addendum)
Orders transcribed from patient's outpatient chart from [facility] lab as ordered by Dr. Riley Kill, except Lipitor replaces Zocor given Norvasc. Per discussion with Dr. Riley Kill, we will hold Lasix (and thus KCl since he takes it with Lasix). Will need to resume Lasix and dvt ppx when felt appropriate.  Will also hold off on order to progress to hall ambulation until felt stable by MD given aortic stenosis and CAD.   Houda Brau PA-C

## 2011-03-10 NOTE — Interval H&P Note (Signed)
History and Physical Interval Note:  03/10/2011 8:05 AM  Edward Pena  has presented today for surgery, with the diagnosis of AS  The various methods of treatment have been discussed with the patient and family. After consideration of risks, benefits and other options for treatment, the patient has consented to  Procedure(s) (LRB): [facility] LEFT AND RIGHT HEART CATHETERIZATION WITH CORONARY ANGIOGRAM (N/A) as a surgical intervention .  The patients' history has been reviewed, patient examined, no change in status, stable for surgery.  I have reviewed the patients' chart and labs.  Questions were answered to the patient's satisfaction.   Prior data, examination, allergies, potential complications all reviewed in detail.        Edward Pena

## 2011-03-11 ENCOUNTER — Inpatient Hospital Stay (HOSPITAL_COMMUNITY): Payer: Medicare Other

## 2011-03-11 ENCOUNTER — Other Ambulatory Visit: Payer: Self-pay

## 2011-03-11 DIAGNOSIS — I2510 Atherosclerotic heart disease of native coronary artery without angina pectoris: Secondary | ICD-10-CM

## 2011-03-11 DIAGNOSIS — I359 Nonrheumatic aortic valve disorder, unspecified: Secondary | ICD-10-CM

## 2011-03-11 DIAGNOSIS — Z01810 Encounter for preprocedural cardiovascular examination: Secondary | ICD-10-CM

## 2011-03-11 LAB — PULMONARY FUNCTION TEST

## 2011-03-11 LAB — CBC
HCT: 36.8 % — ABNORMAL LOW (ref 39.0–52.0)
Hemoglobin: 12.7 g/dL — ABNORMAL LOW (ref 13.0–17.0)
MCH: 33.7 pg (ref 26.0–34.0)
MCHC: 34.5 g/dL (ref 30.0–36.0)
MCV: 97.6 fL (ref 78.0–100.0)
Platelets: 196 10*3/uL (ref 150–400)
RBC: 3.77 MIL/uL — ABNORMAL LOW (ref 4.22–5.81)
RDW: 12 % (ref 11.5–15.5)
WBC: 5.7 10*3/uL (ref 4.0–10.5)

## 2011-03-11 LAB — COMPREHENSIVE METABOLIC PANEL
ALT: 16 U/L (ref 0–53)
AST: 17 U/L (ref 0–37)
Albumin: 3.2 g/dL — ABNORMAL LOW (ref 3.5–5.2)
Alkaline Phosphatase: 62 U/L (ref 39–117)
BUN: 12 mg/dL (ref 6–23)
CO2: 27 mEq/L (ref 19–32)
Calcium: 9 mg/dL (ref 8.4–10.5)
Chloride: 98 mEq/L (ref 96–112)
Creatinine, Ser: 1.02 mg/dL (ref 0.50–1.35)
GFR calc Af Amer: 76 mL/min — ABNORMAL LOW (ref >90)
GFR calc non Af Amer: 66 mL/min — ABNORMAL LOW (ref >90)
Glucose, Bld: 104 mg/dL — ABNORMAL HIGH (ref 70–99)
Potassium: 4.3 mEq/L (ref 3.5–5.1)
Sodium: 132 mEq/L — ABNORMAL LOW (ref 135–145)
Total Bilirubin: 0.4 mg/dL (ref 0.3–1.2)
Total Protein: 6.7 g/dL (ref 6.0–8.3)

## 2011-03-11 LAB — HEMOGLOBIN A1C
Hgb A1c MFr Bld: 5.7 % — ABNORMAL HIGH (ref <5.7)
Mean Plasma Glucose: 117 mg/dL — ABNORMAL HIGH (ref <117)

## 2011-03-11 LAB — PREPARE RBC (CROSSMATCH)

## 2011-03-11 MED ORDER — POTASSIUM CHLORIDE 2 MEQ/ML IV SOLN
80.0000 meq | INTRAVENOUS | Status: DC
  Filled 2011-03-11: qty 40

## 2011-03-11 MED ORDER — MAGNESIUM SULFATE 50 % IJ SOLN
40.0000 meq | INTRAMUSCULAR | Status: DC
  Filled 2011-03-11: qty 10

## 2011-03-11 MED ORDER — EPINEPHRINE HCL 1 MG/ML IJ SOLN
0.5000 ug/min | INTRAVENOUS | Status: DC
  Filled 2011-03-11: qty 4

## 2011-03-11 MED ORDER — CHLORHEXIDINE GLUCONATE 4 % EX LIQD
60.0000 mL | Freq: Once | CUTANEOUS | Status: AC
  Administered 2011-03-12: 4 via TOPICAL
  Filled 2011-03-11: qty 60

## 2011-03-11 MED ORDER — PHENYLEPHRINE HCL 10 MG/ML IJ SOLN
30.0000 ug/min | INTRAVENOUS | Status: DC
  Filled 2011-03-11: qty 2

## 2011-03-11 MED ORDER — METOPROLOL TARTRATE 12.5 MG HALF TABLET
12.5000 mg | ORAL_TABLET | Freq: Once | ORAL | Status: DC
  Filled 2011-03-11: qty 1

## 2011-03-11 MED ORDER — CHLORHEXIDINE GLUCONATE 4 % EX LIQD
60.0000 mL | Freq: Once | CUTANEOUS | Status: AC
  Administered 2011-03-11: 4 via TOPICAL

## 2011-03-11 MED ORDER — SODIUM CHLORIDE 0.9 % IV SOLN
INTRAVENOUS | Status: DC
  Filled 2011-03-11: qty 40

## 2011-03-11 MED ORDER — SODIUM CHLORIDE 0.9 % IV SOLN
INTRAVENOUS | Status: DC
  Filled 2011-03-11: qty 1

## 2011-03-11 MED ORDER — VANCOMYCIN HCL 1000 MG IV SOLR
1250.0000 mg | INTRAVENOUS | Status: AC
  Administered 2011-03-12: 1250 mg via INTRAVENOUS
  Filled 2011-03-11 (×2): qty 1250

## 2011-03-11 MED ORDER — DEXMEDETOMIDINE HCL 100 MCG/ML IV SOLN
0.1000 ug/kg/h | INTRAVENOUS | Status: DC
  Filled 2011-03-11: qty 4

## 2011-03-11 MED ORDER — VERAPAMIL HCL 2.5 MG/ML IV SOLN
INTRAVENOUS | Status: AC
  Administered 2011-03-12: 10:00:00
  Filled 2011-03-11: qty 2.5

## 2011-03-11 MED ORDER — NITROGLYCERIN IN D5W 200-5 MCG/ML-% IV SOLN
2.0000 ug/min | INTRAVENOUS | Status: AC
  Administered 2011-03-12: 16.6 ug/min via INTRAVENOUS
  Filled 2011-03-11: qty 250

## 2011-03-11 MED ORDER — ALBUTEROL SULFATE (5 MG/ML) 0.5% IN NEBU
2.5000 mg | INHALATION_SOLUTION | Freq: Once | RESPIRATORY_TRACT | Status: AC
  Administered 2011-03-11: 2.5 mg via RESPIRATORY_TRACT

## 2011-03-11 MED ORDER — DOPAMINE-DEXTROSE 3.2-5 MG/ML-% IV SOLN
2.0000 ug/kg/min | INTRAVENOUS | Status: DC
  Filled 2011-03-11: qty 250

## 2011-03-11 MED ORDER — DEXTROSE 5 % IV SOLN
750.0000 mg | INTRAVENOUS | Status: DC
  Filled 2011-03-11 (×2): qty 750

## 2011-03-11 MED ORDER — DEXTROSE 5 % IV SOLN
1.5000 g | INTRAVENOUS | Status: AC
  Administered 2011-03-12: 1.5 g via INTRAVENOUS
  Administered 2011-03-12: .75 g via INTRAVENOUS
  Filled 2011-03-11: qty 1.5

## 2011-03-11 NOTE — Progress Notes (Addendum)
VASCULAR LAB PRELIMINARY  PRELIMINARY  PRELIMINARY  PRELIMINARY  Pre-op Cardiac Surgery  Carotid Findings:  No evidence of significant ICA stenosis bilaterally.  Palpable pedal pulses x 4.  Upper Extremity Right Left  Brachial Pressures 149 T 150 T  Radial Waveforms T T  Ulnar Waveforms T T  Palmar Arch (Allen's Test) Decreases greater than 50% with ulnar compression. Decreases greater than 50% with ulnar compression.     Smiley Houseman, 03/11/2011, 1:13 PM

## 2011-03-11 NOTE — Progress Notes (Signed)
Subjective:  Doing well per patients wife.  For surgery tomorrow.    Objective:  Vital Signs in the last 24 hours: Temp:  [97.5 F (36.4 C)-98.3 F (36.8 C)] 97.5 F (36.4 C) (02/27 0500) Pulse Rate:  [64-92] 64  (02/27 0500) Resp:  [16-20] 20  (02/27 0500) BP: (137-179)/(50-76) 151/50 mmHg (02/27 0500) SpO2:  [95 %-98 %] 96 % (02/27 0500) Weight:  [183 lb 12.8 oz (83.371 kg)] 183 lb 12.8 oz (83.371 kg) (02/26 1100)  Intake/Output from previous day: 02/26 0701 - 02/27 0700 In: 360 [P.O.:360] Out: 500 [Urine:500]   Physical Exam: General: Well developed, well nourished, in no acute distress. Head:  Normocephalic and atraumatic. Lungs: Clear to auscultation and percussion. Heart: Normal S1 and S2. SEM.   Pulses: Pulses normal in all 4 extremities. Extremities: No clubbing or cyanosis. No edema. Neurologic: Alert and oriented x 3.    Lab Results:  [facility] 03/11/11 0605  WBC 5.7  HGB 12.7*  PLT 196    Basename 03/11/11 0605  NA 132*  K 4.3  CL 98  CO2 27  GLUCOSE 104*  BUN 12  CREATININE 1.02   No results found for this basename: TROPONINI:2,CK,MB:2 in the last 72 hours Hepatic Function Panel  Basename 03/11/11 0605  PROT 6.7  ALBUMIN 3.2*  AST 17  ALT 16  ALKPHOS 62  BILITOT 0.4  BILIDIR --  IBILI --   No results found for this basename: CHOL in the last 72 hours No results found for this basename: PROTIME in the last 72 hours  Imaging: Dg Orthopantogram  03/11/2011  *RADIOLOGY REPORT*  Clinical Data: Assess dental disease preop  ORTHOPANTOGRAM/PANORAMIC  Comparison: None.  Findings: Multiple missing teeth.  Restoration in tooth number 20. No definite caries   or periapical lucency to suggest abscess. There is technical blur artifact in the incisor region however. Temporomandibular joints appear seated.  IMPRESSION:  1.  Multiple missing teeth without acute process.  Consider dental films for complete evaluation.  Original Report Authenticated By: Osa Craver, M.D.   Dg Chest 2 View  03/11/2011  *RADIOLOGY REPORT*  Clinical Data: Chest soreness, post catheterization  CHEST - 2 VIEW  Comparison: Chest radiograph 03/09/2011  Findings: Normal cardiac silhouette.  Lungs are hyperinflated.  No effusion, infiltrate, or pneumothorax. Degenerative osteophytosis of the thoracic spine.  IMPRESSION: No acute cardiopulmonary process.  Emphysematous change.  Original Report Authenticated By: Genevive Bi, M.D.      Assessment/Plan:  CAD--for CABG with AVR tomorrow. AS-  For AVR tomorrow.  Preop studies pending Hyperlipidemia--was on amlodipine and simva--need to change to lipitor.    I/O neg from yesterday.  On furosemide at home.  Will hold until after surgery since cath was yesterday.  Not getting fluids.            [name], MD, [facility], FSCAI 03/11/2011, 9:54 AM

## 2011-03-11 NOTE — Progress Notes (Signed)
Pts wife to stay at home with [facility] in [place]. Security to transport pts wife twice a day to and from hospital. Information handed to wife. Mardelle Matte questions contact [employer]. Pts wife to stay Thursday and Friday night. Pt to contact security.

## 2011-03-11 NOTE — Progress Notes (Signed)
PFT completed. Preliminary results in Progress Notes in Shadow Chart.

## 2011-03-11 NOTE — Progress Notes (Signed)
Noticed rhythm change on montiors. EKG obtained to confirm. Norco paged and Cards Fellow notified. Confirmed 2nd Degree HB Type 1. No new orders given. Patient is asymptomatic with HR 65 and BP 133/56. Will continue to monitor.  Harless Litten, RN 03/11/11

## 2011-03-12 ENCOUNTER — Encounter (HOSPITAL_COMMUNITY): Payer: Self-pay | Admitting: Anesthesiology

## 2011-03-12 ENCOUNTER — Ambulatory Visit (HOSPITAL_COMMUNITY): Admission: RE | Admit: 2011-03-12 | Payer: Medicare Other | Source: Ambulatory Visit | Admitting: Cardiothoracic Surgery

## 2011-03-12 ENCOUNTER — Encounter (HOSPITAL_COMMUNITY)
Admission: AD | Disposition: A | Discharge status: Home / Self Care | Payer: Self-pay | Source: Ambulatory Visit | Attending: Cardiothoracic Surgery

## 2011-03-12 ENCOUNTER — Inpatient Hospital Stay (HOSPITAL_COMMUNITY): Payer: Medicare Other

## 2011-03-12 ENCOUNTER — Inpatient Hospital Stay (HOSPITAL_COMMUNITY): Payer: Medicare Other | Admitting: Anesthesiology

## 2011-03-12 DIAGNOSIS — I359 Nonrheumatic aortic valve disorder, unspecified: Secondary | ICD-10-CM

## 2011-03-12 DIAGNOSIS — I2510 Atherosclerotic heart disease of native coronary artery without angina pectoris: Secondary | ICD-10-CM

## 2011-03-12 HISTORY — PX: AORTIC VALVE REPLACEMENT: SHX41

## 2011-03-12 HISTORY — PX: CORONARY ARTERY BYPASS GRAFT: SHX141

## 2011-03-12 LAB — HEMOGLOBIN AND HEMATOCRIT, BLOOD
HCT: 25 % — ABNORMAL LOW (ref 39.0–52.0)
Hemoglobin: 9 g/dL — ABNORMAL LOW (ref 13.0–17.0)

## 2011-03-12 LAB — POCT I-STAT 7, (LYTES, BLD GAS, ICA,H+H)
Acid-base deficit: 3 mmol/L — ABNORMAL HIGH (ref 0.0–2.0)
Bicarbonate: 23.2 mEq/L (ref 20.0–24.0)
HCT: 27 % — ABNORMAL LOW (ref 39.0–52.0)
Hemoglobin: 9.2 g/dL — ABNORMAL LOW (ref 13.0–17.0)
Patient temperature: 36.6
pH, Arterial: 7.338 — ABNORMAL LOW (ref 7.350–7.450)
pO2, Arterial: 104 mmHg — ABNORMAL HIGH (ref 80.0–100.0)

## 2011-03-12 LAB — MAGNESIUM: Magnesium: 2.5 mg/dL (ref 1.5–2.5)

## 2011-03-12 LAB — CBC
HCT: 27.4 % — ABNORMAL LOW (ref 39.0–52.0)
Hemoglobin: 10 g/dL — ABNORMAL LOW (ref 13.0–17.0)
MCH: 33.1 pg (ref 26.0–34.0)
MCH: 34.1 pg — ABNORMAL HIGH (ref 26.0–34.0)
MCHC: 36.5 g/dL — ABNORMAL HIGH (ref 30.0–36.0)
MCV: 94.6 fL (ref 78.0–100.0)
MCV: 93.5 fL (ref 78.0–100.0)
Platelets: 104 10*3/uL — ABNORMAL LOW (ref 150–400)
Platelets: 101 10*3/uL — ABNORMAL LOW (ref 150–400)
RBC: 2.93 MIL/uL — ABNORMAL LOW (ref 4.22–5.81)
RDW: 12.6 % (ref 11.5–15.5)
RDW: 13.1 % (ref 11.5–15.5)
WBC: 8 10*3/uL (ref 4.0–10.5)

## 2011-03-12 LAB — POCT I-STAT 4, (NA,K, GLUC, HGB,HCT)
Glucose, Bld: 113 mg/dL — ABNORMAL HIGH (ref 70–99)
Glucose, Bld: 94 mg/dL (ref 70–99)
HCT: 36 % — ABNORMAL LOW (ref 39.0–52.0)
HCT: 22 % — ABNORMAL LOW (ref 39.0–52.0)
HCT: 24 % — ABNORMAL LOW (ref 39.0–52.0)
HCT: 22 % — ABNORMAL LOW (ref 39.0–52.0)
Hemoglobin: 12.2 g/dL — ABNORMAL LOW (ref 13.0–17.0)
Hemoglobin: 7.5 g/dL — ABNORMAL LOW (ref 13.0–17.0)
Hemoglobin: 7.5 g/dL — ABNORMAL LOW (ref 13.0–17.0)
Hemoglobin: 9.2 g/dL — ABNORMAL LOW (ref 13.0–17.0)
Potassium: 4.1 mEq/L (ref 3.5–5.1)
Potassium: 4.3 mEq/L (ref 3.5–5.1)
Potassium: 4.4 mEq/L (ref 3.5–5.1)
Sodium: 133 mEq/L — ABNORMAL LOW (ref 135–145)
Sodium: 133 mEq/L — ABNORMAL LOW (ref 135–145)
Sodium: 134 mEq/L — ABNORMAL LOW (ref 135–145)
Sodium: 137 mEq/L (ref 135–145)

## 2011-03-12 LAB — POCT I-STAT 3, ART BLOOD GAS (G3+)
Bicarbonate: 22 mEq/L (ref 20.0–24.0)
O2 Saturation: 100 %
Patient temperature: 34.4
TCO2: 23 mmol/L (ref 0–100)
TCO2: 24 mmol/L (ref 0–100)
pCO2 arterial: 40.7 mmHg (ref 35.0–45.0)
pH, Arterial: 7.278 — ABNORMAL LOW (ref 7.350–7.450)
pO2, Arterial: 266 mmHg — ABNORMAL HIGH (ref 80.0–100.0)
pO2, Arterial: 310 mmHg — ABNORMAL HIGH (ref 80.0–100.0)

## 2011-03-12 LAB — GLUCOSE, CAPILLARY
Glucose-Capillary: 115 mg/dL — ABNORMAL HIGH (ref 70–99)
Glucose-Capillary: 120 mg/dL — ABNORMAL HIGH (ref 70–99)
Glucose-Capillary: 118 mg/dL — ABNORMAL HIGH (ref 70–99)
Glucose-Capillary: 118 mg/dL — ABNORMAL HIGH (ref 70–99)
Glucose-Capillary: 150 mg/dL — ABNORMAL HIGH (ref 70–99)

## 2011-03-12 LAB — APTT: aPTT: 34 seconds (ref 24–37)

## 2011-03-12 LAB — PROTIME-INR: Prothrombin Time: 13.4 seconds (ref 11.6–15.2)

## 2011-03-12 LAB — PLATELET COUNT: Platelets: 57 10*3/uL — ABNORMAL LOW (ref 150–400)

## 2011-03-12 LAB — POCT I-STAT, CHEM 8
Calcium, Ion: 1.08 mmol/L — ABNORMAL LOW (ref 1.12–1.32)
HCT: 27 % — ABNORMAL LOW (ref 39.0–52.0)
Hemoglobin: 9.2 g/dL — ABNORMAL LOW (ref 13.0–17.0)
Sodium: 136 mEq/L (ref 135–145)
TCO2: 23 mmol/L (ref 0–100)

## 2011-03-12 LAB — CREATININE, SERUM
Creatinine, Ser: 0.73 mg/dL (ref 0.50–1.35)
GFR calc Af Amer: >90 mL/min (ref >90)
GFR calc non Af Amer: 83 mL/min — ABNORMAL LOW (ref >90)

## 2011-03-12 SURGERY — REPLACEMENT, AORTIC VALVE, OPEN
Anesthesia: General | Site: Chest | Wound class: Clean

## 2011-03-12 MED ORDER — DEXTROSE 5 % IV SOLN
30.0000 ug/min | INTRAVENOUS | Status: DC
  Filled 2011-03-12: qty 2

## 2011-03-12 MED ORDER — VANCOMYCIN HCL 1000 MG IV SOLR
1000.0000 mg | Freq: Once | INTRAVENOUS | Status: AC
  Administered 2011-03-12: 1000 mg via INTRAVENOUS
  Filled 2011-03-12 (×2): qty 1000

## 2011-03-12 MED ORDER — MILRINONE IN DEXTROSE 200-5 MCG/ML-% IV SOLN
0.2500 ug/kg/min | INTRAVENOUS | Status: DC
  Filled 2011-03-12: qty 100

## 2011-03-12 MED ORDER — SODIUM CHLORIDE 0.9 % IV SOLN
INTRAVENOUS | Status: DC
  Administered 2011-03-12: 20 mL via INTRAVENOUS

## 2011-03-12 MED ORDER — PROPOFOL 10 MG/ML IV EMUL
INTRAVENOUS | Status: DC | PRN
  Administered 2011-03-12: 50 mg via INTRAVENOUS

## 2011-03-12 MED ORDER — METOPROLOL TARTRATE 1 MG/ML IV SOLN: 2.5000 mg | INTRAVENOUS | Status: DC | PRN

## 2011-03-12 MED ORDER — PROTAMINE SULFATE 10 MG/ML IV SOLN
INTRAVENOUS | Status: DC | PRN
  Administered 2011-03-12: 240 mg via INTRAVENOUS
  Administered 2011-03-12: 50 mg via INTRAVENOUS
  Administered 2011-03-12: 10 mg via INTRAVENOUS

## 2011-03-12 MED ORDER — ACETAMINOPHEN 160 MG/5ML PO SOLN: 650.0000 mg | ORAL | Status: AC

## 2011-03-12 MED ORDER — 0.9 % SODIUM CHLORIDE (POUR BTL) OPTIME
TOPICAL | Status: DC | PRN
  Administered 2011-03-12: 6000 mL

## 2011-03-12 MED ORDER — SODIUM CHLORIDE 0.9 % IJ SOLN
3.0000 mL | Freq: Two times a day (BID) | INTRAMUSCULAR | Status: DC
  Administered 2011-03-13 – 2011-03-14 (×2): 3 mL via INTRAVENOUS

## 2011-03-12 MED ORDER — INSULIN REGULAR BOLUS VIA INFUSION
0.0000 [IU] | Freq: Three times a day (TID) | INTRAVENOUS | Status: DC
  Filled 2011-03-12: qty 10

## 2011-03-12 MED ORDER — BISACODYL 10 MG RE SUPP: 10.0000 mg | Freq: Every day | RECTAL | Status: DC

## 2011-03-12 MED ORDER — DEXTROSE 5 % IV SOLN
1.5000 g | Freq: Two times a day (BID) | INTRAVENOUS | Status: DC
  Administered 2011-03-12 – 2011-03-14 (×4): 1.5 g via INTRAVENOUS
  Filled 2011-03-12 (×5): qty 1.5

## 2011-03-12 MED ORDER — ALBUMIN HUMAN 5 % IV SOLN: 250.0000 mL | INTRAVENOUS | Status: AC | PRN

## 2011-03-12 MED ORDER — ACETAMINOPHEN 650 MG RE SUPP
650.0000 mg | RECTAL | Status: AC
  Administered 2011-03-12: 650 mg via RECTAL

## 2011-03-12 MED ORDER — SODIUM CHLORIDE 0.9 % IV SOLN
INTRAVENOUS | Status: DC | PRN
  Administered 2011-03-12: 14:00:00 via INTRAVENOUS

## 2011-03-12 MED ORDER — MIDAZOLAM HCL 5 MG/5ML IJ SOLN
INTRAMUSCULAR | Status: DC | PRN
  Administered 2011-03-12: 2 mg via INTRAVENOUS
  Administered 2011-03-12: 4 mg via INTRAVENOUS
  Administered 2011-03-12: 6 mg via INTRAVENOUS

## 2011-03-12 MED ORDER — LACTATED RINGERS IV SOLN
INTRAVENOUS | Status: DC
  Administered 2011-03-14: 02:00:00 via INTRAVENOUS

## 2011-03-12 MED ORDER — BISACODYL 5 MG PO TBEC
10.0000 mg | DELAYED_RELEASE_TABLET | Freq: Every day | ORAL | Status: DC
  Administered 2011-03-13: 10 mg via ORAL
  Filled 2011-03-12: qty 2

## 2011-03-12 MED ORDER — HEMOSTATIC AGENTS (NO CHARGE) OPTIME
TOPICAL | Status: DC | PRN
  Administered 2011-03-12: 2 via TOPICAL

## 2011-03-12 MED ORDER — SODIUM CHLORIDE 0.45 % IV SOLN: INTRAVENOUS | Status: DC

## 2011-03-12 MED ORDER — SODIUM CHLORIDE 0.9 % IV SOLN
INTRAVENOUS | Status: DC
  Filled 2011-03-12: qty 1

## 2011-03-12 MED ORDER — LACTATED RINGERS IV SOLN
INTRAVENOUS | Status: DC | PRN
  Administered 2011-03-12 (×4): via INTRAVENOUS

## 2011-03-12 MED ORDER — ROCURONIUM BROMIDE 100 MG/10ML IV SOLN
INTRAVENOUS | Status: DC | PRN
  Administered 2011-03-12: 40 mg via INTRAVENOUS
  Administered 2011-03-12: 30 mg via INTRAVENOUS
  Administered 2011-03-12: 60 mg via INTRAVENOUS
  Administered 2011-03-12: 70 mg via INTRAVENOUS

## 2011-03-12 MED ORDER — SODIUM CHLORIDE 0.9 % IV SOLN
10.0000 g | INTRAVENOUS | Status: DC | PRN
  Administered 2011-03-12: 5 g/h via INTRAVENOUS

## 2011-03-12 MED ORDER — FENTANYL CITRATE 0.05 MG/ML IJ SOLN
INTRAMUSCULAR | Status: DC | PRN
  Administered 2011-03-12: 250 ug via INTRAVENOUS
  Administered 2011-03-12: 450 ug via INTRAVENOUS
  Administered 2011-03-12: 50 ug via INTRAVENOUS
  Administered 2011-03-12 (×2): 250 ug via INTRAVENOUS

## 2011-03-12 MED ORDER — MORPHINE SULFATE 4 MG/ML IJ SOLN
2.0000 mg | INTRAMUSCULAR | Status: DC | PRN
  Administered 2011-03-12: 4 mg via INTRAVENOUS
  Administered 2011-03-13 (×2): 2 mg via INTRAVENOUS
  Administered 2011-03-13: 4 mg via INTRAVENOUS
  Administered 2011-03-14 (×2): 2 mg via INTRAVENOUS
  Filled 2011-03-12 (×7): qty 1

## 2011-03-12 MED ORDER — SODIUM CHLORIDE 0.9 % IJ SOLN
OROMUCOSAL | Status: DC | PRN
  Administered 2011-03-12: 10:00:00 via TOPICAL

## 2011-03-12 MED ORDER — FAMOTIDINE IN NACL 20-0.9 MG/50ML-% IV SOLN
20.0000 mg | Freq: Two times a day (BID) | INTRAVENOUS | Status: AC
  Administered 2011-03-12: 20 mg via INTRAVENOUS

## 2011-03-12 MED ORDER — ACETAMINOPHEN 500 MG PO TABS
1000.0000 mg | ORAL_TABLET | Freq: Four times a day (QID) | ORAL | Status: AC
  Administered 2011-03-13 – 2011-03-15 (×8): 1000 mg via ORAL
  Filled 2011-03-12 (×18): qty 2

## 2011-03-12 MED ORDER — ACETAMINOPHEN 160 MG/5ML PO SOLN
975.0000 mg | Freq: Four times a day (QID) | ORAL | Status: DC
  Filled 2011-03-12: qty 40.6

## 2011-03-12 MED ORDER — HETASTARCH-ELECTROLYTES 6 % IV SOLN
INTRAVENOUS | Status: DC | PRN
  Administered 2011-03-12: 14:00:00 via INTRAVENOUS

## 2011-03-12 MED ORDER — NITROGLYCERIN IN D5W 200-5 MCG/ML-% IV SOLN: 0.0000 ug/min | INTRAVENOUS | Status: DC

## 2011-03-12 MED ORDER — MAGNESIUM SULFATE 40 MG/ML IJ SOLN
4.0000 g | Freq: Once | INTRAMUSCULAR | Status: AC
  Administered 2011-03-12: 4 g via INTRAVENOUS
  Filled 2011-03-12: qty 100

## 2011-03-12 MED ORDER — POTASSIUM CHLORIDE 10 MEQ/50ML IV SOLN
10.0000 meq | INTRAVENOUS | Status: AC
Start: 2011-03-12 — End: 2011-03-12

## 2011-03-12 MED ORDER — DEXTROSE 5 % IV SOLN
INTRAVENOUS | Status: DC | PRN
  Administered 2011-03-12 (×2): via INTRAVENOUS

## 2011-03-12 MED ORDER — PHENYLEPHRINE HCL 10 MG/ML IJ SOLN
0.0000 ug/min | INTRAVENOUS | Status: DC
  Filled 2011-03-12: qty 2

## 2011-03-12 MED ORDER — OXYCODONE HCL 5 MG PO TABS: 5.0000 mg | ORAL_TABLET | ORAL | Status: DC | PRN

## 2011-03-12 MED ORDER — MIDAZOLAM HCL 2 MG/2ML IJ SOLN
2.0000 mg | INTRAMUSCULAR | Status: DC | PRN
  Administered 2011-03-12 (×2): 2 mg via INTRAVENOUS
  Filled 2011-03-12 (×2): qty 2

## 2011-03-12 MED ORDER — CALCIUM CHLORIDE 10 % IV SOLN
INTRAVENOUS | Status: DC | PRN
  Administered 2011-03-12 (×2): .2 g via INTRAVENOUS

## 2011-03-12 MED ORDER — ASPIRIN 81 MG PO CHEW: 324.0000 mg | CHEWABLE_TABLET | Freq: Every day | ORAL | Status: DC

## 2011-03-12 MED ORDER — HEPARIN SODIUM (PORCINE) 1000 UNIT/ML IJ SOLN
INTRAMUSCULAR | Status: DC | PRN
  Administered 2011-03-12: 18000 [IU] via INTRAVENOUS
  Administered 2011-03-12: 2000 [IU] via INTRAVENOUS
  Administered 2011-03-12: 5000 [IU] via INTRAVENOUS

## 2011-03-12 MED ORDER — LACTATED RINGERS IV SOLN
INTRAVENOUS | Status: DC | PRN
  Administered 2011-03-12 (×2): via INTRAVENOUS

## 2011-03-12 MED ORDER — MORPHINE SULFATE 2 MG/ML IJ SOLN
1.0000 mg | INTRAMUSCULAR | Status: AC | PRN
  Administered 2011-03-12: 2 mg via INTRAVENOUS
  Filled 2011-03-12: qty 1

## 2011-03-12 MED ORDER — SODIUM CHLORIDE 0.9 % IV SOLN
INTRAVENOUS | Status: DC
  Filled 2011-03-12: qty 40

## 2011-03-12 MED ORDER — DOCUSATE SODIUM 100 MG PO CAPS
200.0000 mg | ORAL_CAPSULE | Freq: Every day | ORAL | Status: DC
  Administered 2011-03-13: 200 mg via ORAL
  Filled 2011-03-12: qty 2

## 2011-03-12 MED ORDER — SODIUM CHLORIDE 0.9 % IV SOLN
100.0000 [IU] | INTRAVENOUS | Status: DC | PRN
  Administered 2011-03-12: 1.6 [IU]/h via INTRAVENOUS

## 2011-03-12 MED ORDER — FAMOTIDINE IN NACL 20-0.9 MG/50ML-% IV SOLN: 20.0000 mg | Freq: Two times a day (BID) | INTRAVENOUS | Status: DC

## 2011-03-12 MED ORDER — METOPROLOL TARTRATE 25 MG/10 ML ORAL SUSPENSION
12.5000 mg | Freq: Two times a day (BID) | ORAL | Status: DC
  Filled 2011-03-12 (×3): qty 5

## 2011-03-12 MED ORDER — METHYLPREDNISOLONE SODIUM SUCC 125 MG IJ SOLR
INTRAMUSCULAR | Status: DC | PRN
  Administered 2011-03-12: 125 mg via INTRAVENOUS

## 2011-03-12 MED ORDER — LACTATED RINGERS IV SOLN: 500.0000 mL | Freq: Once | INTRAVENOUS | Status: AC | PRN

## 2011-03-12 MED ORDER — HEMOSTATIC AGENTS (NO CHARGE) OPTIME
TOPICAL | Status: DC | PRN
  Administered 2011-03-12: 1 via TOPICAL

## 2011-03-12 MED ORDER — DEXMEDETOMIDINE HCL 100 MCG/ML IV SOLN
0.1000 ug/kg/h | INTRAVENOUS | Status: DC
  Administered 2011-03-12 – 2011-03-13 (×2): 0.7 ug/kg/h via INTRAVENOUS
  Filled 2011-03-12 (×3): qty 2

## 2011-03-12 MED ORDER — ASPIRIN EC 325 MG PO TBEC
325.0000 mg | DELAYED_RELEASE_TABLET | Freq: Every day | ORAL | Status: DC
  Administered 2011-03-13 – 2011-03-14 (×2): 325 mg via ORAL
  Filled 2011-03-12 (×2): qty 1

## 2011-03-12 MED ORDER — SODIUM CHLORIDE 0.9 % IV SOLN: 250.0000 mL | INTRAVENOUS | Status: DC

## 2011-03-12 MED ORDER — LIDOCAINE HCL (CARDIAC) 20 MG/ML IV SOLN
INTRAVENOUS | Status: DC | PRN
  Administered 2011-03-12: 100 mg via INTRAVENOUS

## 2011-03-12 MED ORDER — PANTOPRAZOLE SODIUM 40 MG PO TBEC: 40.0000 mg | DELAYED_RELEASE_TABLET | Freq: Every day | ORAL | Status: DC

## 2011-03-12 MED ORDER — METOPROLOL TARTRATE 12.5 MG HALF TABLET
12.5000 mg | ORAL_TABLET | Freq: Two times a day (BID) | ORAL | Status: DC
  Filled 2011-03-12 (×5): qty 1

## 2011-03-12 MED ORDER — ALBUMIN HUMAN 5 % IV SOLN
INTRAVENOUS | Status: DC | PRN
  Administered 2011-03-12: 15:00:00 via INTRAVENOUS

## 2011-03-12 MED ORDER — SODIUM CHLORIDE 0.9 % IV SOLN
200.0000 ug | INTRAVENOUS | Status: DC | PRN
  Administered 2011-03-12: 0.2 ug/kg/h via INTRAVENOUS

## 2011-03-12 MED ORDER — SODIUM CHLORIDE 0.9 % IJ SOLN: 3.0000 mL | INTRAMUSCULAR | Status: DC | PRN

## 2011-03-12 MED ORDER — ONDANSETRON HCL 4 MG/2ML IJ SOLN
4.0000 mg | Freq: Four times a day (QID) | INTRAMUSCULAR | Status: DC | PRN
Start: 2011-03-12 — End: 2011-03-14
  Administered 2011-03-14: 4 mg via INTRAVENOUS
  Filled 2011-03-12: qty 2

## 2011-03-12 MED ORDER — DOPAMINE-DEXTROSE 3.2-5 MG/ML-% IV SOLN: 0.0000 ug/kg/min | INTRAVENOUS | Status: DC

## 2011-03-12 MED ORDER — DOPAMINE-DEXTROSE 3.2-5 MG/ML-% IV SOLN
INTRAVENOUS | Status: DC | PRN
  Administered 2011-03-12: 3 ug/kg/min via INTRAVENOUS

## 2011-03-12 MED ORDER — PHENYLEPHRINE HCL 10 MG/ML IJ SOLN
20.0000 mg | INTRAVENOUS | Status: DC | PRN
  Administered 2011-03-12: 25 ug/min via INTRAVENOUS
  Administered 2011-03-12: 13.3 ug/min via INTRAVENOUS

## 2011-03-12 SURGICAL SUPPLY — 148 items
ADAPTER CARDIO PERF ANTE/RETRO (ADAPTER) ×3 IMPLANT
ANTEGRADE CPLG (MISCELLANEOUS) ×3 IMPLANT
APPLIER CLIP 9.375 MED OPEN (MISCELLANEOUS)
APPLIER CLIP 9.375 SM OPEN (CLIP)
ATTRACTOMAT 16X20 MAGNETIC DRP (DRAPES) ×3 IMPLANT
BAG DECANTER FOR FLEXI CONT (MISCELLANEOUS) ×3 IMPLANT
BANDAGE ELASTIC 4 VELCRO ST LF (GAUZE/BANDAGES/DRESSINGS) ×3 IMPLANT
BANDAGE ELASTIC 6 VELCRO ST LF (GAUZE/BANDAGES/DRESSINGS) ×3 IMPLANT
BANDAGE GAUZE ELAST BULKY 4 IN (GAUZE/BANDAGES/DRESSINGS) ×3 IMPLANT
BASKET HEART  (ORDER IN 25'S) (MISCELLANEOUS) ×1
BASKET HEART (ORDER IN 25'S) (MISCELLANEOUS) ×1
BASKET HEART (ORDER IN 25S) (MISCELLANEOUS) ×1 IMPLANT
BLADE SAW STERNAL (BLADE) ×3 IMPLANT
BLADE SURG 12 STRL SS (BLADE) ×3 IMPLANT
BLADE SURG 15 STRL LF DISP TIS (BLADE) ×1 IMPLANT
BLADE SURG 15 STRL SS (BLADE) ×2
BLADE SURG ROTATE 9660 (MISCELLANEOUS) IMPLANT
CANISTER SUCTION 2500CC (MISCELLANEOUS) ×3 IMPLANT
CANNULA GUNDRY RCSP 15FR (MISCELLANEOUS) ×3 IMPLANT
CATH CPB KIT VANTRIGT (MISCELLANEOUS) ×3 IMPLANT
CATH RETROPLEGIA CORONARY 14FR (CATHETERS) ×3 IMPLANT
CATH ROBINSON RED A/P 18FR (CATHETERS) ×9 IMPLANT
CATH THORACIC 28FR (CATHETERS) IMPLANT
CATH THORACIC 28FR RT ANG (CATHETERS) IMPLANT
CATH THORACIC 36FR (CATHETERS) IMPLANT
CATH THORACIC 36FR RT ANG (CATHETERS) ×6 IMPLANT
CLIP APPLIE 9.375 MED OPEN (MISCELLANEOUS) IMPLANT
CLIP APPLIE 9.375 SM OPEN (CLIP) IMPLANT
CLIP FOGARTY SPRING 6M (CLIP) IMPLANT
CLIP TI MEDIUM 24 (CLIP) IMPLANT
CLIP TI WIDE RED SMALL 24 (CLIP) ×3 IMPLANT
CLOTH BEACON ORANGE TIMEOUT ST (SAFETY) ×3 IMPLANT
CONN Y 3/8X3/8X3/8  BEN (MISCELLANEOUS)
CONN Y 3/8X3/8X3/8 BEN (MISCELLANEOUS) IMPLANT
COVER SURGICAL LIGHT HANDLE (MISCELLANEOUS) ×6 IMPLANT
CRADLE DONUT ADULT HEAD (MISCELLANEOUS) ×3 IMPLANT
DERMABOND ADVANCED (GAUZE/BANDAGES/DRESSINGS) ×2
DERMABOND ADVANCED .7 DNX12 (GAUZE/BANDAGES/DRESSINGS) ×1 IMPLANT
DRAIN CHANNEL 32F RND 10.7 FF (WOUND CARE) ×3 IMPLANT
DRAPE CARDIOVASCULAR INCISE (DRAPES) ×2
DRAPE SLUSH MACHINE 52X66 (DRAPES) IMPLANT
DRAPE SLUSH/WARMER DISC (DRAPES) IMPLANT
DRAPE SRG 135X102X78XABS (DRAPES) ×1 IMPLANT
DRSG COVADERM 4X14 (GAUZE/BANDAGES/DRESSINGS) ×3 IMPLANT
ELECT BLADE 4.0 EZ CLEAN MEGAD (MISCELLANEOUS) ×3
ELECT BLADE 6.5 EXT (BLADE) ×3 IMPLANT
ELECT CAUTERY BLADE 6.4 (BLADE) ×3 IMPLANT
ELECT REM PT RETURN 9FT ADLT (ELECTROSURGICAL) ×6
ELECTRODE BLDE 4.0 EZ CLN MEGD (MISCELLANEOUS) ×1 IMPLANT
ELECTRODE REM PT RTRN 9FT ADLT (ELECTROSURGICAL) ×2 IMPLANT
GAUZE XEROFORM 1X8 LF (GAUZE/BANDAGES/DRESSINGS) ×6 IMPLANT
GLOVE BIO SURGEON STRL SZ 6 (GLOVE) ×9 IMPLANT
GLOVE BIO SURGEON STRL SZ 6.5 (GLOVE) ×10 IMPLANT
GLOVE BIO SURGEON STRL SZ7 (GLOVE) IMPLANT
GLOVE BIO SURGEON STRL SZ7.5 (GLOVE) ×6 IMPLANT
GLOVE BIO SURGEONS STRL SZ 6.5 (GLOVE) ×5
GLOVE BIOGEL PI IND STRL 6 (GLOVE) ×5 IMPLANT
GLOVE BIOGEL PI IND STRL 6.5 (GLOVE) ×4 IMPLANT
GLOVE BIOGEL PI IND STRL 7.0 (GLOVE) ×3 IMPLANT
GLOVE BIOGEL PI INDICATOR 6 (GLOVE) ×10
GLOVE BIOGEL PI INDICATOR 6.5 (GLOVE) ×8
GLOVE BIOGEL PI INDICATOR 7.0 (GLOVE) ×6
GLOVE EUDERMIC 7 POWDERFREE (GLOVE) IMPLANT
GLOVE ORTHO TXT STRL SZ7.5 (GLOVE) IMPLANT
GOWN STRL NON-REIN LRG LVL3 (GOWN DISPOSABLE) ×30 IMPLANT
HEMOSTAT POWDER SURGIFOAM 1G (HEMOSTASIS) ×9 IMPLANT
HEMOSTAT SURGICEL 2X14 (HEMOSTASIS) ×9 IMPLANT
INSERT FOGARTY 61MM (MISCELLANEOUS) IMPLANT
INSERT FOGARTY XLG (MISCELLANEOUS) IMPLANT
IV ADAPTER SYR DOUBLE MALE LL (MISCELLANEOUS) ×3 IMPLANT
KIT BASIN OR (CUSTOM PROCEDURE TRAY) ×3 IMPLANT
KIT ROOM TURNOVER OR (KITS) ×3 IMPLANT
KIT SUCTION CATH 14FR (SUCTIONS) ×3 IMPLANT
KIT VASOVIEW W/TROCAR VH 2000 (KITS) ×3 IMPLANT
LEAD PACING MYOCARDI (MISCELLANEOUS) ×3 IMPLANT
LINE VENT (MISCELLANEOUS) ×6 IMPLANT
MARKER GRAFT CORONARY BYPASS (MISCELLANEOUS) ×9 IMPLANT
NS IRRIG 1000ML POUR BTL (IV SOLUTION) ×15 IMPLANT
PACK OPEN HEART (CUSTOM PROCEDURE TRAY) ×3 IMPLANT
PAD ARMBOARD 7.5X6 YLW CONV (MISCELLANEOUS) ×6 IMPLANT
PENCIL BUTTON HOLSTER BLD 10FT (ELECTRODE) ×3 IMPLANT
PUNCH AORTIC ROTATE 4.0MM (MISCELLANEOUS) ×3 IMPLANT
PUNCH AORTIC ROTATE 4.5MM 8IN (MISCELLANEOUS) IMPLANT
PUNCH AORTIC ROTATE 5MM 8IN (MISCELLANEOUS) IMPLANT
SET CARDIOPLEGIA MPS 5001102 (MISCELLANEOUS) ×3 IMPLANT
SET Y EXTENSION LINE CSP (IV SETS) ×3 IMPLANT
SOLUTION ANTI FOG 6CC (MISCELLANEOUS) IMPLANT
SPONGE GAUZE 4X4 12PLY (GAUZE/BANDAGES/DRESSINGS) ×6 IMPLANT
SPONGE INTESTINAL PEANUT (DISPOSABLE) IMPLANT
SPONGE LAP 18X18 X RAY DECT (DISPOSABLE) ×12 IMPLANT
SPONGE LAP 4X18 X RAY DECT (DISPOSABLE) ×3 IMPLANT
SURGIFLO TRUKIT (HEMOSTASIS) ×3 IMPLANT
SUT BONE WAX W31G (SUTURE) ×3 IMPLANT
SUT ETHIBON 2 0 V 52N 30 (SUTURE) ×6 IMPLANT
SUT ETHIBON EXCEL 2-0 V-5 (SUTURE) IMPLANT
SUT ETHIBOND 2 0 SH (SUTURE)
SUT ETHIBOND 2 0 SH 36X2 (SUTURE) IMPLANT
SUT ETHIBOND 2 0 V4 (SUTURE) IMPLANT
SUT ETHIBOND 2 0V4 GREEN (SUTURE) IMPLANT
SUT ETHIBOND 4 0 RB 1 (SUTURE) ×3 IMPLANT
SUT ETHIBOND V-5 VALVE (SUTURE) IMPLANT
SUT MNCRL AB 4-0 PS2 18 (SUTURE) ×3 IMPLANT
SUT PROLENE 2 0 MH 48 (SUTURE) ×3 IMPLANT
SUT PROLENE 3 0 SH 1 (SUTURE) IMPLANT
SUT PROLENE 3 0 SH DA (SUTURE) IMPLANT
SUT PROLENE 3 0 SH1 36 (SUTURE) IMPLANT
SUT PROLENE 4 0 RB 1 (SUTURE) ×8
SUT PROLENE 4 0 SH DA (SUTURE) ×36 IMPLANT
SUT PROLENE 4-0 RB1 .5 CRCL 36 (SUTURE) ×4 IMPLANT
SUT PROLENE 5 0 C 1 36 (SUTURE) ×3 IMPLANT
SUT PROLENE 6 0 C 1 30 (SUTURE) ×6 IMPLANT
SUT PROLENE 6 0 CC (SUTURE) IMPLANT
SUT PROLENE 7 0 BV 1 (SUTURE) IMPLANT
SUT PROLENE 7 0 BV1 MDA (SUTURE) IMPLANT
SUT PROLENE 7 0 DA (SUTURE) IMPLANT
SUT PROLENE 7.0 RB 3 (SUTURE) ×9 IMPLANT
SUT PROLENE 8 0 BV175 6 (SUTURE) ×12 IMPLANT
SUT PROLENE BLUE 7 0 (SUTURE) ×3 IMPLANT
SUT PROLENE POLY MONO (SUTURE) IMPLANT
SUT SILK  1 MH (SUTURE)
SUT SILK 1 MH (SUTURE) IMPLANT
SUT SILK 2 0 SH CR/8 (SUTURE) ×6 IMPLANT
SUT SILK 3 0 SH CR/8 (SUTURE) IMPLANT
SUT STEEL 6MS V (SUTURE) ×6 IMPLANT
SUT STEEL STERNAL CCS#1 18IN (SUTURE) IMPLANT
SUT STEEL SZ 6 DBL 3X14 BALL (SUTURE) ×3 IMPLANT
SUT VIC AB 1 CTX 18 (SUTURE) ×6 IMPLANT
SUT VIC AB 1 CTX 36 (SUTURE) ×4
SUT VIC AB 1 CTX36XBRD ANBCTR (SUTURE) ×2 IMPLANT
SUT VIC AB 2-0 CT1 27 (SUTURE) ×2
SUT VIC AB 2-0 CT1 TAPERPNT 27 (SUTURE) ×1 IMPLANT
SUT VIC AB 2-0 CTX 27 (SUTURE) IMPLANT
SUT VIC AB 3-0 SH 27 (SUTURE)
SUT VIC AB 3-0 SH 27X BRD (SUTURE) IMPLANT
SUT VIC AB 3-0 X1 27 (SUTURE) IMPLANT
SUT VICRYL 4-0 PS2 18IN ABS (SUTURE) IMPLANT
SUTURE E-PAK OPEN HEART (SUTURE) ×3 IMPLANT
SYR 10ML KIT SKIN ADHESIVE (MISCELLANEOUS) IMPLANT
SYSTEM SAHARA CHEST DRAIN ATS (WOUND CARE) ×3 IMPLANT
TAPE CLOTH SURG 4X10 WHT LF (GAUZE/BANDAGES/DRESSINGS) ×3 IMPLANT
TOWEL OR 17X24 6PK STRL BLUE (TOWEL DISPOSABLE) ×6 IMPLANT
TOWEL OR 17X26 10 PK STRL BLUE (TOWEL DISPOSABLE) ×6 IMPLANT
TRAY CATH LUMEN 1 20CM STRL (SET/KITS/TRAYS/PACK) ×3 IMPLANT
TRAY FOLEY IC TEMP SENS 16FR (CATHETERS) ×3 IMPLANT
TUBING INSUFFLATION 10FT LAP (TUBING) ×3 IMPLANT
UNDERPAD 30X30 INCONTINENT (UNDERPADS AND DIAPERS) ×3 IMPLANT
VALVE AORTIC MAGNA 21MM (Prosthesis & Implant Heart) ×3 IMPLANT
WATER STERILE IRR 1000ML POUR (IV SOLUTION) ×6 IMPLANT

## 2011-03-12 NOTE — Anesthesia Procedure Notes (Signed)
Procedure Name: Intubation Date/Time: 03/12/2011 8:00 AM Performed by: Tyrone Nine Pre-anesthesia Checklist: Patient identified, Timeout performed, Emergency Drugs available, Suction available and Patient being monitored Patient Re-evaluated:Patient Re-evaluated prior to inductionOxygen Delivery Method: Circle system utilized Preoxygenation: Pre-oxygenation with 100% oxygen Intubation Type: IV induction Ventilation: Mask ventilation without difficulty Laryngoscope Size: Mac and 3 Grade View: Grade I Tube type: Parker flex tip Tube size: 8.0 mm Placement Confirmation: ETT inserted through vocal cords under direct vision,  breath sounds checked- equal and bilateral and positive ETCO2 Secured at: 23 cm Tube secured with: Tape Dental Injury: Teeth and Oropharynx as per pre-operative assessment

## 2011-03-12 NOTE — Preoperative (Addendum)
Beta Blockers   Metoprolol 50 mgs taken @ 2249 on 03/10/10

## 2011-03-12 NOTE — Anesthesia Preprocedure Evaluation (Addendum)
Anesthesia Evaluation  Patient identified by MRN, date of birth, ID band Patient awake    Reviewed: Allergy & Precautions, H&P , NPO status , Patient's Chart, lab work & pertinent test results  Airway Mallampati: II TM Distance: >3 FB   Mouth opening: Limited Mouth Opening  Dental  (+) Dental Advisory Given, Upper Dentures and Partial Lower   Pulmonary neg pulmonary ROS,  clear to auscultation  Pulmonary exam normal       Cardiovascular hypertension, Pt. on medications and Pt. on home beta blockers + CAD + Valvular Problems/Murmurs AS and AI Normal    Neuro/Psych Negative Neurological ROS     GI/Hepatic Neg liver ROS, PUD, GERD-  Medicated and Controlled,  Endo/Other  Negative Endocrine ROS  Renal/GU Renal InsufficiencyRenal disease  Genitourinary negative   Musculoskeletal  (+) Arthritis -, Osteoarthritis,    Abdominal Normal abdominal exam  (+)   Peds  Hematology negative hematology ROS (+)   Anesthesia Other Findings UPPER DENTURES REMOVED.  Will deliver to pt's wife.  Reproductive/Obstetrics                        Anesthesia Physical Anesthesia Plan  ASA: IV  Anesthesia Plan: General   Post-op Pain Management:    Induction: Intravenous  Airway Management Planned: Oral ETT  Additional Equipment: Arterial line, CVP, PA Cath, 3D TEE and Ultrasound Guidance Line Placement  Intra-op Plan:   Post-operative Plan: Extubation in OR  Informed Consent: I have reviewed the patients History and Physical, chart, labs and discussed the procedure including the risks, benefits and alternatives for the proposed anesthesia with the patient or authorized representative who has indicated his/her understanding and acceptance.   Dental advisory given  Plan Discussed with: CRNA, Anesthesiologist and Surgeon  Anesthesia Plan Comments:        Anesthesia Quick Evaluation

## 2011-03-12 NOTE — Progress Notes (Signed)
The patient was examined and preop studies reviewed. There has been no change from the prior exam and the patient is ready for surgery.  Plan AVR-CABG

## 2011-03-12 NOTE — Transfer of Care (Signed)
Immediate Anesthesia Transfer of Care Note  Patient: Edward Pena  Procedure(s) Performed: Procedure(s) (LRB): AORTIC VALVE REPLACEMENT (AVR) (N/A) CORONARY ARTERY BYPASS GRAFTING (CABG) (N/A)  Patient Location: SICU  Anesthesia Type: General  Level of Consciousness: Patient remains intubated per anesthesia plan  Airway & Oxygen Therapy: Patient remains intubated per anesthesia plan and Patient placed on Ventilator (see vital sign flow sheet for setting)  Post-op Assessment: Report given to PACU RN and Post -op Vital signs reviewed and stable  Post vital signs: Reviewed and stable  Complications: No apparent anesthesia complications

## 2011-03-12 NOTE — Progress Notes (Signed)
Discussed CO and CI with MD at bedside.

## 2011-03-12 NOTE — Brief Op Note (Signed)
03/10/2011 - 03/12/2011  12:49 PM  PATIENT:  Edward Pena  78 y.o. male  PRE-OPERATIVE DIAGNOSIS:  1.Sever aortic stenosis 2 History of  coronary artery disease  POST-OPERATIVE DIAGNOSIS: 1.Severe  aortic stenosis 2 History of coronary artery disease  PROCEDURE:  CORONARY ARTERY BYPASS GRAFTING (CABG) x 2 (LIMA to LAD, SVG to OM1) with EVH from the right thigh;AORTIC VALVE REPLACEMENT (AVR) using a Magna Ease pericardial tissue valve (size 21 mm)   SURGEON:  Surgeon(s) and Role:    * Mikey Bussing, MD - Primary  PHYSICIAN ASSISTANT: Doree Fudge PA-C   ANESTHESIA:   general  EBL:  Total I/O In: 4720 [I.V.:3850; Blood:870] Out: 2375 [Urine:375; Blood:2000]  BLOOD ADMINISTERED:1 CC PRBC,2 FFP, and 1 Platelet  DRAINS:  Chest Tube(s) in the mediastinal and pleural spaces    COUNTS CORRECT:  YES  DICTATION: .Dragon Dictation  PLAN OF CARE: Admit to inpatient   PATIENT DISPOSITION:  ICU - intubated and hemodynamically stable.   Delay start of Pharmacological VTE agent (>24hrs) due to surgical blood loss or risk of bleeding: yes  PRE OP WEIGHT: 84 kg

## 2011-03-12 NOTE — OR Nursing (Signed)
Leg vein harvest procedure start at 08:38. Chest incision start at 09:04.

## 2011-03-12 NOTE — Progress Notes (Signed)
Arrived from OR stable, unable to insert OG tube as per normal protocol. CRNA advised that she was unable to insert with scope. Attempted x4 with different RNs at bedside. MD will be advised on arrival.

## 2011-03-12 NOTE — Progress Notes (Signed)
TCTS BRIEF SICU PROGRESS NOTE  Day of Surgery  S/P Procedure(s) (LRB): AORTIC VALVE REPLACEMENT (AVR) (N/A) CORONARY ARTERY BYPASS GRAFTING (CABG) (N/A)   Sedated on vent Sinus rhythm, hemodynamics stable on low dose dopamine and neo Chest tube output low UOP excellent Labs okay except Hgb 8  Plan: Continue routine postop.  Receiving PRBC's for acute blood loss anemia  Clydette Privitera H 03/12/2011 5:52 PM

## 2011-03-13 ENCOUNTER — Encounter (HOSPITAL_COMMUNITY): Payer: Self-pay | Admitting: Certified Registered Nurse Anesthetist

## 2011-03-13 ENCOUNTER — Inpatient Hospital Stay (HOSPITAL_COMMUNITY): Payer: Medicare Other

## 2011-03-13 DIAGNOSIS — R001 Bradycardia, unspecified: Secondary | ICD-10-CM | POA: Diagnosis present

## 2011-03-13 DIAGNOSIS — I498 Other specified cardiac arrhythmias: Secondary | ICD-10-CM

## 2011-03-13 LAB — POCT I-STAT 3, ART BLOOD GAS (G3+)
Acid-base deficit: 2 mmol/L (ref 0.0–2.0)
Acid-base deficit: 3 mmol/L — ABNORMAL HIGH (ref 0.0–2.0)
Bicarbonate: 23.6 mEq/L (ref 20.0–24.0)
O2 Saturation: 97 %
O2 Saturation: 96 %
Patient temperature: 36.3
Patient temperature: 37.3
TCO2: 23 mmol/L (ref 0–100)

## 2011-03-13 LAB — PREPARE PLATELET PHERESIS: Unit division: 0

## 2011-03-13 LAB — PREPARE FRESH FROZEN PLASMA
Unit division: 0
Unit division: 0

## 2011-03-13 LAB — CBC
HCT: 26.9 % — ABNORMAL LOW (ref 39.0–52.0)
Hemoglobin: 8.4 g/dL — ABNORMAL LOW (ref 13.0–17.0)
Hemoglobin: 9.4 g/dL — ABNORMAL LOW (ref 13.0–17.0)
MCH: 33.2 pg (ref 26.0–34.0)
MCV: 91.7 fL (ref 78.0–100.0)
RBC: 2.53 MIL/uL — ABNORMAL LOW (ref 4.22–5.81)
WBC: 13.7 10*3/uL — ABNORMAL HIGH (ref 4.0–10.5)

## 2011-03-13 LAB — GLUCOSE, CAPILLARY
Glucose-Capillary: 132 mg/dL — ABNORMAL HIGH (ref 70–99)
Glucose-Capillary: 138 mg/dL — ABNORMAL HIGH (ref 70–99)
Glucose-Capillary: 139 mg/dL — ABNORMAL HIGH (ref 70–99)
Glucose-Capillary: 144 mg/dL — ABNORMAL HIGH (ref 70–99)
Glucose-Capillary: 149 mg/dL — ABNORMAL HIGH (ref 70–99)
Glucose-Capillary: 85 mg/dL (ref 70–99)

## 2011-03-13 LAB — CREATININE, SERUM
GFR calc Af Amer: 87 mL/min — ABNORMAL LOW (ref >90)
GFR calc non Af Amer: 75 mL/min — ABNORMAL LOW (ref >90)

## 2011-03-13 LAB — BASIC METABOLIC PANEL
BUN: 12 mg/dL (ref 6–23)
CO2: 25 mEq/L (ref 19–32)
Calcium: 7.3 mg/dL — ABNORMAL LOW (ref 8.4–10.5)
Creatinine, Ser: 0.76 mg/dL (ref 0.50–1.35)
Glucose, Bld: 132 mg/dL — ABNORMAL HIGH (ref 70–99)

## 2011-03-13 LAB — POCT I-STAT, CHEM 8
BUN: 17 mg/dL (ref 6–23)
Calcium, Ion: 1.1 mmol/L — ABNORMAL LOW (ref 1.12–1.32)
Creatinine, Ser: 0.8 mg/dL (ref 0.50–1.35)
TCO2: 24 mmol/L (ref 0–100)

## 2011-03-13 LAB — MAGNESIUM: Magnesium: 2.2 mg/dL (ref 1.5–2.5)

## 2011-03-13 MED ORDER — FUROSEMIDE 10 MG/ML IJ SOLN
40.0000 mg | Freq: Once | INTRAMUSCULAR | Status: AC
  Administered 2011-03-13: 40 mg via INTRAVENOUS
  Filled 2011-03-13: qty 4

## 2011-03-13 MED ORDER — INSULIN ASPART 100 UNIT/ML ~~LOC~~ SOLN: 0.0000 [IU] | SUBCUTANEOUS | Status: DC

## 2011-03-13 MED ORDER — AMLODIPINE BESYLATE 2.5 MG PO TABS
2.5000 mg | ORAL_TABLET | Freq: Every day | ORAL | Status: DC
  Filled 2011-03-13 (×2): qty 1

## 2011-03-13 MED ORDER — FUROSEMIDE 10 MG/ML IJ SOLN
40.0000 mg | Freq: Two times a day (BID) | INTRAMUSCULAR | Status: AC
  Administered 2011-03-13 – 2011-03-14 (×2): 40 mg via INTRAVENOUS
  Filled 2011-03-13 (×2): qty 4

## 2011-03-13 MED ORDER — TRAMADOL HCL 50 MG PO TABS
50.0000 mg | ORAL_TABLET | Freq: Four times a day (QID) | ORAL | Status: DC | PRN
  Administered 2011-03-13 – 2011-03-25 (×21): 50 mg via ORAL
  Filled 2011-03-13 (×22): qty 1

## 2011-03-13 MED ORDER — INSULIN ASPART 100 UNIT/ML ~~LOC~~ SOLN
0.0000 [IU] | SUBCUTANEOUS | Status: DC
  Administered 2011-03-13 – 2011-03-14 (×6): 2 [IU] via SUBCUTANEOUS
  Administered 2011-03-14: 4 [IU] via SUBCUTANEOUS
  Filled 2011-03-13: qty 3

## 2011-03-13 MED ORDER — INSULIN ASPART 100 UNIT/ML ~~LOC~~ SOLN
0.0000 [IU] | SUBCUTANEOUS | Status: DC
  Filled 2011-03-13 (×2): qty 3

## 2011-03-13 MED FILL — Heparin Sodium (Porcine) Inj 1000 Unit/ML: INTRAMUSCULAR | Qty: 10 | Status: AC

## 2011-03-13 MED FILL — Verapamil HCl IV Soln 2.5 MG/ML: INTRAVENOUS | Qty: 4 | Status: AC

## 2011-03-13 MED FILL — Dexmedetomidine HCl IV Soln 200 MCG/2ML: INTRAVENOUS | Qty: 2 | Status: AC

## 2011-03-13 MED FILL — Nitroglycerin IV Soln 5 MG/ML: INTRAVENOUS | Qty: 10 | Status: AC

## 2011-03-13 MED FILL — Magnesium Sulfate Inj 50%: INTRAMUSCULAR | Qty: 10 | Status: AC

## 2011-03-13 MED FILL — Lactated Ringer's Solution: INTRAVENOUS | Qty: 500 | Status: AC

## 2011-03-13 MED FILL — Potassium Chloride Inj 2 mEq/ML: INTRAVENOUS | Qty: 40 | Status: AC

## 2011-03-13 NOTE — Progress Notes (Signed)
1 Day Post-Op Procedure(s) (LRB): AORTIC VALVE REPLACEMENT (AVR) (N/A) CORONARY ARTERY BYPASS GRAFTING (CABG) (N/A) Subjective:awake after AVR CABG,slow NSR  Objective: Vital signs in last 24 hours: Temp:  [93.9 F (34.4 C)-98.1 F (36.7 C)] 98.1 F (36.7 C) (03/01 0700) Pulse Rate:  [80-88] 88  (03/01 0700) Cardiac Rhythm:  [-] A-V Sequential paced (03/01 0001) Resp:  [12-15] 15  (03/01 0700) BP: (88-144)/(43-72) 100/50 mmHg (03/01 0427) SpO2:  [92 %-100 %] 100 % (03/01 0700) Arterial Line BP: (90-155)/(45-70) 109/53 mmHg (03/01 0700) FiO2 (%):  [49.3 %-51.3 %] 50.2 % (03/01 0700) Weight:  [208 lb 1.8 oz (94.4 kg)] 208 lb 1.8 oz (94.4 kg) (03/01 0500)  Hemodynamic parameters for last 24 hours: PAP: (22-33)/(13-21) 29/17 mmHg CO:  [2.7 L/min-5.3 L/min] 4.2 L/min CI:  [1.3 L/min/m2-2.7 L/min/m2] 2.4 L/min/m2  Intake/Output from previous day: 02/28 0701 - 03/01 0700 In: 12553.6 [I.V.:7911.1; Blood:3472.5; IV Piggyback:1170] Out: 7650 [Urine:2670; PJASN:0539; Chest Tube:535] Intake/Output this shift:    A-Paced ,extrem warm  Lab Results:  [facility] 03/13/11 0445 03/12/11 2143 03/12/11 2130  WBC 9.7 -- 8.0  HGB 8.4* 9.2* --  HCT 23.2* 27.0* --  PLT 96* -- 101*   BMET:  Basename 03/13/11 0445 03/12/11 2143 03/11/11 0605  NA 131* 136 --  K 4.3 4.3 --  CL 103 101 --  CO2 25 -- 27  GLUCOSE 132* 118* --  BUN 12 10 --  CREATININE 0.76 0.80 --  CALCIUM 7.3* -- 9.0    PT/INR:  Basename 03/12/11 1620  LABPROT 22.3*  INR 1.92*   ABG    Component Value Date/Time   PHART 7.375 03/13/2011 0448   HCO3 23.6 03/13/2011 0448   TCO2 25 03/13/2011 0448   ACIDBASEDEF 2.0 03/13/2011 0448   O2SAT 97.0 03/13/2011 0448   CBG (last 3)   Basename 03/13/11 0615 03/13/11 0344 03/13/11 0256  GLUCAP 149* 85 84    Assessment/Plan: S/P Procedure(s) (LRB): AORTIC VALVE REPLACEMENT (AVR) (N/A) CORONARY ARTERY BYPASS GRAFTING (CABG) (N/A) 1 unit PRBC,extubate,DC lines   LOS: 3 days     VAN TRIGT III,Edward Pena 03/13/2011

## 2011-03-13 NOTE — Procedures (Signed)
Extubation Procedure Note  Patient Details:   Name: Edward Pena DOB: [birth year] MRN: [id number]   Airway Documentation:     Evaluation  O2 sats: stable throughout and currently acceptable Complications: No apparent complications Patient did tolerate procedure well. Bilateral Breath Sounds: Clear Suctioning: Airway Yes Pt awake and ert, extubated per protocol, placed on 3L [place], sat 99%. BBS RH, positive cuff leak,IF-28, VC 750, IS 600. Pt able to vocalize.  Arloa Koh 03/13/2011, 10:01 AM

## 2011-03-13 NOTE — Op Note (Signed)
NAMEDAMASCUS, FELDPAUSCH NO.:  [id number]  MEDICAL RECORD NO.:  [id number]  LOCATION:  2306                         FACILITY:  [facility]  PHYSICIAN:  Kerin Perna, M.D.  DATE OF BIRTH:  06/17/1926  DATE OF PROCEDURE: DATE OF DISCHARGE:                              OPERATIVE REPORT   OPERATIONS: 1. Aortic valve replacement with a 21-mm pericardial Magna Ease valve     (serial [id number]). 2. Coronary artery bypass grafting x2 (left internal mammary artery to     distal LAD, saphenous vein graft to first obtuse marginal). 3. Endoscopic harvest of right leg greater saphenous vein. 4. Placement of left femoral artery line for blood pressure     monitoring.  PREOPERATIVE DIAGNOSES: 1. Severe aortic stenosis. 2. Moderate 2-vessel coronary artery disease.  POSTOPERATIVE DIAGNOSES: 1. Severe aortic stenosis. 2. Moderate 2-vessel coronary artery disease.  SURGEON:  Kerin Perna, MD  ASSISTANT:  Doree Fudge, PA-C.  ANESTHESIA:  General by Dr. Johnathan Hausen.  INDICATIONS:  The patient is an 78 year old gentleman with progressive aortic stenosis on serial 2D echos to a critical AS on the last echo. He has had decreased exercise tolerance and increasing shortness of breath with exertion.  Cardiac catheterizations demonstrated mild pulmonary hypertension, 70% stenosis to the mid to distal LAD, before a stent, and stenosis at the origin of the OM1.  Systolic function is normal by echo, but he has severe LVH.  He is felt to be a candidate for tissue AVR and a combined coronary artery bypass grafting.  I examined the patient in his hospital room after reviewing the results of the echo and cardiac cath.  I discussed the operation of AVR and CABG with the patient and his wife.  He understood the indications, benefits, alternatives, and expected hospital recovery.  He understood the risks of stroke, bleeding, blood transfusion requirement, MI, infection, and death.   After reviewing these issues, he demonstrated his understanding and agreed to proceed with surgery under what I felt was an informed consent.  OPERATIVE FINDINGS: 1. Severe calcified aortic stenosis corrected with a 21-mm AVR with     good function on post-cardiopulmonary bypass TEE. 2. Adequate conduit.  Adequate targets, although the LAD was very     distal. 3. Intraoperative coagulopathy with a platelet count of 50,000     requiring blood and coagulation factors.  PROCEDURE:  The patient was brought to the operating room and placed supine on the operating room table.  General anesthesia was induced.  A transesophageal echo probe was placed by the anesthesiologist, which confirmed the preoperative diagnosis of severe aortic stenosis and moderate AI.  The patient was prepped and draped as a sterile field, and a proper time-out was performed.  A sternal incision was made as the saphenous vein was harvested endoscopically from the right leg.  The left internal mammary artery was harvested as a pedicle graft.  It was a good vessel with excellent flow.  The sternal retractor was placed and the pericardium was opened.  The aorta was inspected and found to be with mild dilatation and calcification.  Pursestrings were placed in the ascending aorta and right atrium,  and after the heparin had been administered, an ACT was documented as being therapeutic.  The patient was cannulated and placed on bypass.  The coronaries were identified for grafting.  The OM was intramyocardial.  The cardioplegia cannulas were placed for both antegrade and retrograde cold blood cardioplegia and the patient was cooled to 32 degrees.  The aortic crossclamp was applied and 800 mL of cold blood cardioplegia was delivered in split doses between the antegrade aortic and retrograde coronary sinus catheters.  There was good cardioplegic arrest.  Cardioplegia was delivered every 20 minutes or less.  The 2 distal  coronary anastomoses were then performed.  The first distal was to the OM1.  This was intramyocardial and was a 1.5-mm vessel.  A reverse saphenous vein was sewn end-to-side with running 7-0 Prolene with good flow through the graft.  The second distal anastomosis was to the distal LAD beyond the previously placed stent.  The LAD was 1.5 mm and the left IMA pedicle was brought through an opening and the pericardium was brought down onto the LAD and sewn end-to-side with running 8-0 Prolene.  There was good flow through the graft.  The bulldog was reapplied and the pedicle secured at the epicardium. Cardioplegia was redosed.  A transverse aortotomy was performed.  The aortic valve was inspected. It had 3 leaflets which were heavily calcified and stenotic.  The leaflets were excised.  The anulus was debrided of calcium.  The anulus was sized to a 21-mm valve.  The outflow tract was irrigated with copious amounts of cold saline.  Subannular 2-0 Ethibond sutures were placed around the anulus numbering 16 total.  The valve was prepared according to protocol, and the sutures were placed through the sewing ring and the valve was seated and sutures were tied.  The valve conformed to the anulus well without space for leak and the coronary ostia were widely patent.  The aortotomy was closed with a running 4-0 Prolene in 2 layers.  Air was vented from the coronaries with a warm retrograde hotshot and the cross-clamp was removed.  The heart resumed a spontaneous rhythm.  The patient was rewarmed and reperfused.  The grafts had good flow.  At one point, the pressure increased and there was some bleeding from the toe of the mammary anastomosis. Some sutures had pulled through, and for that reason, the anastomosis was reconstructed after a brief second period of cardioplegic arrest.  After the mammary anastomosis was revised, the patient was rewarmed to 37 degrees and temporary pacing wires  were applied.  The vent was removed from the right superior pulmonary vein and the lungs re-expanded.  The patient was then weaned from bypass without difficulty.  Blood pressure tended to be over a wide range between 70 and 180.  However, protamine was administered without adverse reaction and the cannulas were removed.  The platelet count returned 50,000, so the patient was given platelets and FFP.  The superior pericardial fat was closed over the aorta.  Anterior mediastinal and left pleural chest tube were placed and brought out through separate incisions.  The sternum was closed with interrupted steel wire.  The patient remained hemodynamically stable, but A-line became dampened and a left femoral A-line was placed for blood pressure monitoring.  The transesophageal echo showed excellent global LV function and a normal functioning aortic valve without aortic insufficiency.  The subcutaneous and skin were closed with a running Vicryl and sterile dressings were applied.  Total bypass time was 159  minutes.     Kerin Perna, M.D.     PV/MEDQ  D:  03/12/2011  T:  03/13/2011  Job:  [id number]  cc:   Jonelle Sidle, MD

## 2011-03-13 NOTE — Anesthesia Postprocedure Evaluation (Signed)
  Anesthesia Post-op Note  Patient: Edward Pena  Procedure(s) Performed: Procedure(s) (LRB): AORTIC VALVE REPLACEMENT (AVR) (N/A) CORONARY ARTERY BYPASS GRAFTING (CABG) (N/A)  Patient Location: SICU  Anesthesia Type: General  Level of Consciousness: Patient remains intubated per anesthesia plan  Airway and Oxygen Therapy: Patient remains intubated per anesthesia plan  Post-op Pain: mild  Post-op Assessment: Post-op Vital signs reviewed, Patient's Cardiovascular Status Stable, Respiratory Function Stable, Patent Airway, No signs of Nausea or vomiting and Pain level controlled  Post-op Vital Signs: Reviewed and stable  Complications: No apparent anesthesia complications

## 2011-03-13 NOTE — Progress Notes (Signed)
Subjective:  Stable post op.  Moderate brady with junctional rhythm on ECG.  Alert and spoke with me.    Objective:  Vital Signs in the last 24 hours: Temp:  [93.9 F (34.4 C)-99.7 F (37.6 C)] 99.7 F (37.6 C) (03/01 1110) Pulse Rate:  [80-88] 87  (03/01 1110) Resp:  [8-20] 14  (03/01 1110) BP: (88-144)/(43-72) 136/52 mmHg (03/01 1110) SpO2:  [92 %-100 %] 95 % (03/01 1100) Arterial Line BP: (88-155)/(41-70) 131/41 mmHg (03/01 1015) FiO2 (%):  [40 %-51.3 %] 40 % (03/01 0915) Weight:  [208 lb 1.8 oz (94.4 kg)] 208 lb 1.8 oz (94.4 kg) (03/01 0500)  Intake/Output from previous day: 02/28 0701 - 03/01 0700 In: 12553.6 [I.V.:7911.1; Blood:3472.5; IV Piggyback:1170] Out: 7650 [Urine:2670; ZOXWR:6045; Chest Tube:535]   Physical Exam: General: Well developed, well nourished, in no acute distress. Head:  Normocephalic and atraumatic. Lungs: post op ronchii Heart: Normal S1 and S2.  Prominent rub on exam Pulses: Pulses normal in all 4 extremities. Extremities: No clubbing or cyanosis. No edema. Neurologic: Alert and oriented x 3.    Lab Results:  Basename 03/13/11 0445 03/12/11 2143 03/12/11 2130  WBC 9.7 -- 8.0  HGB 8.4* 9.2* --  PLT 96* -- 101*    Basename 03/13/11 0445 03/12/11 2143 03/11/11 0605  NA 131* 136 --  K 4.3 4.3 --  CL 103 101 --  CO2 25 -- 27  GLUCOSE 132* 118* --  BUN 12 10 --  CREATININE 0.76 0.80 --   No results found for this basename: TROPONINI:2,CK,MB:2 in the last 72 hours Hepatic Function Panel  Basename 03/11/11 0605  PROT 6.7  ALBUMIN 3.2*  AST 17  ALT 16  ALKPHOS 62  BILITOT 0.4  BILIDIR --  IBILI --   No results found for this basename: CHOL in the last 72 hours No results found for this basename: PROTIME in the last 72 hours  Imaging: Dg Chest Portable 1 View In Am  03/13/2011  *RADIOLOGY REPORT*  Clinical Data: Postop CABG.  PORTABLE CHEST - 1 VIEW  Comparison: 03/04/2011  Findings: Changes of CABG.  Swan-Ganz catheter has been  pulled back with the tip now in the pulmonary outflow tract region.  Devices otherwise stable.  No pneumothorax.  Bibasilar atelectasis and vascular congestion.  Possible mild interstitial edema.  IMPRESSION: Swan-Ganz catheter tip now in the pulmonary outflow tract.  Increasing vascular congestion and bibasilar atelectasis.  Question mild interstitial edema.  Original Report Authenticated By: Cyndie Chime, M.D.   Dg Chest Portable 1 View  03/12/2011  *RADIOLOGY REPORT*  Clinical Data: Postop CABG  PORTABLE CHEST - 1 VIEW  Comparison: Chest x-ray of 03/11/2011  Findings: The tip of the endotracheal tube is approximately 4.6 cm above the carina.  A left chest tube is present and no pneumothorax is seen. There is probable linear atelectasis in the right mid lung.  A Swan-Ganz catheter is noted with the tip in the right main pulmonary artery.  Mild cardiomegaly is stable.  Median sternotomy sutures are noted.  IMPRESSION:  1.  Status post CABG, the endotracheal tube tip is 4.6 cm above the carina. 2.  Left chest tube present with no pneumothorax. 3.  Swan-Ganz catheter tip in right main pulmonary artery.  Original Report Authenticated By: Juline Patch, M.D.    EKG:  Junctional rhyhtm  (some av block on prior tracings)    Assessment/Plan:  Patient Active Hospital Problem List  Bradycardia    Had some av block  before surgery on metop, and now junctional rhythm post op.  Getting atrial pacing at present.      Suspect he may need to have metoprolol held at this point in time with perhaps amlodipine for hypertension substituted.   HYPERLIPIDEMIA-MIXED (09/20/2008)   Assessment: under treatment   Plan: see orders CAD, NATIVE VESSEL (09/20/2008)   Assessment: sp CABG   Plan: see op note Aortic stenosis (11/12/2008)   Assessment: sp avr   Plan: see op note        [name], MD, [facility], FSCAI 03/13/2011, 11:23 AM

## 2011-03-13 NOTE — Procedures (Deleted)
Extubation Procedure Note  Patient Details:   Name: Edward Pena DOB: 1926-04-23 MRN: [id number]   Airway Documentation:     Evaluation  O2 sats: stable throughout and currently acceptable Complications: No apparent complications Patient did tolerate procedure well. Bilateral Breath Sounds: Clear Suctioning: Airway Yes Pt  Awake and alert,  extubated per protocol, placed on 3L [place], sat 98%, BBS rh, positive cuff leak, NIF-28, VC 750, IS 600. Pt able to vocalize.  Arloa Koh 03/13/2011, 9:31 AM

## 2011-03-13 NOTE — Progress Notes (Signed)
UR Completed.  Terry [place] Jane [phone] 03/13/2011  

## 2011-03-13 NOTE — Progress Notes (Signed)
  SICU p.m. Note  Postop day 1 aVR CABG. The patient was extubated this morning. Hemodynamics remained stable. Good diuresis with Lasix. Neurologically intact. P.m. lab satisfactory.

## 2011-03-14 ENCOUNTER — Inpatient Hospital Stay (HOSPITAL_COMMUNITY): Payer: Medicare Other

## 2011-03-14 LAB — CBC
HCT: 26.7 % — ABNORMAL LOW (ref 39.0–52.0)
Hemoglobin: 9.3 g/dL — ABNORMAL LOW (ref 13.0–17.0)
MCH: 32.6 pg (ref 26.0–34.0)
MCHC: 34.8 g/dL (ref 30.0–36.0)
MCV: 93.7 fL (ref 78.0–100.0)
Platelets: 83 10*3/uL — ABNORMAL LOW (ref 150–400)
RBC: 2.85 MIL/uL — ABNORMAL LOW (ref 4.22–5.81)
RDW: 14 % (ref 11.5–15.5)
WBC: 15.2 10*3/uL — ABNORMAL HIGH (ref 4.0–10.5)

## 2011-03-14 LAB — BASIC METABOLIC PANEL
BUN: 21 mg/dL (ref 6–23)
CO2: 27 mEq/L (ref 19–32)
Calcium: 8 mg/dL — ABNORMAL LOW (ref 8.4–10.5)
Chloride: 99 mEq/L (ref 96–112)
Creatinine, Ser: 1.06 mg/dL (ref 0.50–1.35)
GFR calc Af Amer: 73 mL/min — ABNORMAL LOW (ref >90)
GFR calc non Af Amer: 63 mL/min — ABNORMAL LOW (ref >90)
Glucose, Bld: 147 mg/dL — ABNORMAL HIGH (ref 70–99)
Potassium: 4.3 mEq/L (ref 3.5–5.1)
Sodium: 132 mEq/L — ABNORMAL LOW (ref 135–145)

## 2011-03-14 LAB — TYPE AND SCREEN
ABO/RH(D): O POS
Antibody Screen: NEGATIVE
Unit division: 0
Unit division: 0
Unit division: 0
Unit division: 0

## 2011-03-14 LAB — GLUCOSE, CAPILLARY: Glucose-Capillary: 186 mg/dL — ABNORMAL HIGH (ref 70–99)

## 2011-03-14 MED ORDER — MOVING RIGHT ALONG BOOK
Freq: Once | Status: AC
  Administered 2011-03-14: 11:00:00
  Filled 2011-03-14: qty 1

## 2011-03-14 MED ORDER — SODIUM CHLORIDE 0.9 % IJ SOLN
3.0000 mL | INTRAMUSCULAR | Status: DC | PRN
  Administered 2011-03-16: 3 mL via INTRAVENOUS

## 2011-03-14 MED ORDER — BISACODYL 5 MG PO TBEC
10.0000 mg | DELAYED_RELEASE_TABLET | Freq: Every day | ORAL | Status: DC | PRN
  Administered 2011-03-14 – 2011-03-23 (×2): 10 mg via ORAL
  Filled 2011-03-14 (×2): qty 2

## 2011-03-14 MED ORDER — POVIDONE-IODINE 10 % EX OINT
TOPICAL_OINTMENT | Freq: Two times a day (BID) | CUTANEOUS | Status: DC
  Filled 2011-03-14: qty 28.35

## 2011-03-14 MED ORDER — PROMETHAZINE HCL 25 MG/ML IJ SOLN: 6.2500 mg | Freq: Four times a day (QID) | INTRAMUSCULAR | Status: DC | PRN

## 2011-03-14 MED ORDER — MAGNESIUM HYDROXIDE 400 MG/5ML PO SUSP: 30.0000 mL | Freq: Every day | ORAL | Status: DC | PRN

## 2011-03-14 MED ORDER — DOCUSATE SODIUM 100 MG PO CAPS
200.0000 mg | ORAL_CAPSULE | Freq: Every day | ORAL | Status: DC
  Administered 2011-03-14 – 2011-03-25 (×10): 200 mg via ORAL
  Filled 2011-03-14 (×12): qty 2

## 2011-03-14 MED ORDER — POVIDONE-IODINE 10 % EX SOLN
Freq: Two times a day (BID) | CUTANEOUS | Status: DC
  Administered 2011-03-15 – 2011-03-20 (×12): via TOPICAL
  Filled 2011-03-14: qty 118

## 2011-03-14 MED ORDER — LEVALBUTEROL HCL 0.63 MG/3ML IN NEBU
0.6300 mg | INHALATION_SOLUTION | Freq: Two times a day (BID) | RESPIRATORY_TRACT | Status: DC
  Administered 2011-03-14 – 2011-03-24 (×19): 0.63 mg via RESPIRATORY_TRACT
  Filled 2011-03-14 (×22): qty 3

## 2011-03-14 MED ORDER — ONDANSETRON HCL 4 MG/2ML IJ SOLN
4.0000 mg | Freq: Four times a day (QID) | INTRAMUSCULAR | Status: DC | PRN
  Administered 2011-03-14 – 2011-03-17 (×5): 4 mg via INTRAVENOUS
  Filled 2011-03-14 (×5): qty 2

## 2011-03-14 MED ORDER — FUROSEMIDE 40 MG PO TABS
40.0000 mg | ORAL_TABLET | Freq: Every day | ORAL | Status: DC
  Administered 2011-03-15 – 2011-03-25 (×11): 40 mg via ORAL
  Filled 2011-03-14 (×11): qty 1

## 2011-03-14 MED ORDER — SODIUM CHLORIDE 0.9 % IV SOLN
INTRAVENOUS | Status: DC | PRN
  Administered 2011-03-14: 10 mL/h via INTRAVENOUS

## 2011-03-14 MED ORDER — BISACODYL 10 MG RE SUPP
10.0000 mg | Freq: Every day | RECTAL | Status: DC | PRN
  Administered 2011-03-15: 10 mg via RECTAL
  Filled 2011-03-14: qty 1

## 2011-03-14 MED ORDER — ASPIRIN EC 81 MG PO TBEC
81.0000 mg | DELAYED_RELEASE_TABLET | Freq: Every day | ORAL | Status: DC
  Administered 2011-03-15 – 2011-03-25 (×11): 81 mg via ORAL
  Filled 2011-03-14 (×12): qty 1

## 2011-03-14 MED ORDER — INSULIN ASPART 100 UNIT/ML ~~LOC~~ SOLN
0.0000 [IU] | Freq: Three times a day (TID) | SUBCUTANEOUS | Status: DC
  Administered 2011-03-14: 3 [IU] via SUBCUTANEOUS
  Administered 2011-03-15 (×3): 2 [IU] via SUBCUTANEOUS

## 2011-03-14 MED ORDER — AMLODIPINE BESYLATE 5 MG PO TABS
5.0000 mg | ORAL_TABLET | Freq: Every day | ORAL | Status: DC
  Administered 2011-03-14: 5 mg via ORAL
  Filled 2011-03-14 (×3): qty 1

## 2011-03-14 MED ORDER — BENAZEPRIL HCL 10 MG PO TABS
10.0000 mg | ORAL_TABLET | Freq: Every day | ORAL | Status: DC
  Administered 2011-03-14: 10 mg via ORAL
  Filled 2011-03-14 (×3): qty 1

## 2011-03-14 MED ORDER — POTASSIUM CHLORIDE CRYS ER 20 MEQ PO TBCR
20.0000 meq | EXTENDED_RELEASE_TABLET | Freq: Every day | ORAL | Status: DC
  Administered 2011-03-14 – 2011-03-20 (×7): 20 meq via ORAL
  Filled 2011-03-14 (×8): qty 1

## 2011-03-14 MED ORDER — SODIUM CHLORIDE 0.9 % IJ SOLN
3.0000 mL | Freq: Two times a day (BID) | INTRAMUSCULAR | Status: DC
  Administered 2011-03-14 – 2011-03-25 (×19): 3 mL via INTRAVENOUS

## 2011-03-14 NOTE — Progress Notes (Signed)
All documentation and medication administration has been supervised, reviewed and is verified from 1900-0730 by myself.

## 2011-03-14 NOTE — Progress Notes (Signed)
Postop day 2 aVR, CABG x2 Normal sinus rhythm, stable blood pressure off drips, ambulating in the hallway.  Chest x-ray shows improved aeration minimal effusion stable cardiac silhouette.  Labs with stable hematocrit BUN/creatinine. Coumadin not plan for tissue aVR in this 78 year old gentleman.  Plan transfer to step down continue with diuresis physical therapy and ultimately discharged to home with home health support.-

## 2011-03-14 NOTE — Progress Notes (Signed)
Patient dangled before chest tubes pulled. Premedicated with 2 mg morphine. Procedure explained to Edward Pena and he verbalized an understanding of the procedure. Performed valsalva remover prior to removal and sutures tied per protocol. Tolerated procedure well. Equal, unchanged breath sounds after removal. PCXR to follow shortly.

## 2011-03-14 NOTE — Progress Notes (Signed)
[place] cards  Doing well Transferring.   Will follow. Post CABG.

## 2011-03-15 ENCOUNTER — Inpatient Hospital Stay (HOSPITAL_COMMUNITY): Payer: Medicare Other

## 2011-03-15 DIAGNOSIS — I2510 Atherosclerotic heart disease of native coronary artery without angina pectoris: Secondary | ICD-10-CM

## 2011-03-15 LAB — CBC
HCT: 26.5 % — ABNORMAL LOW (ref 39.0–52.0)
Hemoglobin: 8.9 g/dL — ABNORMAL LOW (ref 13.0–17.0)
MCH: 33.1 pg (ref 26.0–34.0)
MCHC: 33.6 g/dL (ref 30.0–36.0)
MCV: 98.5 fL (ref 78.0–100.0)
Platelets: 87 10*3/uL — ABNORMAL LOW (ref 150–400)
RBC: 2.69 MIL/uL — ABNORMAL LOW (ref 4.22–5.81)
RDW: 14 % (ref 11.5–15.5)
WBC: 13.6 10*3/uL — ABNORMAL HIGH (ref 4.0–10.5)

## 2011-03-15 LAB — BASIC METABOLIC PANEL
BUN: 24 mg/dL — ABNORMAL HIGH (ref 6–23)
CO2: 29 mEq/L (ref 19–32)
Calcium: 8.4 mg/dL (ref 8.4–10.5)
Chloride: 97 mEq/L (ref 96–112)
Creatinine, Ser: 1.04 mg/dL (ref 0.50–1.35)
GFR calc Af Amer: 75 mL/min — ABNORMAL LOW (ref >90)
GFR calc non Af Amer: 64 mL/min — ABNORMAL LOW (ref >90)
Glucose, Bld: 122 mg/dL — ABNORMAL HIGH (ref 70–99)
Potassium: 3.9 mEq/L (ref 3.5–5.1)
Sodium: 133 mEq/L — ABNORMAL LOW (ref 135–145)

## 2011-03-15 MED ORDER — BENAZEPRIL HCL 20 MG PO TABS: 20.0000 mg | ORAL_TABLET | Freq: Every day | ORAL | Status: DC

## 2011-03-15 MED ORDER — AMLODIPINE BESYLATE 10 MG PO TABS
10.0000 mg | ORAL_TABLET | Freq: Every day | ORAL | Status: DC
  Administered 2011-03-15 – 2011-03-25 (×11): 10 mg via ORAL
  Filled 2011-03-15 (×12): qty 1

## 2011-03-15 MED ORDER — FUROSEMIDE 10 MG/ML IJ SOLN
40.0000 mg | Freq: Once | INTRAMUSCULAR | Status: AC
  Administered 2011-03-15: 40 mg via INTRAVENOUS
  Filled 2011-03-15: qty 4

## 2011-03-15 MED ORDER — BENAZEPRIL HCL 20 MG PO TABS
20.0000 mg | ORAL_TABLET | Freq: Every day | ORAL | Status: DC
  Administered 2011-03-15 – 2011-03-20 (×6): 20 mg via ORAL
  Filled 2011-03-15 (×7): qty 1

## 2011-03-15 MED ORDER — AMLODIPINE BESYLATE 10 MG PO TABS: 10.0000 mg | ORAL_TABLET | Freq: Every day | ORAL | Status: DC

## 2011-03-15 NOTE — Progress Notes (Signed)
Pt ambulated 150 ft with wheelchair and 02 with assist X 2, sats were 90 % on 2 liters while ambulating. Pt states he still feels "weak". Will continue to encourage and monitor.  Martyn Malay, RN

## 2011-03-15 NOTE — Plan of Care (Signed)
Problem: Phase III Progression Outcomes Goal: Dysrhythmias controlled Outcome: Progressing Pt on external pacer

## 2011-03-15 NOTE — Progress Notes (Addendum)
                    [address]            [place] [address]          [phone]     3 Days Post-Op Procedure(s) (LRB): AORTIC VALVE REPLACEMENT (AVR) (N/A) CORONARY ARTERY BYPASS GRAFTING (CABG) (N/A)  Subjective: Having some nausea after taking Tylenol, otherwise feels ok.  Objective: Vital signs in last 24 hours: Patient Vitals for the past 24 hrs:  BP Temp Temp src Pulse Resp SpO2 Weight  03/15/11 0806 - - - - - 98 % -  03/15/11 0524 152/74 mmHg 99 F (37.2 C) Oral 87  19  96 % 92.806 kg (204 lb 9.6 oz)  03/14/11 2056 - - - - - 95 % -  03/14/11 2009 160/67 mmHg 98.4 F (36.9 C) Oral 87  19  96 % -  03/14/11 1604 167/63 mmHg 98.9 F (37.2 C) - 88  18  98 % -  03/14/11 1603 167/63 mmHg - - 88  20  97 % -  03/14/11 1530 - - - 88  12  94 % -  03/14/11 1500 181/52 mmHg - - 88  21  94 % -  03/14/11 1400 171/60 mmHg - - 77  20  94 % -  03/14/11 1300 156/60 mmHg - - 72  16  97 % -  03/14/11 1200 145/54 mmHg 98.6 F (37 C) Oral 72  17  97 % -  03/14/11 1100 132/81 mmHg - - 73  15  96 % -   Current Weight  03/15/11 92.806 kg (204 lb 9.6 oz)  PRE OP WEIGHT: 84 kg    Intake/Output from previous day: 03/02 0701 - 03/03 0700 In: 540 [P.O.:540] Out: 995 [Urine:995]    PHYSICAL EXAM:  Heart: brady under pacer Lungs: decreased BS bilaterally Wound: small amount sanguinous drainage mid sternum Extremities: +LE edema  Lab Results: CBC: Basename 03/15/11 0515 03/14/11 0400  WBC 13.6* 15.2*  HGB 8.9* 9.3*  HCT 26.5* 26.7*  PLT 87* 83*   BMET:  Basename 03/15/11 0515 03/14/11 0400  NA 133* 132*  K 3.9 4.3  CL 97 99  CO2 29 27  GLUCOSE 122* 147*  BUN 24* 21  CREATININE 1.04 1.06  CALCIUM 8.4 8.0*    PT/INR:  Basename 03/12/11 1620  LABPROT 22.3*  INR 1.92*  CXR stable, BB atx  Assessment/Plan: S/P Procedure(s) (LRB): AORTIC VALVE REPLACEMENT (AVR) (N/A) CORONARY ARTERY BYPASS GRAFTING (CABG) (N/A) CV- AAI paced at 88.  I turned it  down to 58 and he continued to pace.  Will leave pacer on for now. Not on beta blocker yet.  BPs up- will increase to  home dose Lotrel. Vol overload- diurese. CRPI, pulm toilet. ABL anemia- monitor.   LOS: 5 days    COLLINS,GINA H 03/15/2011   patient examined and medical record reviewed,agree with above note. VAN TRIGT III,Odaliz Mcqueary 03/15/2011

## 2011-03-16 ENCOUNTER — Inpatient Hospital Stay (HOSPITAL_COMMUNITY): Payer: Medicare Other

## 2011-03-16 LAB — CBC
HCT: 26.1 % — ABNORMAL LOW (ref 39.0–52.0)
Hemoglobin: 8.7 g/dL — ABNORMAL LOW (ref 13.0–17.0)
MCH: 32.6 pg (ref 26.0–34.0)
MCHC: 33.3 g/dL (ref 30.0–36.0)
MCV: 97.8 fL (ref 78.0–100.0)
Platelets: 99 10*3/uL — ABNORMAL LOW (ref 150–400)
RBC: 2.67 MIL/uL — ABNORMAL LOW (ref 4.22–5.81)
RDW: 13.7 % (ref 11.5–15.5)
WBC: 10.3 10*3/uL (ref 4.0–10.5)

## 2011-03-16 LAB — GLUCOSE, CAPILLARY

## 2011-03-16 LAB — BASIC METABOLIC PANEL
BUN: 22 mg/dL (ref 6–23)
CO2: 32 mEq/L (ref 19–32)
Calcium: 8.5 mg/dL (ref 8.4–10.5)
Chloride: 95 mEq/L — ABNORMAL LOW (ref 96–112)
Creatinine, Ser: 0.94 mg/dL (ref 0.50–1.35)
GFR calc Af Amer: 87 mL/min — ABNORMAL LOW (ref >90)
GFR calc non Af Amer: 75 mL/min — ABNORMAL LOW (ref >90)
Glucose, Bld: 119 mg/dL — ABNORMAL HIGH (ref 70–99)
Potassium: 3.7 mEq/L (ref 3.5–5.1)
Sodium: 134 mEq/L — ABNORMAL LOW (ref 135–145)

## 2011-03-16 MED ORDER — LACTULOSE 10 GM/15ML PO SOLN
30.0000 g | Freq: Every day | ORAL | Status: DC | PRN
  Filled 2011-03-16: qty 45

## 2011-03-16 MED ORDER — POTASSIUM CHLORIDE CRYS ER 20 MEQ PO TBCR
30.0000 meq | EXTENDED_RELEASE_TABLET | Freq: Once | ORAL | Status: DC
  Filled 2011-03-16: qty 1

## 2011-03-16 MED ORDER — FOLIC ACID 1 MG PO TABS
1.0000 mg | ORAL_TABLET | Freq: Every day | ORAL | Status: DC
  Administered 2011-03-16 – 2011-03-25 (×10): 1 mg via ORAL
  Filled 2011-03-16 (×10): qty 1

## 2011-03-16 MED ORDER — FUROSEMIDE 40 MG PO TABS
40.0000 mg | ORAL_TABLET | Freq: Once | ORAL | Status: AC
  Administered 2011-03-16: 40 mg via ORAL
  Filled 2011-03-16: qty 1

## 2011-03-16 MED ORDER — LACTULOSE 10 GM/15ML PO SOLN
20.0000 g | Freq: Once | ORAL | Status: AC
  Administered 2011-03-16: 20 g via ORAL
  Filled 2011-03-16: qty 30

## 2011-03-16 MED ORDER — POTASSIUM CHLORIDE CRYS ER 20 MEQ PO TBCR
20.0000 meq | EXTENDED_RELEASE_TABLET | Freq: Once | ORAL | Status: AC
  Administered 2011-03-16: 20 meq via ORAL

## 2011-03-16 NOTE — Progress Notes (Signed)
Stable except for pulmonary toilet. Rhythm appears to be non sensing atrial channel, so doubt that this lead is working.  He is overriding rhythm at present.  Continue to monitor, and pacer can probably be cut back and possibly stopped.  Rate is at 80.  Our team will follow  [name] 4:53 PM 03/16/2011

## 2011-03-16 NOTE — Progress Notes (Signed)
CARDIAC REHAB PHASE I   PRE:  Rate/Rhythm: 88Paced  BP:  Supine: 130/60  Sitting:   Standing:    SaO2: 97% 3L, 96% 2L  MODE:  Ambulation: 550 ft   POST:  Rate/Rhythem: 112  BP:  Supine:   Sitting: 130/62  Standing:    SaO2: 95% 2L hall, 93% 2L room 0810-0850 Pt walked 550 ft on 2L oxygen with rolling walker and asst x 2.  Pt sounds very congested and encouraged to cough up phlegm. Had to sit in chair in hall once due to nausea and weakness. Sats good on 2L. Coughed productively once during walk. To recliner after walk with call bell.  Edward Pena

## 2011-03-16 NOTE — Progress Notes (Addendum)
4 Days Post-Op Procedure(s) (LRB): AORTIC VALVE REPLACEMENT (AVR) (N/A) CORONARY ARTERY BYPASS GRAFTING (CABG) (N/A)  Subjective: Patient has nausea this am.Denies abdominal pain and emesis. Small bowel movement yesterday.  Objective: Vital signs in last 24 hours: Patient Vitals for the past 24 hrs:  BP Temp Temp src Pulse Resp SpO2 Weight  03/16/11 0453 - - - - - - 204 lb 2.3 oz (92.6 kg)  03/16/11 0429 140/70 mmHg 97.1 F (36.2 C) Oral 88  18  98 % -  03/15/11 2109 143/70 mmHg 98 F (36.7 C) Oral 87  16  92 % -  03/15/11 2015 - - - - - 91 % -  03/15/11 1527 153/52 mmHg 99.4 F (37.4 C) - 88  20  92 % -  03/15/11 1415 142/56 mmHg 98.9 F (37.2 C) - 88  18  93 % -  03/15/11 1113 164/63 mmHg - - 88  20  97 % -  03/15/11 1100 - - - - - 97 % -  03/15/11 0806 - - - - - 98 % -   Pre op weight  83.5 kg Current Weight  03/16/11 204 lb 2.3 oz (92.6 kg)      Intake/Output from previous day: 03/03 0701 - 03/04 0700 In: 480 [P.O.:480] Out: 1050 [Urine:1050]   Physical Exam:  Cardiovascular: RRR;no murmurs, gallops, or rubs. Pulmonary:Diminshed at the bases; no rales, wheezes, or rhonchi. Abdomen: Soft, non tender, distended,occasional bowel sounds . Extremities: Mild bilateral lower extremity edema. Wounds: Clean and dry.  No erythema or signs of infection.  Lab Results: CBC: Basename 03/16/11 0515 03/15/11 0515  WBC 10.3 13.6*  HGB 8.7* 8.9*  HCT 26.1* 26.5*  PLT 99* 87*   BMET:  Basename 03/15/11 0515 03/14/11 0400  NA 133* 132*  K 3.9 4.3  CL 97 99  CO2 29 27  GLUCOSE 122* 147*  BUN 24* 21  CREATININE 1.04 1.06  CALCIUM 8.4 8.0*    PT/INR: No results found for this basename: LABPROT,INR in the last 72 hours ABG:  INR: Will add last result for INR, ABG once components are confirmed Will add last 4 CBG results once components are confirmed  Assessment/Plan:  1. CV - Afib with RVR 2:50 am.Apaced.Not on BB yet. Continue Norvasc/Benazepril. 2.  Pulmonary -  Encourage incentive spirometer and flutter valve.Wean O2. 3. Volume Overload - Continue with diuresis.Will give additional Lasix today. 4.  Acute blood loss anemia - H/H this am 8.7/26.1.  Start folic acid.Will not start Nu Iron as has nausea. 5.Thrombocytopenia-platelets increased from 87 to 99,000. 6.GI-KUB this am shows gas throughout, no evidence of obstruction or free air. 7.Supplement potassium. 8.Pre op HGA1C 5.7. CBGs 134/139/110.Stop SS and CBGs.   ZIMMERMAN,DONIELLE MPA-C 03/16/2011   patient examined and medical record reviewed,agree with above note. VAN TRIGT III,PETER 03/16/2011

## 2011-03-16 NOTE — Progress Notes (Addendum)
Pt ambulated 550 ft with walker on 2L oxygen.  Pt had to sit down after 418ft for but wanted to continue walking. Pt returned to bed. No complaints or signs of distress. Oxygen transferred back to 2L room oxygen. Call light within reach and bed alarm on. No requests. Will continue to monitor

## 2011-03-16 NOTE — Progress Notes (Signed)
   CARE MANAGEMENT NOTE 03/16/2011  Patient:  Edward Pena, Edward Pena   Account Number:  [id number]  Date Initiated:  03/13/2011  Documentation initiated by:  [facility]  Subjective/Objective Assessment:   AVR and CABG x2 on 03-12-11.  Has spouse.     Action/Plan:   PTA, PT INDEPENDENT, LIVES WITH SPOUSE.   Anticipated DC Date:  03/17/2011   Anticipated DC Plan:  HOME W HOME HEALTH SERVICES      DC Planning Services  CM consult      Choice offered to / List presented to:             Status of service:  Completed, signed off Medicare Important Message given?   (If response is "NO", the following Medicare IM given date fields will be blank) Date Medicare IM given:   Date Additional Medicare IM given:    Discharge Disposition:    Per UR Regulation:  Reviewed for med. necessity/level of care/duration of stay  Comments:  03/16/11 Sharai Overbay,RN,BSN 1145 MET WITH PT TO DISCUSS DC PLANS.  PT STATES SPOUSE WILL PROVIDE 24HR CARE AT DC.  HE HAS RW AT HOME, IF NEEDED AT DC.  WILL FOLLOW FOR HOME NEEDS AS PT PROGRESSES. Phone #[phone]

## 2011-03-16 NOTE — Evaluation (Signed)
Physical Therapy Evaluation Patient Details Name: Edward Pena MRN: [id number] DOB: 12/21/1926 Today's Date: 03/16/2011  Problem List:  Patient Active Problem List  Diagnoses  . HYPERLIPIDEMIA-MIXED  . ESSENTIAL HYPERTENSION, BENIGN  . CAD, NATIVE VESSEL  . Aortic stenosis  . Bradycardia    Past Medical History:  Past Medical History  Diagnosis Date  . Coronary atherosclerosis of native coronary artery     DES LAD 10/07  . Carotid artery disease     Mild  . Essential hypertension, benign   . Mixed hyperlipidemia   . Aortic stenosis   . Aortic regurgitation   . Type 2 diabetes mellitus   . Peptic ulcer disease   . Blood transfusion   . GERD (gastroesophageal reflux disease)    Past Surgical History:  Past Surgical History  Procedure Date  . Appendectomy     PT Assessment/Plan/Recommendation PT Assessment Clinical Impression Statement: [name]/p CABG and AVR POD #4. Presents to PT today with below problem list. Will benefit physical therapy in the acute setting to address these and to maximize function and indepdences so as to decrease burden of care and increase safety at home. Rec HHPT for f/u with initially 24 hour assist during mobilty. Attempted ambulation without supplemental O2 and pt's sats dropped to 79% with inability to recover with rest breaks, O2 returned to nose and pt sitting in recliner sats in the 90s on 2 liters.  PT Recommendation/Assessment: Patient will need skilled PT in the acute care venue PT Problem List: Decreased activity tolerance;Decreased balance;Decreased mobility;Cardiopulmonary status limiting activity;Decreased knowledge of use of DME PT Therapy Diagnosis : Difficulty walking;Abnormality of gait;Generalized weakness PT Plan PT Frequency: Min 3X/week PT Treatment/Interventions: DME instruction;Gait training;Stair training;Functional mobility training;Therapeutic activities;Therapeutic exercise;Balance training;Neuromuscular  re-education;Patient/family education PT Recommendation Recommendations for Other Services: OT consult Follow Up Recommendations: Home health PT;Supervision for mobility/OOB Equipment Recommended: None recommended by PT PT Goals  Acute Rehab PT Goals PT Goal Formulation: With patient Pt will Roll Supine to Right Side: with modified independence PT Goal: Rolling Supine to Right Side - Progress: Goal set today Pt will Roll Supine to Left Side: with modified independence PT Goal: Rolling Supine to Left Side - Progress: Goal set today Pt will go Supine/Side to Sit: with modified independence PT Goal: Supine/Side to Sit - Progress: Goal set today Pt will go Sit to Supine/Side: with modified independence PT Goal: Sit to Supine/Side - Progress: Goal set today Pt will go Sit to Stand: with modified independence PT Goal: Sit to Stand - Progress: Goal set today Pt will go Stand to Sit: with modified independence PT Goal: Stand to Sit - Progress: Goal set today Pt will Transfer Bed to Chair/Chair to Bed: with modified independence PT Transfer Goal: Bed to Chair/Chair to Bed - Progress: Goal set today Pt will Stand: with modified independence PT Goal: Stand - Progress: Goal set today Pt will Ambulate: >150 feet;with modified independence;with least restrictive assistive device PT Goal: Ambulate - Progress: Goal set today Pt will Go Up / Down Stairs: 1-2 stairs;with min assist;with rail(s);with least restrictive assistive device PT Goal: Up/Down Stairs - Progress: Goal set today Pt will Perform Home Exercise Program: Independently PT Goal: Perform Home Exercise Program - Progress: Goal set today  PT Evaluation Precautions/Restrictions  Precautions Precautions: Sternal;Fall Prior Functioning  Home Living Lives With: Spouse Receives Help From: Family Type of Home: House Home Layout: One level Home Access: Stairs to enter Entrance Stairs-Rails: Right Entrance Stairs-Number of Steps:  2 Bathroom  Shower/Tub: Walk-in shower;Door (3 inch step up) Home Adaptive Equipment: Walker - rolling;Straight cane;Bedside commode/3-in-1;Built-in shower seat Prior Function Level of Independence: Independent with basic ADLs;Independent with homemaking with ambulation;Independent with transfers;Requires assistive device for independence Driving: Yes Vocation: Retired Financial risk analyst Arousal/Alertness: Awake/alert Overall Cognitive Status: Appears within functional limits for tasks assessed Orientation Level: Oriented X4 Sensation/Coordination Sensation Light Touch: Appears Intact Coordination Gross Motor Movements are Fluid and Coordinated: Yes Fine Motor Movements are Fluid and Coordinated: Yes Extremity Assessment RUE Assessment RUE Assessment: Within Functional Limits LUE Assessment LUE Assessment: Within Functional Limits RLE Assessment RLE Assessment: Within Functional Limits (generally weak following surgery) LLE Assessment LLE Assessment: Within Functional Limits (generally weak following surgery) Mobility (including Balance) Bed Mobility Bed Mobility: No (pt upright in chair) Transfers Transfers: Yes Sit to Stand: 4: Min assist;Without upper extremity assist;From chair/3-in-1 Sit to Stand Details (indicate cue type and reason): min for sequencing specifically for safe hand placement and follow through to stand Stand to Sit: To chair/3-in-1;4: Min assist Stand to Sit Details: cues for safe technique Ambulation/Gait Ambulation/Gait: Yes Ambulation/Gait Assistance: 4: Min assist Ambulation/Gait Assistance Details (indicate cue type and reason): flexed trunk, short shuffled steps; cues for safety with RW especially when turning Ambulation Distance (Feet): 20 Feet Assistive device: Rolling walker    Exercise    End of Session  Pt left in chair/recliner with call bell in reach. RN aware of session details.   [facility] HELEN 03/16/2011, 2:32 PM

## 2011-03-17 ENCOUNTER — Encounter (HOSPITAL_COMMUNITY): Payer: Self-pay | Admitting: Cardiothoracic Surgery

## 2011-03-17 ENCOUNTER — Inpatient Hospital Stay (HOSPITAL_COMMUNITY): Payer: Medicare Other

## 2011-03-17 LAB — HEPATIC FUNCTION PANEL
ALT: 130 U/L — ABNORMAL HIGH (ref 0–53)
AST: 149 U/L — ABNORMAL HIGH (ref 0–37)
Albumin: 2.4 g/dL — ABNORMAL LOW (ref 3.5–5.2)
Alkaline Phosphatase: 198 U/L — ABNORMAL HIGH (ref 39–117)
Bilirubin, Direct: 0.2 mg/dL (ref 0.0–0.3)
Indirect Bilirubin: 0.4 mg/dL (ref 0.3–0.9)
Total Bilirubin: 0.6 mg/dL (ref 0.3–1.2)
Total Protein: 5.8 g/dL — ABNORMAL LOW (ref 6.0–8.3)

## 2011-03-17 LAB — AMYLASE: Amylase: 64 U/L (ref 0–105)

## 2011-03-17 LAB — GLUCOSE, CAPILLARY: Glucose-Capillary: 156 mg/dL — ABNORMAL HIGH (ref 70–99)

## 2011-03-17 MED ORDER — PANTOPRAZOLE SODIUM 40 MG PO PACK
40.0000 mg | PACK | Freq: Every day | ORAL | Status: DC
  Filled 2011-03-17: qty 20

## 2011-03-17 MED ORDER — FUROSEMIDE 40 MG PO TABS
40.0000 mg | ORAL_TABLET | Freq: Once | ORAL | Status: AC
  Administered 2011-03-17: 40 mg via ORAL
  Filled 2011-03-17: qty 1

## 2011-03-17 MED ORDER — POTASSIUM CHLORIDE CRYS ER 20 MEQ PO TBCR
20.0000 meq | EXTENDED_RELEASE_TABLET | Freq: Once | ORAL | Status: AC
  Administered 2011-03-17: 20 meq via ORAL

## 2011-03-17 MED ORDER — METOCLOPRAMIDE HCL 5 MG PO TABS
5.0000 mg | ORAL_TABLET | Freq: Three times a day (TID) | ORAL | Status: AC
  Administered 2011-03-17 – 2011-03-19 (×8): 5 mg via ORAL
  Filled 2011-03-17 (×8): qty 1

## 2011-03-17 MED ORDER — PANTOPRAZOLE SODIUM 40 MG PO TBEC
40.0000 mg | DELAYED_RELEASE_TABLET | Freq: Every day | ORAL | Status: DC
  Administered 2011-03-17 – 2011-03-24 (×8): 40 mg via ORAL
  Filled 2011-03-17 (×6): qty 1

## 2011-03-17 MED FILL — Electrolyte-R (PH 7.4) Solution: INTRAVENOUS | Qty: 6000 | Status: AC

## 2011-03-17 MED FILL — Lidocaine HCl IV Inj 20 MG/ML: INTRAVENOUS | Qty: 2 | Status: AC

## 2011-03-17 MED FILL — Sodium Bicarbonate IV Soln 8.4%: INTRAVENOUS | Qty: 50 | Status: AC

## 2011-03-17 MED FILL — Sodium Chloride IV Soln 0.9%: INTRAVENOUS | Qty: 2000 | Status: AC

## 2011-03-17 MED FILL — Sodium Chloride Irrigation Soln 0.9%: Qty: 3000 | Status: AC

## 2011-03-17 MED FILL — Heparin Sodium (Porcine) Inj 1000 Unit/ML: INTRAMUSCULAR | Qty: 30 | Status: AC

## 2011-03-17 MED FILL — Mannitol IV Soln 20%: INTRAVENOUS | Qty: 500 | Status: AC

## 2011-03-17 MED FILL — Heparin Sodium (Porcine) Inj 1000 Unit/ML: INTRAMUSCULAR | Qty: 10 | Status: AC

## 2011-03-17 NOTE — Progress Notes (Addendum)
CARDIAC REHAB PHASE I   PRE:  Rate/Rhythm: 77 SR    BP: sitting 152/49    SaO2: 85 RA, 92 2L  MODE:  Ambulation: 300 ft   POST:  Rate/Rhythm: 98 SR    BP: sitting 170/58     SaO2: 93 2L  Pt continues with nausea. Assist x2 with rw and 2l o2. Less wheeze and congestion today but nausea limited him. Return to chair. Remained in sr.  [id number]  Ethelda Chick CES, ACSM

## 2011-03-17 NOTE — Progress Notes (Addendum)
5 Days Post-Op Procedure(s) (LRB): AORTIC VALVE REPLACEMENT (AVR) (N/A) CORONARY ARTERY BYPASS GRAFTING (CABG) (N/A)  Subjective: Patient with a bowel movement this am. Not feeling so good as has nausea again.  Objective: Vital signs in last 24 hours: Patient Vitals for the past 24 hrs:  BP Temp Temp src Pulse Resp SpO2 Weight  03/17/11 0407 145/66 mmHg 98.6 F (37 C) Oral 78  14  95 % 200 lb 13.4 oz (91.1 kg)  03/16/11 1926 148/64 mmHg 97.3 F (36.3 C) Oral 79  20  96 % -  03/16/11 1440 134/67 mmHg 98.8 F (37.1 C) Oral 87  18  95 % -  03/16/11 1353 - - - 85  - - -  03/16/11 1035 - - - - - 97 % -   Pre op weight  83.5 kg Current Weight  03/17/11 200 lb 13.4 oz (91.1 kg)      Intake/Output from previous day: 03/04 0701 - 03/05 0700 In: 723 [P.O.:720; I.V.:3] Out: 450 [Urine:450]   Physical Exam:  Cardiovascular: RRR;no murmurs, gallops, or rubs. Pulmonary:Diminshed at the bases; no rales, wheezes, or rhonchi. Abdomen: Soft, non tender, distended,occasional bowel sounds . Extremities: Mild bilateral lower extremity edema. Wounds: Right lower extremity wound is clean and dry.There is scant drainage from lower portion of sternal wound. No erythema.  Lab Results: CBC:  Basename 03/16/11 0515 03/15/11 0515  WBC 10.3 13.6*  HGB 8.7* 8.9*  HCT 26.1* 26.5*  PLT 99* 87*   BMET:   Basename 03/16/11 0515 03/15/11 0515  NA 134* 133*  K 3.7 3.9  CL 95* 97  CO2 32 29  GLUCOSE 119* 122*  BUN 22 24*  CREATININE 0.94 1.04  CALCIUM 8.5 8.4    PT/INR: No results found for this basename: LABPROT,INR in the last 72 hours ABG:  INR: Will add last result for INR, ABG once components are confirmed Will add last 4 CBG results once components are confirmed  Assessment/Plan:  1. CV - PAfib with CVR this am.Previously Apaced so not on BB yet. HR now in 70's possibly start low dose BB soon.Continue Norvasc/Benazepril.May need to consider coumadin if remains in afib. 2.   Pulmonary - Encourage incentive spirometer and flutter valve.Wean O2. 3. Volume Overload - Continue with diuresis.Will give additional Lasix today. 4.  Acute blood loss anemia - Last H/H  8.7/26.1.  Continue folic acid.Will not start Nu Iron as has nausea. 5.Thrombocytopenia-platelets increased from 87 to 99,000. 6.GI-still with nausea.No other medications appear to be the etiology.Will give Reglan this am. 7.Recheck am labs  Patient has been complaining of nausea and has a history of ulcer disease and is status post vagotomy pyloroplasty 20 years ago. I cannot see that he is on protonix so I'll add that. We'll also check an amylase, hepatic panel, and ultrasound of the gallbladder tomorrow-ordered as ultrasound of abdomen-complete in EPIC. ZIMMERMAN,DONIELLE MPA-C 03/17/2011

## 2011-03-17 NOTE — Progress Notes (Signed)
Stable except for nausea and poor appetite.  Pacemaker back up rate was decreased to 60 bpm. He seems to be in NSR.    Lorine Bears 1:42 PM 03/17/2011

## 2011-03-18 LAB — BASIC METABOLIC PANEL
CO2: 31 mEq/L (ref 19–32)
Chloride: 95 mEq/L — ABNORMAL LOW (ref 96–112)
Creatinine, Ser: 0.91 mg/dL (ref 0.50–1.35)
GFR calc Af Amer: 88 mL/min — ABNORMAL LOW (ref >90)
Potassium: 3.9 mEq/L (ref 3.5–5.1)
Sodium: 131 mEq/L — ABNORMAL LOW (ref 135–145)

## 2011-03-18 LAB — CBC
MCV: 98.9 fL (ref 78.0–100.0)
Platelets: 159 10*3/uL (ref 150–400)
RBC: 2.61 MIL/uL — ABNORMAL LOW (ref 4.22–5.81)
WBC: 8.3 10*3/uL (ref 4.0–10.5)

## 2011-03-18 MED ORDER — FUROSEMIDE 40 MG PO TABS: 40.0000 mg | ORAL_TABLET | Freq: Every day | ORAL | Status: DC

## 2011-03-18 MED ORDER — POTASSIUM CHLORIDE 10 MEQ PO TBCR: 20.0000 meq | EXTENDED_RELEASE_TABLET | ORAL | Status: DC | PRN

## 2011-03-18 MED ORDER — POTASSIUM CHLORIDE CRYS ER 20 MEQ PO TBCR
30.0000 meq | EXTENDED_RELEASE_TABLET | Freq: Once | ORAL | Status: AC
  Administered 2011-03-18: 30 meq via ORAL
  Filled 2011-03-18: qty 1

## 2011-03-18 MED ORDER — TRAMADOL HCL 50 MG PO TABS: 50.0000 mg | ORAL_TABLET | Freq: Four times a day (QID) | ORAL | Status: AC | PRN

## 2011-03-18 MED ORDER — FOLIC ACID 1 MG PO TABS: 1.0000 mg | ORAL_TABLET | Freq: Every day | ORAL | Status: DC

## 2011-03-18 MED ORDER — PANTOPRAZOLE SODIUM 40 MG PO TBEC: 40.0000 mg | DELAYED_RELEASE_TABLET | Freq: Every day | ORAL | Status: DC

## 2011-03-18 MED ORDER — FUROSEMIDE 40 MG PO TABS
40.0000 mg | ORAL_TABLET | Freq: Once | ORAL | Status: AC
  Administered 2011-03-18: 40 mg via ORAL
  Filled 2011-03-18: qty 1

## 2011-03-18 MED ORDER — POTASSIUM CHLORIDE CRYS ER 20 MEQ PO TBCR: 20.0000 meq | EXTENDED_RELEASE_TABLET | Freq: Once | ORAL | Status: DC

## 2011-03-18 NOTE — Progress Notes (Signed)
Physical Therapy Treatment Patient Details Name: Edward Pena MRN: [id number] DOB: [birth year] Today's Date: 03/18/2011  PT Assessment/Plan  PT - Assessment/Plan Comments on Treatment Session: Cues today for pursed lip breathing. Fatigued quickly with exercises. Attempted ambulation today without O2 but sats dropped low 80s needing supplemental O2 (2 liters) to be above 90%.  PT Plan: Discharge plan remains appropriate;Frequency remains appropriate Recommendations for Other Services: OT consult Follow Up Recommendations: Home health PT;Supervision for mobility/OOB Equipment Recommended: None recommended by PT PT Goals  Acute Rehab PT Goals PT Goal: Sit to Stand - Progress: Progressing toward goal PT Goal: Stand to Sit - Progress: Progressing toward goal PT Goal: Stand - Progress: Progressing toward goal PT Goal: Ambulate - Progress: Progressing toward goal PT Goal: Perform Home Exercise Program - Progress: Progressing toward goal  PT Treatment Precautions/Restrictions  Precautions Precautions: Sternal;Fall Restrictions Weight Bearing Restrictions: No Mobility (including Balance) Bed Mobility Bed Mobility: No Transfers Sit to Stand: 4: Min assist;Without upper extremity assist;From chair/3-in-1 Sit to Stand Details (indicate cue type and reason): min for technique; pt with difficulty not using hands with 3 attempts and final attempt successful with minA facilitation  Stand to Sit: Without upper extremity assist;To chair/3-in-1 Stand to Sit Details: cues for controlled descent to chair  Ambulation/Gait Ambulation/Gait Assistance Details (indicate cue type and reason): mingaurdA for amb with RW v/c's for technique with RW and upright posture; pt with very quick gait speed Ambulation Distance (Feet): 250 Feet Assistive device: Rolling walker Stairs: No    Exercise  General Exercises - Lower Extremity Long Arc Quad: AROM;Both;10 reps;Seated Hip Flexion/Marching: AROM;Both;10  reps;Standing Toe Raises: AROM;10 reps;Both;Seated Heel Raises: AROM;Both;10 reps;Seated End of Session PT - End of Session Equipment Utilized During Treatment: Gait belt Activity Tolerance: Patient tolerated treatment well;Patient limited by fatigue Patient left: in chair;with call bell in reach Nurse Communication: Mobility status for transfers;Mobility status for ambulation General Behavior During Session: [facility] for tasks performed Cognition: [facility] for tasks performed  [facility] HELEN 03/18/2011, 3:50 PM

## 2011-03-18 NOTE — Discharge Summary (Signed)
Physician Discharge Summary  Patient ID: Edward Pena MRN: [id number] DOB/AGE: 1926-03-08 78 y.o.  Admit date: 03/10/2011 Discharge date: 03/19/2011  Admission Diagnoses: 1.history of aortic stenosis- now severe 2.History of mild aortic regurgitation 3.History of CAD (s/p PCI with DES to LAD 07', RCA) 4.History of mild carotid artery disease 5.History of hypertension 6.History of hyperlipidemia 7.Hitsory of DM 8.History of peptic ulcer disease  Discharge Diagnoses:  1.History of aortic stenosis- now severe 2.History of mild aortic regurgitation 3.History of CAD (s/p PCI with DES to LAD 07',RCA) 4.History of mild carotid artery disease 5.History of hypertension 6.History of hyperlipidemia 7.Hitsory of DM 8.History of peptic ulcer disease 9.Post op Bradycardia 10. ABL anemia 11.Post op PAF 12.Thrombocytopenia (resolved prior to discharge)   Procedure (s):  1.Coronary angiography done by Dr. Riley Kill on 03/10/2011 Coronary dominance: right  Left mainstem: calcified proximally without significant obstruction, and a large caliber vessel  Left anterior descending (LAD): Calcified vessel with mild luminal irregularity throughout. There are two stents in the LAD with less than 30% narrowing. There is a 50% stenosis in the LAD between the stents. There is 60% narrowing of the small first optional diagonal or ramus vessel. The second diagonal has about 50% narrowing. The LAD wraps the apex.  Left circumflex (LCx): The circumflex has two large OM vessels. The OM 1 has 70% proximally narrowing and the distal vessel is suitable for grafting  Right coronary artery (RCA): The RCA is calcified and appears to be previously stented as well. There is mild luminal irregularity throughout without significant focal narrowing. There is a modest sized PDA with 30-40% mid narrowing and smaller branches constitute the PLA system.  Final Conclusions:  (1). Patent stents in the mid LAD with other findings as  noted.  (2). Patent RCA stents  (3). Moderately high grade stenosis in the proximal OM 1  (4). Moderate AI  (5). Severe AS by echo   2.1. Aortic valve replacement with a 21-mm pericardial Magna Ease valve  (serial [id number]).  2. Coronary artery bypass grafting x2 (left internal mammary artery to  distal LAD, saphenous vein graft to first obtuse marginal).  3. Endoscopic harvest of right leg greater saphenous vein.  4. Placement of left femoral artery line for blood pressure  Monitoring by Dr. Donata Clay on 03/12/2011   History of Presenting Illness: The patient is an 78 year old Caucasian male with a long history of cardiac murmur followed with serial 2-D echo by Dr. Butch Penny and Dr. Diona Browner in [place]. Recently, the patient has experienced decreased exercise tolerance and shortness of breath with exertion. A repeat echo showed increase in transvalvular aortic gradient with a peak gradient greater than 70 mm mercury. There is also mild aortic insufficiency no significant MR was noted, and LV function was well-preserved. The patient felt to be a probable candidate for aortic replacement; however, surgical consultation was requested prior to catheterization. He was seen by Dr. Donata Clay in the office on 03/04/2011.Dr. Donata Clay and Dr. Diona Browner then decided that the patient would be admitted to [facility] on 03/10/2011, in order to undergo cardiac catheterization. He would then remain in the hospital for his aortic valve replacement and possible coronary bypass grafting surgery.       It should be noted that the patient has a history of CAD and had drug-eluting stents placed in the LAD at [facility] in 2007. The patient has had no recurrent angina or problems and has not been catheterized since the drug-eluting stent placed.  He was admitted to [facility] on 03/10/2011 in order to undergo cardiac catheterization.  Results indicated patent stents stents in the mid LAD, patent RCA stents,  moderately high-grade stenosis in the proximal OM1, moderate AI, and severe AS (by echo). Preoperative carotid duplex carotid ultrasound showed no evidence of significant internal carotid artery stenosis bilaterally. Dr. Donata Clay then discussed with the patient the need for an aortic valve replacement, as well as coronary artery bypass grafting surgery. Potential risks, benefits, and complications were discussed with the patient.He agreed to proceed. He underwent aortic valve replacement and CABG x2 on 03/12/2011.   Brief Hospital Course:  He remained afebrile and hemodynamically stable. He was extubated successfully the morning of surgery. He did require AAI pacing. He also had bradycardia and was therefore not placed on a beta blocker. His Swan-Ganz, A-line, chest tubes, and Foley were all removed early in his postoperative course. He was lying overloaded and diuresed accordingly. He was found to have thrombocytopenia postoperatively. Platelet count was low as 83,000. However he did have resolution of this with his last platelet count 159,000. He was found have acute blood loss anemia. His H&H went as low 8.4/23.2. He did not require postoperative transfusion. His last H&H was up to 8.5 and 25.8. He was not placed on Nu-Iron secondary to persistent nausea. He did have some abdominal distention. He had no abdominal pain or emesis, but as previously stated did have nausea for a couple of days. This was relieved with Zofran and Reglan. He also had constipation and was given a few laxatives. He then had several bowel movements. In addition, a KUB was obtained which was essentially negative. Amylase was within normal limits.LFTs were however were elevated. As a result, his statin was discontinued.He most likely had an ileus, which later did resolve. He was felt surgically stable for transfer from the intensive care to [facility] for further convalescence on 03/14/2011. He continued to progress with cardiac  rehabilitation. He was still was requiring a couple of liters of oxygen via nasal cannula and will most likely require home O2 upon discharge. He did have hypertension postoperatively and his medications were adjusted accordingly.He also developed atrial fibrillation with RVR the morning of 3/4. He then converted to this sinus bradycardia/sinus rhythm.He continued to be on his external pacer as backup. He was eventually taken off the external pacer in his heart remained in the 70s to 80s. He has already been  tolerating a diet; although he does not have much appetite. Friday he remains afebrile, hemodynamic a stable, and pending morning round evaluation, and surgically stable for discharge on 03/19/2011.   Filed Vitals:   03/18/11 0405  BP: 141/66  Pulse: 75  Temp: 98.3 F (36.8 C)  Resp: 18     Latest Vital Signs: Blood pressure 141/66, pulse 75, temperature 98.3 F (36.8 C), temperature source Oral, resp. rate 18, height 5\' 10"  (1.778 m), weight 183 lb 3.2 oz (83.1 kg), SpO2 93.00%.  Physical Exam: Cardiovascular: RRR;no murmurs, gallops, or rubs.  Pulmonary:Diminshed at the bases; no rales, wheezes, or rhonchi.  Abdomen: Soft, non tender, distended,occasional bowel sounds .  Extremities: Mild bilateral lower extremity edema.  Wounds: Clean and dry. No signs of infection.   Discharge Condition:Stable  Recent laboratory studies:  Lab Results  Component Value Date   WBC 8.3 03/18/2011   HGB 8.5* 03/18/2011   HCT 25.8* 03/18/2011   MCV 98.9 03/18/2011   PLT 159 03/18/2011   Lab Results  Component Value  Date   NA 131* 03/18/2011   K 3.9 03/18/2011   CL 95* 03/18/2011   CO2 31 03/18/2011   CREATININE 0.91 03/18/2011   GLUCOSE 128* 03/18/2011      Diagnostic Studies: Dg Orthopantogram  Dg Chest 2 View  03/15/2011  *RADIOLOGY REPORT*  Clinical Data: Postop CABG.  Chest pain and shortness of breath.  CHEST - 2 VIEW  Comparison: 03/14/2011 and 03/11/2011.  Findings: The heart size and mediastinal  contours are stable status post CABG and AVR.  The right IJ sheath has been removed.  There are persistent small bilateral pleural effusions with associated mild bibasilar atelectasis.  No edema or pneumothorax is seen.  IMPRESSION: No significant change in small pleural effusions and mild bibasilar atelectasis.  Original Report Authenticated By: Gerrianne Scale, M.D.    US Abdomen Complete  03/17/2011  *RADIOLOGY REPORT*  Clinical Data:  Abdominal pain and right upper quadrant tenderness. Nausea.  COMPLETE ABDOMINAL ULTRASOUND  Comparison:  None.  Findings:  Gallbladder:  No gallstones, gallbladder wall thickening, or pericholecystic fluid. Negative sonographic Murphy's sign.  Common bile duct:  Normal.  4 mm in diameter.  Liver:  Normal.  IVC:  Normal.  Pancreas:  Obscured by bowel gas.  Spleen:  Normal.  5.7 cm in length.  Right Kidney:  Normal.  11.9 cm in length.  Left Kidney:  Normal.  12.1 cm in length.  Abdominal aorta:  Maximal diameter 2.2 cm.  Portions of the aorta are obscured by bowel gas.  IMPRESSION: Negative abdominal ultrasound. The pancreas is not visualized.  Original Report Authenticated By: Gwynn Burly, M.D.    Discharge Orders    Future Appointments: Provider: Department: Dept Phone: Center:   04/08/2011 12:30 PM Mikey Bussing, MD [facility] [phone] [facility]   04/13/2011 9:40 AM Jonelle Sidle, MD [facility] [phone] [facility]      Discharge Medications: Medication List  As of 03/18/2011  9:32 AM   STOP taking these medications         simvastatin 40 MG tablet         TAKE these medications         amLODipine-benazepril 10-20 MG per capsule   Commonly known as: LOTREL   Take 1 capsule by mouth daily.      aspirin 81 MG tablet   Take 81 mg by mouth daily.      folic acid 1 MG tablet   Commonly known as: FOLVITE   Take 1 tablet (1 mg total) by mouth daily. For one month then stop.      furosemide 40 MG tablet   Commonly known as:  LASIX   Take 1 tablet (40 mg total) by mouth daily. For 5 days then stop.      metoprolol 50 MG tablet   Commonly known as: LOPRESSOR   Take 50 mg by mouth 2 (two) times daily.      multivitamin tablet   Take 1 tablet by mouth daily.      pantoprazole 40 MG tablet   Commonly known as: PROTONIX   Take 1 tablet (40 mg total) by mouth daily at 12 noon. For one month then may stop.      potassium chloride 10 MEQ CR tablet   Commonly known as: KLOR-CON   Take 2 tablets (20 mEq total) by mouth as needed. For 5 days then stop.      traMADol 50 MG tablet   Commonly known as: Janean Sark  Take 1 tablet (50 mg total) by mouth every 6 (six) hours as needed for pain.            Follow Up Appointments: Follow-up Information    Follow up with VYAS,DHRUV B., MD. (Call for follow up regarding HGA1C 5.7)    Contact information:   [address] [place] [address] [phone]       Follow up with Nona Dell, MD. (Call for a follow up appointment for 2 weeks)    Contact information:   [address] [place] [address] [phone]       Follow up with Mikey Bussing, MD. (PA/LAT CXR to be taken on 04/08/2011  at 11:30 am;Appointment with Dr. Donata Clay is on 04/08/2011  at 12:30 pm)    Contact information:   [address] [place] [address] [phone]          Signed: Doree Fudge MPA-C 03/18/2011, 9:32 AM

## 2011-03-18 NOTE — Progress Notes (Signed)
CARDIAC REHAB PHASE I   PRE:  Rate/Rhythm: 83SR  BP:  Supine: 154/66  Sitting:   Standing:    SaO2: 95%2L  MODE:  Ambulation: 690 ft   POST:  Rate/Rhythem: 106ST  BP:  Supine:   Sitting: 176/51  Standing:    SaO2: 93%2L 0735-0800 Pt feeling better today. Walked 690 ft on 2L with rolling walker and asst x 2. Gait steady. Can be asst x 1. To recliner with call bell. Encouraged IS.   Duanne Limerick

## 2011-03-18 NOTE — Progress Notes (Addendum)
6 Days Post-Op Procedure(s) (LRB): AORTIC VALVE REPLACEMENT (AVR) (N/A) CORONARY ARTERY BYPASS GRAFTING (CABG) (N/A)  Subjective: Patient feeling better this am. He does not have any nausea, but still not much appetite.  Objective: Vital signs in last 24 hours: Patient Vitals for the past 24 hrs:  BP Temp Temp src Pulse Resp SpO2 Weight  03/18/11 0405 141/66 mmHg 98.3 F (36.8 C) Oral 75  18  96 % 183 lb 3.2 oz (83.1 kg)  03/17/11 2200 142/51 mmHg 98.7 F (37.1 C) Oral 77  18  97 % -  03/17/11 1420 149/64 mmHg 98.2 F (36.8 C) Oral 76  17  96 % -   Pre op weight  83.5 kg Current Weight  03/18/11 183 lb 3.2 oz (83.1 kg)      Intake/Output from previous day: 03/05 0701 - 03/06 0700 In: 363 [P.O.:360; I.V.:3] Out: 626 [Urine:625; Stool:1]   Physical Exam:  Cardiovascular: RRR;no murmurs, gallops, or rubs. Pulmonary:Diminshed at the bases; no rales, wheezes, or rhonchi. Abdomen: Soft, non tender, distended,occasional bowel sounds . Extremities: Mild bilateral lower extremity edema. Wounds: Clean and dry. No signs of infection.  Lab Results: CBC:  Basename 03/18/11 0550 03/16/11 0515  WBC 8.3 10.3  HGB 8.5* 8.7*  HCT 25.8* 26.1*  PLT 159 99*   BMET:   Basename 03/18/11 0550 03/16/11 0515  NA 131* 134*  K 3.9 3.7  CL 95* 95*  CO2 31 32  GLUCOSE 128* 119*  BUN 19 22  CREATININE 0.91 0.94  CALCIUM 8.4 8.5    PT/INR: No results found for this basename: LABPROT,INR in the last 72 hours ABG:  INR: Will add last result for INR, ABG once components are confirmed Will add last 4 CBG results once components are confirmed  Assessment/Plan:  1. CV - PAfib previously. SR this am.Previously Apaced so not on BB yet. HR now in 80's possibly start low dose BB soon.Continue Norvasc and Benazepril. 2.  Pulmonary - Encourage incentive spirometer and flutter valve.Wean O2. 3. Volume Overload - Continue with diuresis.Will give additional Lasix today. 4.  Acute blood loss  anemia - Last H/H  8.5/25.8.  5.Thrombocytopenia resolved-platelets increased from 99 to 159,000. 6.GI-.Reglan seems to be helping.[place] of abdomen and amylase were normal.LFTs are elevated.Will hold Lipitor for now. 7.Supplement potassium. 8.Remove EPW am.    Delora Gravatt MPA-C 03/18/2011

## 2011-03-18 NOTE — Progress Notes (Signed)
   CARE MANAGEMENT NOTE 03/18/2011  Patient:  Edward Pena, Edward Pena   Account Number:  [id number]  Date Initiated:  03/13/2011  Documentation initiated by:  [facility]  Subjective/Objective Assessment:   AVR and CABG x2 on 03-12-11.  Has spouse.     Action/Plan:   PTA, PT INDEPENDENT, LIVES WITH SPOUSE.   Anticipated DC Date:  03/17/2011   Anticipated DC Plan:  HOME W HOME HEALTH SERVICES      DC Planning Services  CM consult      Choice offered to / List presented to:             Status of service:  In process, will continue to follow Medicare Important Message given?   (If response is "NO", the following Medicare IM given date fields will be blank) Date Medicare IM given:   Date Additional Medicare IM given:    Discharge Disposition:    Per UR Regulation:  Reviewed for med. necessity/level of care/duration of stay  Comments:  03/18/11 Mayeli Bornhorst,RN,BSN 1118 PT WILL NEED HOME O2 AT DC.  PT WILL NEED DOCUMENTED ROOM AIR SATS <88% IN ORDER TO QUALIFY FOR HOME O2, PER MEDICARE GUIDELINES.  WILL FOLLOW UP WITH RN/CARDIAC REHAB. Phone #[phone]   03/16/11 Hisham Provence,RN,BSN 1145 MET WITH PT TO DISCUSS DC PLANS.  PT STATES SPOUSE WILL PROVIDE 24HR CARE AT DC.  HE HAS RW AT HOME, IF NEEDED AT DC.  WILL FOLLOW FOR HOME NEEDS AS PT PROGRESSES. Phone #[phone]

## 2011-03-18 NOTE — Discharge Instructions (Signed)
Activity: 1.May walk up steps                2.No lifting more than ten pounds for four weeks.                 3.No driving for four weeks.                4.Stop any activity that causes chest pain, shortness of breath, dizziness, sweating or excessive weakness.                5.Avoid straining.                6.Continue with your breathing exercises daily.  Diet: Diabetic diet and Low fat, Low salt diet  Wound Care: May shower.  Clean wounds with mild soap and water daily. Contact the office at [phone] if any problems arise. Coronary Artery Bypass Grafting Care After Refer to this sheet in the next few weeks. These instructions provide you with information on caring for yourself after your procedure. Your caregiver may also give you more specific instructions. Your treatment has been planned according to current medical practices, but problems sometimes occur. Call your caregiver if you have any problems or questions after your procedure.  Recovery from open heart surgery will be different for everyone. Some people feel well after 3 or 4 weeks, while for others it takes longer. After heart surgery, it may be normal to:  Not have an appetite, feel nauseated by the smell of food, or only want to eat a small amount.   Be constipated because of changes in your diet, activity, and medicines. Eat foods high in fiber. Add fresh fruits and vegetables to your diet. Stool softeners may be helpful.   Feel sad or unhappy. You may be frustrated or cranky. You may have good days and bad days. Do not give up. Talk to your caregiver if you do not feel better.   Feel weakness and fatigue. You many need physical therapy or cardiac rehabilitation to get your strength back.   Develop an irregular heartbeat called atrial fibrillation. Symptoms of atrial fibrillation are a fast, irregular heartbeat or feelings of fluttery heartbeats, shortness of breath, low blood pressure, and dizziness. If these symptoms  develop, see your caregiver right away.  MEDICATION  Have a list of all the medicines you will be taking when you leave the hospital. For every medicine, know the following:   Name.   Exact dose.   Time of day to be taken.   How often it should be taken.   Why you are taking it.   Ask which medicines should or should not be taken together. If you take more than one heart medicine, ask if it is okay to take them together. Some heart medicines should not be taken at the same time because they may lower your blood pressure too much.   Narcotic pain medicine can cause constipation. Eat fresh fruits and vegetables. Add fiber to your diet. Stool softener medicine may help relieve constipation.   Keep a copy of your medicines with you at all times.   Do not add or stop taking any medicine until you check with your caregiver.   Medicines can have side effects. Call your caregiver who prescribed the medicine if you:   Start throwing up, have diarrhea, or have stomach pain.   Feel dizzy or lightheaded when you stand up.   Feel your heart is skipping beats or is beating   too fast or too slow.   Develop a rash.   Notice unusual bruising or bleeding.  HOME CARE INSTRUCTIONS  After heart surgery, it is important to learn how to take your pulse. Have your caregiver show you how to take your pulse.   Use your incentive spirometer. Ask your caregiver how long after surgery you need to use it.  Care of your chest incision  Tell your caregiver right away if you notice clicking in your chest (sternum).   Support your chest with a pillow or your arms when you take deep breaths and cough.   Follow your caregiver's instructions about when you can bathe or swim.   Protect your incision from sunlight during the first year to keep the scar from getting dark.   Tell your caregiver if you notice:   Increased tenderness of your incision.   Increased redness or swelling around your incision.     Drainage or pus from your incision.  Care of your leg incision(s)  Avoid crossing your legs.   Avoid sitting for long periods of time. Change positions every half hour.   Elevate your leg(s) when you are sitting.   Check your leg(s) daily for swelling. Check the incisions for redness or drainage.   Wear your elastic stockings as told by your caregiver. Take them off at bedtime.  Diet  Diet is very important to heart health.   Eat plenty of fresh fruits and vegetables. Meats should be lean cut. Avoid canned, processed, and fried foods.   Talk to a dietician. They can teach you how to make healthy food and drink choices.  Weight  Weigh yourself every day. This is important because it helps to know if you are retaining fluid that may make your heart and lungs work harder.   Use the same scale each time.   Weigh yourself every morning at the same time. You should do this after you go to the bathroom, but before you eat breakfast.   Your weight will be more accurate if you do not wear any clothes.   Record your weight.   Tell your caregiver if you have gained 2 pounds or more overnight.  Activity Stop any activity at once if you have chest pain, shortness of breath, irregular heartbeats, or dizziness. Get help right away if you have any of these symptoms.  Bathing.  Avoid soaking in a bath or hot tub until your incisions are healed.   Rest. You need a balance of rest and activity.   Exercise. Exercise per your caregiver's advice. You may need physical therapy or cardiac rehabilitation to help strengthen your muscles and build your endurance.   Climbing stairs. Unless your caregiver tells you not to climb stairs, go up stairs slowly and rest if you tire. Do not pull yourself up by the handrail.   Driving a car. Follow your caregiver's advice on when you may drive. You may ride as a passenger at any time. When traveling for long periods of time in a car, get out of the car and  walk around for a few minutes every 2 hours.   Lifting. Avoid lifting, pushing, or pulling anything heavier than 10 pounds for 6 weeks after surgery or as told by your caregiver.   Returning to work. Check with your caregiver. People heal at different rates. Most people will be able to go back to work 6 to 12 weeks after surgery.   Sexual activity. You may resume sexual relations as   told by your caregiver.  SEEK MEDICAL CARE IF:  Any of your incisions are red, painful, or have any type of drainage coming from them.   You have an oral temperature above 102 F (38.9 C).   You have ankle or leg swelling.   You have pain in your legs.   You have weight gain of 2 or more pounds a day.   You feel dizzy or lightheaded when you stand up.  SEEK IMMEDIATE MEDICAL CARE IF:  You have angina or chest pain that goes to your jaw or arms. Call your local emergency services right away.   You have shortness of breath at rest or with activity.   You have a fast or irregular heartbeat (arrhythmia).   There is a "clicking" in your sternum when you move.   You have numbness or weakness in your arms or legs.  MAKE SURE YOU:  Understand these instructions.   Will watch your condition.   Will get help right away if you are not doing well or get worse.  Document Released: 07/18/2004 Document Revised: 12/18/2010 Document Reviewed: 03/05/2010 ExitCare Patient Information 2012 [employer].Aortic Valve Replacement Care After Read the instructions outlined below and refer to this sheet for the next few weeks. These discharge instructions provide you with general information on caring for yourself after you leave the hospital. Your surgeon may also give you specific instructions. While your treatment has been planned according to the most current medical practices available, unavoidable complications occasionally occur. If you have any problems or questions after discharge, please call your  surgeon. AFTER THE PROCEDURE  Full recovery from heart valve surgery can take several months.   Blood thinning (anticoagulation) treatment with warfarin is often prescribed for 6 weeks to 3 months after surgery for those with biological valves. It is prescribed for life for those with mechanical valves.   Recovery includes healing of the surgical incision. There is a gradual building of stamina and exercise abilities. An exercise program under the direction of a physical therapist may be recommended.   Once you have an artificial valve, your heart function and your life will return to normal. You usually feel better after surgery. Shortness of breath and fatigue should lessen. If your heart was already severely damaged before your surgery, you may continue to have problems.   You can usually resume most of your normal activities. You will have to continue to monitor your condition. You need to watch out for blood clots and infections.   Artificial valves need to be replaced after a period of time. It is important that you see your caregiver regularly.   Some individuals with an aortic valve replacement need to take antibiotics before having dental work or other surgical procedures. This is called prophylactic antibiotic treatment. These drugs help to prevent infective endocarditis. Antibiotics are only recommended for individuals with the highest risk for developing infective endocarditis. Let your dentist and your caregiver know if you have a history of any of the following so that the necessary precautions can be taken:   A VSD.   A repaired VSD.   Endocarditis in the past.   An artificial (prosthetic) heart valve.  HOME CARE INSTRUCTIONS   Use all medications as prescribed.   Take your temperature every morning for the first week after surgery. Record these.   Weigh yourself every morning for at least the first week after surgery and record.   Do not lift more than 10 pounds (4.5    kg) until your breastbone (sternum) has healed. Avoid all activities which would place strain on your incision.   You may shower as soon as directed by your caregiver after surgery. Pat incisions dry. Do not rub incisions with washcloth or towel.   Avoid driving for 4 to 6 weeks following surgery or as instructed.   Use your elastic stockings during the day. You should wear the stockings for at least 2 weeks after discharge or longer if your ankles are swollen. The stockings help blood flow and help reduce swelling in the legs. It is easiest to put the stockings on before you get out of bed in the morning. They should fit snugly.  Pain Control  If a prescription was given for a pain reliever, please follow your doctor's directions.   If the pain is not relieved by your medicine, becomes worse, or you have difficulty breathing, call your surgeon.  Activity  Take frequent rest periods throughout the day.   Wait one week before returning to strenuous activities such as heavy lifting (more than 10 pounds), pushing or pulling.   Talk with your doctor about when you may return to work and your exercise routine.   Do not drive while taking prescription pain medication.  Nutrition  You may resume your normal diet.   Drink plenty of fluids (6-8 glasses a day).   Eat a well-balanced diet.   Call your caregiver for persistent nausea or vomiting.  Elimination Your normal bowel function should return. If constipation should occur, you may:  Take a mild laxative.   Add fruit and bran to your diet.   Drink more fluids.   Call your doctor if constipation is not relieved.  SEEK IMMEDIATE MEDICAL CARE IF:   You develop chest pain which is not coming from your surgical cut (incision).   You develop shortness of breath or have difficulty breathing.   You develop a temperature over 101 F (38.3 C).   You have a sudden weight gain. Let your caregiver know what the weight gain is.   You  develop a rash.   You develop any reaction or side effects to medications given.   You have increased bleeding from wounds.   You see redness, swelling, or have increasing pain in wounds.   You have pus coming from your wound.   You develop lightheadedness or feel faint.  Document Released: 07/17/2004 Document Revised: 12/18/2010 Document Reviewed: 10/08/2004 Exit[employer] Patient Information 2012 [employer].    

## 2011-03-19 DIAGNOSIS — I4891 Unspecified atrial fibrillation: Secondary | ICD-10-CM | POA: Diagnosis not present

## 2011-03-19 NOTE — Progress Notes (Signed)
Subjective:  Stable post op.  Improving with rehab.  In and out of atrial fib this am.    Objective:  Vital Signs in the last 24 hours: Temp:  [98.2 F (36.8 C)-98.7 F (37.1 C)] 98.2 F (36.8 C) (03/07 0556) Pulse Rate:  [79-97] 84  (03/07 0556) Resp:  [18-20] 18  (03/07 0556) BP: (116-162)/(63-66) 140/64 mmHg (03/07 0556) SpO2:  [80 %-95 %] 92 % (03/07 0556)  Intake/Output from previous day: 03/06 0701 - 03/07 0700 In: 480 [P.O.:480] Out: 1025 [Urine:1025]   Physical Exam: General: Well developed, well nourished, in no acute distress. Head:  Normocephalic and atraumatic. Lungs: Clear to auscultation and percussion. Heart: irregularly, irregular with no def murmur. Pulses: Pulses normal in all 4 extremities. Extremities: No clubbing or cyanosis. No edema. Neurologic: Alert and oriented x 3.    Lab Results:  [facility] 03/18/11 0550  WBC 8.3  HGB 8.5*  PLT 159    Basename 03/18/11 0550  NA 131*  K 3.9  CL 95*  CO2 31  GLUCOSE 128*  BUN 19  CREATININE 0.91   No results found for this basename: TROPONINI:2,CK,MB:2 in the last 72 hours Hepatic Function Panel  Basename 03/17/11 1530  PROT 5.8*  ALBUMIN 2.4*  AST 149*  ALT 130*  ALKPHOS 198*  BILITOT 0.6  BILIDIR 0.2  IBILI 0.4   No results found for this basename: CHOL in the last 72 hours No results found for this basename: PROTIME in the last 72 hours  Imaging: US Abdomen Complete  03/17/2011  *RADIOLOGY REPORT*  Clinical Data:  Abdominal pain and right upper quadrant tenderness. Nausea.  COMPLETE ABDOMINAL ULTRASOUND  Comparison:  None.  Findings:  Gallbladder:  No gallstones, gallbladder wall thickening, or pericholecystic fluid. Negative sonographic Murphy's sign.  Common bile duct:  Normal.  4 mm in diameter.  Liver:  Normal.  IVC:  Normal.  Pancreas:  Obscured by bowel gas.  Spleen:  Normal.  5.7 cm in length.  Right Kidney:  Normal.  11.9 cm in length.  Left Kidney:  Normal.  12.1 cm in length.   Abdominal aorta:  Maximal diameter 2.2 cm.  Portions of the aorta are obscured by bowel gas.  IMPRESSION: Negative abdominal ultrasound. The pancreas is not visualized.  Original Report Authenticated By: Gwynn Burly, M.D.    Telemetry:  In atrial fib about 8 am.  Assessment/Plan:  SP AVR New onset atrial fib   Has modest anemia at this point and recent AVR.  In and out of atrial fib.  Will defer to PVT but lean toward low level anticoagulation if thought to be stable.        [name], MD, [facility], FSCAI 03/19/2011, 8:38 AM

## 2011-03-19 NOTE — Progress Notes (Signed)
CARDIAC REHAB PHASE I   PRE:  Rate/Rhythm: 82 SR  BP:  Supine:   Sitting: 142/50  Standing:    SaO2: 95 2L 91 RA  MODE:  Ambulation: 690 ft   POST:  Rate/Rhythem: 98 A fib  BP:  Supine:   Sitting: 144/50  Standing:    SaO2: 92-98 RA 1015-1055  O2 discontinued before walk RA sat 91%. Assisted X 1 and used walker to ambulate. Gait steady with walker. Did get tired and had to sit in hall in chair to rest. States that he started feeling weak and SOB. RA sats checked several times during walk, 92-98%. After sitting rest stop pt able to walk back to room. States  He does not feel as well today, some nausea.Back to recliner after walk with call light in reach.  Beatrix Fetters

## 2011-03-19 NOTE — Progress Notes (Signed)
Removed EPW x 3 and applied 2x2 and paper tape to each site for slight oozing.  Pt tolerated procedure well.  Remained on bedrest x one hour and frequent vitals were taken and documented.  Pt resting with call bell within reach at this time. Edward Pena

## 2011-03-19 NOTE — Progress Notes (Addendum)
7 Days Post-Op  Procedure(s) (LRB): AORTIC VALVE REPLACEMENT (AVR) (N/A) CORONARY ARTERY BYPASS GRAFTING (CABG) (N/A) Subjective: Feels poorly  Objective  Telemetry afib with CVR  Temp:  [98.2 F (36.8 C)-98.7 F (37.1 C)] 98.2 F (36.8 C) (03/07 0556) Pulse Rate:  [79-97] 84  (03/07 0556) Resp:  [18-20] 18  (03/07 0556) BP: (116-162)/(63-66) 140/64 mmHg (03/07 0556) SpO2:  [80 %-95 %] 95 % (03/07 0845)   Intake/Output Summary (Last 24 hours) at 03/19/11 0929 Last data filed at 03/18/11 2300  Gross per 24 hour  Intake    240 ml  Output    800 ml  Net   -560 ml       General appearance: alert, fatigued and no distress Heart: irregularly irregular rhythm Lungs: diminished in bases Abdomen: soft, non tender Extremities: + BLE edema Right> Left Wound: incisions healing well without signs of infection  Lab Results:  [facility] 03/18/11 0550  NA 131*  K 3.9  CL 95*  CO2 31  GLUCOSE 128*  BUN 19  CREATININE 0.91  CALCIUM 8.4  MG --  PHOS --    Basename 03/17/11 1530  AST 149*  ALT 130*  ALKPHOS 198*  BILITOT 0.6  PROT 5.8*  ALBUMIN 2.4*    Basename 03/17/11 1530  LIPASE --  AMYLASE 64    Basename 03/18/11 0550  WBC 8.3  NEUTROABS --  HGB 8.5*  HCT 25.8*  MCV 98.9  PLT 159   No results found for this basename: CKTOTAL:4,CKMB:4,TROPONINI:4 in the last 72 hours No components found with this basename: POCBNP:3 No results found for this basename: DDIMER in the last 72 hours No results found for this basename: HGBA1C in the last 72 hours No results found for this basename: CHOL,HDL,LDLCALC,TRIG,CHOLHDL in the last 72 hours No results found for this basename: TSH,T4TOTAL,FREET3,T3FREE,THYROIDAB in the last 72 hours No results found for this basename: VITAMINB12,FOLATE,FERRITIN,TIBC,IRON,RETICCTPCT in the last 72 hours  Medications: Scheduled    . amLODipine  10 mg Oral Daily  . aspirin EC  81 mg Oral Daily  . benazepril  20 mg Oral Daily  .  docusate sodium  200 mg Oral Daily  . folic acid  1 mg Oral Daily  . furosemide  40 mg Oral Daily  . furosemide  40 mg Oral Once  . levalbuterol  0.63 mg Nebulization BID  . metoCLOPramide  5 mg Oral TID WC & HS  . mulitivitamin with minerals  1 tablet Oral Daily  . pantoprazole  40 mg Oral Q1200  . potassium chloride  30 mEq Oral Once  . potassium chloride  30 mEq Oral Once  . potassium chloride  20 mEq Oral Daily  . povidone-iodine   Topical BID  . sodium chloride  3 mL Intravenous Q12H     Radiology/Studies:  US Abdomen Complete  03/17/2011  *RADIOLOGY REPORT*  Clinical Data:  Abdominal pain and right upper quadrant tenderness. Nausea.  COMPLETE ABDOMINAL ULTRASOUND  Comparison:  None.  Findings:  Gallbladder:  No gallstones, gallbladder wall thickening, or pericholecystic fluid. Negative sonographic Murphy's sign.  Common bile duct:  Normal.  4 mm in diameter.  Liver:  Normal.  IVC:  Normal.  Pancreas:  Obscured by bowel gas.  Spleen:  Normal.  5.7 cm in length.  Right Kidney:  Normal.  11.9 cm in length.  Left Kidney:  Normal.  12.1 cm in length.  Abdominal aorta:  Maximal diameter 2.2 cm.  Portions of the aorta are obscured by bowel gas.  IMPRESSION: Negative abdominal ultrasound. The pancreas is not visualized.  Original Report Authenticated By: Gwynn Burly, M.D.    INR: Will add last result for INR, ABG once components are confirmed Will add last 4 CBG results once components are confirmed  Assessment/Plan: S/P Procedure(s) (LRB): AORTIC VALVE REPLACEMENT (AVR) (N/A) CORONARY ARTERY BYPASS GRAFTING (CABG) (N/A) 1. Afib, not a good candidate for amiodarone with GI issues. Beta blocker not started yet as HR has been an issue. May be role for low level anticoagulation per cardiology. 2. Cont rehab/pulm toilet   LOS: 9 days    GOLD,WAYNE E 3/7/20139:29 AM   Pt seen and examined. Agree with above In SR right now, will defer anticoagulation decision to Dr. Morton Peters

## 2011-03-20 MED ORDER — METOPROLOL TARTRATE 12.5 MG HALF TABLET
12.5000 mg | ORAL_TABLET | Freq: Two times a day (BID) | ORAL | Status: DC
  Administered 2011-03-20 – 2011-03-25 (×10): 12.5 mg via ORAL
  Filled 2011-03-20 (×12): qty 1

## 2011-03-20 NOTE — Progress Notes (Signed)
UR completed Naomie Crow Crowder 03/20/2011 [phone] 

## 2011-03-20 NOTE — Progress Notes (Signed)
Physical Therapy Treatment Patient Details Name: Edward Pena MRN: [id number] DOB: [birth year] Today's Date: 03/20/2011  PT Assessment/Plan  PT - Assessment/Plan Comments on Treatment Session: Pt significantly limited this tx due to soreness in chest area, only able to walk ~50'. Educated pt on importance of keeping pain at low level, rather than trying to bring down once high. Pt continues to need cues for sternal precautions.  PT Plan: Discharge plan remains appropriate;Frequency remains appropriate PT Frequency: Min 3X/week Follow Up Recommendations: Home health PT;Supervision for mobility/OOB Equipment Recommended: None recommended by PT PT Goals  Acute Rehab PT Goals PT Goal: Sit to Stand - Progress: Progressing toward goal PT Goal: Stand to Sit - Progress: Progressing toward goal PT Goal: Stand - Progress: Progressing toward goal PT Goal: Ambulate - Progress: Progressing toward goal PT Goal: Perform Home Exercise Program - Progress: Progressing toward goal  PT Treatment Precautions/Restrictions  Precautions Precautions: Sternal;Fall Restrictions Weight Bearing Restrictions: No Mobility (including Balance) Bed Mobility Bed Mobility: No Transfers Sit to Stand: 4: Min assist;Without upper extremity assist;From chair/3-in-1 Sit to Stand Details (indicate cue type and reason): Cues for safe hand placement as pt attempts to use UE. Two attempts required Stand to Sit: 4: Min assist;Without upper extremity assist;To chair/3-in-1 Stand to Sit Details: Slow, guarded descent. Assist for steadying Ambulation/Gait Ambulation/Gait: Yes Ambulation/Gait Assistance: 5: Supervision Ambulation/Gait Assistance Details (indicate cue type and reason): Cues for posture as able and RW safety with turns Ambulation Distance (Feet): 50 Feet Assistive device: Rolling walker Gait Pattern: Step-through pattern;Decreased stride length;Shuffle;Trunk flexed Stairs: No    Exercise  Total Joint  Exercises Ankle Circles/Pumps: Seated;Both;Strengthening;AROM;15 reps General Exercises - Lower Extremity Long Arc Quad: AROM;Both;10 reps;Seated Hip Flexion/Marching: AROM;Both;10 reps;Seated End of Session PT - End of Session Equipment Utilized During Treatment: Gait belt Activity Tolerance: Patient limited by pain Patient left: in chair;with call bell in reach General Behavior During Session: [facility] for tasks performed Cognition: [facility] for tasks performed  Virl Cagey, PT [phone]  03/20/2011, 4:00 PM

## 2011-03-20 NOTE — Progress Notes (Addendum)
[address]            [place] [address]          [phone]     8 Days Post-Op  Procedure(s) (LRB): AORTIC VALVE REPLACEMENT (AVR) (N/A) CORONARY ARTERY BYPASS GRAFTING (CABG) (N/A) Subjective: Feels very weak  Objective  Telemetry afib with CVR  Temp:  [98.2 F (36.8 C)-98.8 F (37.1 C)] 98.8 F (37.1 C) (03/08 0356) Pulse Rate:  [84-90] 88  (03/08 0356) Resp:  [18] 18  (03/08 0356) BP: (124-168)/(67-80) 142/69 mmHg (03/08 0356) SpO2:  [87 %-95 %] 95 % (03/08 0756) Weight:  [195 lb (88.451 kg)] 195 lb (88.451 kg) (03/08 0500)   Intake/Output Summary (Last 24 hours) at 03/20/11 0912 Last data filed at 03/19/11 2100  Gross per 24 hour  Intake    360 ml  Output    425 ml  Net    -65 ml       General appearance: alert, fatigued and no distress Heart: irregularly irregular rhythm Lungs: mildly diminished in bases Abdomen: soft, nontender Extremities: + BLE edema Wound: incisions healing well  Lab Results:  Basename 03/18/11 0550  NA 131*  K 3.9  CL 95*  CO2 31  GLUCOSE 128*  BUN 19  CREATININE 0.91  CALCIUM 8.4  MG --  PHOS --    Basename 03/17/11 1530  AST 149*  ALT 130*  ALKPHOS 198*  BILITOT 0.6  PROT 5.8*  ALBUMIN 2.4*    Basename 03/17/11 1530  LIPASE --  AMYLASE 64    Basename 03/18/11 0550  WBC 8.3  NEUTROABS --  HGB 8.5*  HCT 25.8*  MCV 98.9  PLT 159   No results found for this basename: CKTOTAL:4,CKMB:4,TROPONINI:4 in the last 72 hours No components found with this basename: POCBNP:3 No results found for this basename: DDIMER in the last 72 hours No results found for this basename: HGBA1C in the last 72 hours No results found for this basename: CHOL,HDL,LDLCALC,TRIG,CHOLHDL in the last 72 hours No results found for this basename: TSH,T4TOTAL,FREET3,T3FREE,THYROIDAB in the last 72 hours No results found for this basename: VITAMINB12,FOLATE,FERRITIN,TIBC,IRON,RETICCTPCT in the last 72  hours  Medications: Scheduled    . amLODipine  10 mg Oral Daily  . aspirin EC  81 mg Oral Daily  . benazepril  20 mg Oral Daily  . docusate sodium  200 mg Oral Daily  . folic acid  1 mg Oral Daily  . furosemide  40 mg Oral Daily  . levalbuterol  0.63 mg Nebulization BID  . mulitivitamin with minerals  1 tablet Oral Daily  . pantoprazole  40 mg Oral Q1200  . potassium chloride  30 mEq Oral Once  . potassium chloride  20 mEq Oral Daily  . povidone-iodine   Topical BID  . sodium chloride  3 mL Intravenous Q12H     Radiology/Studies:  No results found.  INR: Will add last result for INR, ABG once components are confirmed Will add last 4 CBG results once components are confirmed  Assessment/Plan: S/P Procedure(s) (LRB): AORTIC VALVE REPLACEMENT (AVR) (N/A) CORONARY ARTERY BYPASS GRAFTING (CABG) (N/A)  1. Cont rehab as able, wean O2 off, push pulm toilet. 2. afib - still need to determine if ac rx is appropriate. ? Antiarrhythmic agent  LOS: 10 days    Edward Pena,Edward Pena 3/8/20139:12 AM    Start low dose metoprolol and hold  coumadin- ASA only

## 2011-03-20 NOTE — Progress Notes (Signed)
Offered to amb. Patient in hallway . Patient did not want to at this time.

## 2011-03-20 NOTE — Progress Notes (Signed)
Attemped to walk patient after going to bathroom and wash his face. DOE " I feel so weak" assist patient to chair will try again later. Patient believes he is 10lbs over his preop wt.

## 2011-03-20 NOTE — Progress Notes (Addendum)
CARDIAC REHAB PHASE I   PRE:  Rate/Rhythm: 89 Afib  BP:  Supine:   Sitting: 146/60  Standing:    SaO2: 90 1L  MODE:  Ambulation: 550 ft   POST:  Rate/Rhythem: 109 A fib  BP:  Supine:   Sitting: 160/50  Standing:    SaO2: 94 2L 1030-1105   On arrival pt is dyspenic with talking wheezing and has congested cough. O2 sat on 1L O2 90%. Assisted X 1 used O2 2L and walker to ambulate. Gait steady with walker tires easily and is DOE. Took many standing rest stops during walk. BP after walk 160/50. He c/o of weakness SOB and tiredness. Back to recliner after walk with call light in reach.  Beatrix Fetters

## 2011-03-21 ENCOUNTER — Inpatient Hospital Stay (HOSPITAL_COMMUNITY): Payer: Medicare Other

## 2011-03-21 LAB — COMPREHENSIVE METABOLIC PANEL
ALT: 207 U/L — ABNORMAL HIGH (ref 0–53)
AST: 128 U/L — ABNORMAL HIGH (ref 0–37)
Albumin: 2.6 g/dL — ABNORMAL LOW (ref 3.5–5.2)
Alkaline Phosphatase: 181 U/L — ABNORMAL HIGH (ref 39–117)
BUN: 23 mg/dL (ref 6–23)
CO2: 29 mEq/L (ref 19–32)
Calcium: 8.6 mg/dL (ref 8.4–10.5)
Chloride: 97 mEq/L (ref 96–112)
Creatinine, Ser: 0.92 mg/dL (ref 0.50–1.35)
GFR calc Af Amer: 88 mL/min — ABNORMAL LOW (ref >90)
GFR calc non Af Amer: 76 mL/min — ABNORMAL LOW (ref >90)
Glucose, Bld: 127 mg/dL — ABNORMAL HIGH (ref 70–99)
Potassium: 4.3 mEq/L (ref 3.5–5.1)
Sodium: 134 mEq/L — ABNORMAL LOW (ref 135–145)
Total Bilirubin: 0.6 mg/dL (ref 0.3–1.2)
Total Protein: 6.2 g/dL (ref 6.0–8.3)

## 2011-03-21 LAB — CBC
HCT: 25.9 % — ABNORMAL LOW (ref 39.0–52.0)
Hemoglobin: 8.5 g/dL — ABNORMAL LOW (ref 13.0–17.0)
MCH: 32.4 pg (ref 26.0–34.0)
MCHC: 32.8 g/dL (ref 30.0–36.0)
MCV: 98.9 fL (ref 78.0–100.0)
Platelets: 339 10*3/uL (ref 150–400)
RBC: 2.62 MIL/uL — ABNORMAL LOW (ref 4.22–5.81)
RDW: 13.9 % (ref 11.5–15.5)
WBC: 11.5 10*3/uL — ABNORMAL HIGH (ref 4.0–10.5)

## 2011-03-21 MED ORDER — BENAZEPRIL HCL 40 MG PO TABS
40.0000 mg | ORAL_TABLET | Freq: Every day | ORAL | Status: DC
  Administered 2011-03-21 – 2011-03-25 (×5): 40 mg via ORAL
  Filled 2011-03-21 (×5): qty 1

## 2011-03-21 NOTE — Progress Notes (Addendum)
[address]            [place] [address]          [phone]     9 Days Post-Op  Procedure(s) (LRB): AORTIC VALVE REPLACEMENT (AVR) (N/A) CORONARY ARTERY BYPASS GRAFTING (CABG) (N/A) Subjective: Feels much better today  Objective  Telemetry Afib with CVR  Temp:  [97.5 F (36.4 C)-99.5 F (37.5 C)] 97.5 F (36.4 C) (03/09 0500) Pulse Rate:  [84-102] 86  (03/09 0500) Resp:  [20-22] 22  (03/09 0500) BP: (136-188)/(62-76) 136/73 mmHg (03/09 0500) SpO2:  [90 %-91 %] 91 % (03/08 2132) Weight:  [193 lb 4.8 oz (87.68 kg)] 193 lb 4.8 oz (87.68 kg) (03/09 0500)   Intake/Output Summary (Last 24 hours) at 03/21/11 1034 Last data filed at 03/21/11 0900  Gross per 24 hour  Intake    480 ml  Output    975 ml  Net   -495 ml       General appearance: alert and no distress Heart: irregularly irregular rhythm Lungs: mildly diminished in the bases Abdomen: soft, mod distension, non-tender, + BS Extremities: edema continues to improve Wound: incisions healing well  Lab Results:  Basename 03/21/11 0625  NA 134*  K 4.3  CL 97  CO2 29  GLUCOSE 127*  BUN 23  CREATININE 0.92  CALCIUM 8.6  MG --  PHOS --    Basename 03/21/11 0625  AST 128*  ALT 207*  ALKPHOS 181*  BILITOT 0.6  PROT 6.2  ALBUMIN 2.6*   No results found for this basename: LIPASE:2,AMYLASE:2 in the last 72 hours  Basename 03/21/11 0625  WBC 11.5*  NEUTROABS --  HGB 8.5*  HCT 25.9*  MCV 98.9  PLT 339   No results found for this basename: CKTOTAL:4,CKMB:4,TROPONINI:4 in the last 72 hours No components found with this basename: POCBNP:3 No results found for this basename: DDIMER in the last 72 hours No results found for this basename: HGBA1C in the last 72 hours No results found for this basename: CHOL,HDL,LDLCALC,TRIG,CHOLHDL in the last 72 hours No results found for this basename: TSH,T4TOTAL,FREET3,T3FREE,THYROIDAB in the last 72 hours No results found for  this basename: VITAMINB12,FOLATE,FERRITIN,TIBC,IRON,RETICCTPCT in the last 72 hours  Medications: Scheduled    . amLODipine  10 mg Oral Daily  . aspirin EC  81 mg Oral Daily  . benazepril  20 mg Oral Daily  . docusate sodium  200 mg Oral Daily  . folic acid  1 mg Oral Daily  . furosemide  40 mg Oral Daily  . levalbuterol  0.63 mg Nebulization BID  . metoprolol tartrate  12.5 mg Oral BID  . mulitivitamin with minerals  1 tablet Oral Daily  . pantoprazole  40 mg Oral Q1200  . sodium chloride  3 mL Intravenous Q12H  . DISCONTD: potassium chloride  30 mEq Oral Once  . DISCONTD: potassium chloride  20 mEq Oral Daily  . DISCONTD: povidone-iodine   Topical BID     Radiology/Studies:  Dg Chest 2 View  03/21/2011  *RADIOLOGY REPORT*  Clinical Data: Status post aortic valve replacement.  CHEST - 2 VIEW  Comparison: Multiple priors, most recently 03/15/2011.  Findings: Lung volumes are normal.  There are persistent but decreasing bibasilar opacities, favored to represent areas of resolving postoperative subsegmental atelectasis, and decreasing small bilateral pleural effusions.  Some fluid is seen tracking within the left major  fissure.  Pulmonary vasculature is normal. No definite consolidative airspace disease.  Heart size is borderline enlarged (unchanged).  Postoperative changes of median sternotomy for CABG and aortic valve replacement (likely a stented bioprosthesis) are again noted.  Surgical clips at the level of the thoracic inlet likely from prior thyroid surgery. Atherosclerosis in the thoracic aorta.  IMPRESSION: 1.  Improving bibasilar aeration favored to represent resolving areas of postoperative atelectasis and decreasing small bilateral pleural effusions.  Otherwise, the radiographic appearance of the chest is similar to the recent prior study, as above.  Original Report Authenticated By: Florencia Reasons, M.D.    INR: Will add last result for INR, ABG once components are  confirmed Will add last 4 CBG results once components are confirmed  Assessment/Plan: S/P Procedure(s) (LRB): AORTIC VALVE REPLACEMENT (AVR) (N/A) CORONARY ARTERY BYPASS GRAFTING (CABG) (N/A)  1. Improved overall 2. afib - plans as outlined, beta blocker and aspirin  3. Wean O2 off, continue pulm toilet and cardiac rehab. 4. LFT's elevated, but relativily stable 5. HTN- increase benazepril  LOS: 11 days    GOLD,WAYNE E 3/9/201310:34 AM    I have seen and examined the patient and agree with the assessment and plan as outlined.  Tacha Manni H 03/21/2011 11:02 AM

## 2011-03-21 NOTE — Progress Notes (Signed)
Pt O2 stats 84 on 1 liter pt not having any signs of respiratory distress lying in the bed, color good oxygen increased to 3 l stats improved to 94 %. Ilean Skill LPN

## 2011-03-22 NOTE — Progress Notes (Addendum)
[address]            [place] [address]          [phone]     10 Days Post-Op  Procedure(s) (LRB): AORTIC VALVE REPLACEMENT (AVR) (N/A) CORONARY ARTERY BYPASS GRAFTING (CABG) (N/A) Subjective: Still has fair amount of DOE, sats ok on 3liters [place]  Objective  Telemetry AFIB-SR  Temp:  [98 F (36.7 C)-98.8 F (37.1 C)] 98 F (36.7 C) (03/10 0441) Pulse Rate:  [80-89] 80  (03/10 0441) Resp:  [20] 20  (03/10 0441) BP: (114-135)/(63-69) 128/63 mmHg (03/10 0441) SpO2:  [95 %-96 %] 95 % (03/10 0441) Weight:  [195 lb (88.451 kg)] 195 lb (88.451 kg) (03/10 0441)   Intake/Output Summary (Last 24 hours) at 03/22/11 0953 Last data filed at 03/21/11 1800  Gross per 24 hour  Intake    240 ml  Output    500 ml  Net   -260 ml       General appearance: alert, no distress and fairly weak Heart: regular rate and rhythm and S1, S2 normal Lungs: minimally diminished in bases Abdomen: soft, mild distnsion, non-tender Extremities: minor LE edema Wound: incisions healing well  Lab Results:  Basename 03/21/11 0625  NA 134*  K 4.3  CL 97  CO2 29  GLUCOSE 127*  BUN 23  CREATININE 0.92  CALCIUM 8.6  MG --  PHOS --    Basename 03/21/11 0625  AST 128*  ALT 207*  ALKPHOS 181*  BILITOT 0.6  PROT 6.2  ALBUMIN 2.6*   No results found for this basename: LIPASE:2,AMYLASE:2 in the last 72 hours  Basename 03/21/11 0625  WBC 11.5*  NEUTROABS --  HGB 8.5*  HCT 25.9*  MCV 98.9  PLT 339   No results found for this basename: CKTOTAL:4,CKMB:4,TROPONINI:4 in the last 72 hours No components found with this basename: POCBNP:3 No results found for this basename: DDIMER in the last 72 hours No results found for this basename: HGBA1C in the last 72 hours No results found for this basename: CHOL,HDL,LDLCALC,TRIG,CHOLHDL in the last 72 hours No results found for this basename: TSH,T4TOTAL,FREET3,T3FREE,THYROIDAB in the last 72 hours No results  found for this basename: VITAMINB12,FOLATE,FERRITIN,TIBC,IRON,RETICCTPCT in the last 72 hours  Medications: Scheduled    . amLODipine  10 mg Oral Daily  . aspirin EC  81 mg Oral Daily  . benazepril  40 mg Oral Daily  . docusate sodium  200 mg Oral Daily  . folic acid  1 mg Oral Daily  . furosemide  40 mg Oral Daily  . levalbuterol  0.63 mg Nebulization BID  . metoprolol tartrate  12.5 mg Oral BID  . mulitivitamin with minerals  1 tablet Oral Daily  . pantoprazole  40 mg Oral Q1200  . sodium chloride  3 mL Intravenous Q12H  . DISCONTD: benazepril  20 mg Oral Daily     Radiology/Studies:  Dg Chest 2 View  03/21/2011  *RADIOLOGY REPORT*  Clinical Data: Status post aortic valve replacement.  CHEST - 2 VIEW  Comparison: Multiple priors, most recently 03/15/2011.  Findings: Lung volumes are normal.  There are persistent but decreasing bibasilar opacities, favored to represent areas of resolving postoperative subsegmental atelectasis, and decreasing small bilateral pleural effusions.  Some fluid is seen tracking within the left major fissure.  Pulmonary vasculature is normal. No definite consolidative airspace disease.  Heart size is  borderline enlarged (unchanged).  Postoperative changes of median sternotomy for CABG and aortic valve replacement (likely a stented bioprosthesis) are again noted.  Surgical clips at the level of the thoracic inlet likely from prior thyroid surgery. Atherosclerosis in the thoracic aorta.  IMPRESSION: 1.  Improving bibasilar aeration favored to represent resolving areas of postoperative atelectasis and decreasing small bilateral pleural effusions.  Otherwise, the radiographic appearance of the chest is similar to the recent prior study, as above.  Original Report Authenticated By: Florencia Reasons, M.D.    INR: Will add last result for INR, ABG once components are confirmed Will add last 4 CBG results once components are confirmed  Assessment/Plan: S/P  Procedure(s) (LRB): AORTIC VALVE REPLACEMENT (AVR) (N/A) CORONARY ARTERY BYPASS GRAFTING (CABG) (N/A)  1. Still in and out of afib - on beta blocker/ asa, not a coumadin candidate.  2 wean O2- pulm toilet/rehab   LOS: 12 days    Edward Pena,Edward Pena 3/10/20139:53 AM    I have seen and examined the patient and agree with the assessment and plan as outlined.  Possibly ready for d/c 2-3 days.  Edward Pena H 03/22/2011 1:08 PM

## 2011-03-23 MED ORDER — POTASSIUM CHLORIDE CRYS ER 20 MEQ PO TBCR
20.0000 meq | EXTENDED_RELEASE_TABLET | Freq: Once | ORAL | Status: AC
  Administered 2011-03-23: 20 meq via ORAL
  Filled 2011-03-23: qty 1

## 2011-03-23 MED ORDER — METOLAZONE 5 MG PO TABS
5.0000 mg | ORAL_TABLET | Freq: Every day | ORAL | Status: AC
  Administered 2011-03-24: 5 mg via ORAL
  Filled 2011-03-23: qty 1

## 2011-03-23 MED ORDER — FUROSEMIDE 40 MG PO TABS
40.0000 mg | ORAL_TABLET | Freq: Once | ORAL | Status: AC
  Administered 2011-03-23: 40 mg via ORAL
  Filled 2011-03-23: qty 1

## 2011-03-23 NOTE — Progress Notes (Addendum)
11 Days Post-Op Procedure(s) (LRB): AORTIC VALVE REPLACEMENT (AVR) (N/A) CORONARY ARTERY BYPASS GRAFTING (CABG) (N/A)  Subjective: Patient still has some shortness of breath with ambulation.  Objective: Vital signs in last 24 hours: Patient Vitals for the past 24 hrs:  BP Temp Temp src Pulse Resp SpO2 Weight  03/23/11 0428 128/69 mmHg 97.5 F (36.4 C) Oral 84  18  93 % 195 lb (88.451 kg)  03/22/11 1947 131/58 mmHg 99.2 F (37.3 C) Oral 89  18  96 % -  03/22/11 1456 111/59 mmHg 98.3 F (36.8 C) Oral 79  20  97 % -   Pre op weight  83.5 kg Current Weight  03/23/11 195 lb (88.451 kg)      Intake/Output from previous day: 03/10 0701 - 03/11 0700 In: 480 [P.O.:480] Out: 125 [Urine:125]   Physical Exam:  Cardiovascular: RRR, no murmurs, gallops, or rubs. Pulmonary: Clear to auscultation bilaterally; no rales, wheezes, or rhonchi. Abdomen: Soft, non tender, bowel sounds present. Extremities: Mild bilateral lower extremity edema. Wounds: Clean and dry.  No erythema or signs of infection.  Lab Results: CBC: Basename 03/21/11 0625  WBC 11.5*  HGB 8.5*  HCT 25.9*  PLT 339   BMET:  Basename 03/21/11 0625  NA 134*  K 4.3  CL 97  CO2 29  GLUCOSE 127*  BUN 23  CREATININE 0.92  CALCIUM 8.6    PT/INR: No results found for this basename: LABPROT,INR in the last 72 hours ABG:  INR: Will add last result for INR, ABG once components are confirmed Will add last 4 CBG results once components are confirmed  Assessment/Plan:  1. CV -PAF.Remains SR this am.Continue Lopressor 12.5 bid,Norvasc 10 daily,Lotensin 40 daily.He is not a Coumadin candidate. 2.  Pulmonary - Encourage incentive spirometer and flutter valve. Still on 3 L O2 via [place]. 3. Volume Overload - Give additional dose of lasix today. 4.  Acute blood loss anemia - Last H/H 8.5/25.9 5.Thrombocytopenia-Last platelet count up to 134,000.  ZIMMERMAN,DONIELLE MPA-C 03/23/2011   With tissue aVR and very high fall  risk and advanced age will hold on Coumadin and use aspirin 81 mg. Patient wishes to go home with home health support and will need better pulmonary status prior to discharge.

## 2011-03-23 NOTE — Progress Notes (Signed)
Physical Therapy Treatment Patient Details Name: Edward Pena MRN: [id number] DOB: 1926/08/26 Today's Date: 03/23/2011  PT Assessment/Plan  PT - Assessment/Plan Comments on Treatment Session: Progressing today. Educated pt on monitoring his exertion level and taking rest breaks as needed as pt tends to ambulate very quickly while walking and get SOB.  PT Plan: Discharge plan remains appropriate;Frequency remains appropriate Follow Up Recommendations: Home health PT;Supervision for mobility/OOB Equipment Recommended: None recommended by PT PT Goals  Acute Rehab PT Goals PT Goal: Sit to Stand - Progress: Progressing toward goal PT Goal: Stand to Sit - Progress: Progressing toward goal PT Goal: Stand - Progress: Progressing toward goal PT Goal: Ambulate - Progress: Progressing toward goal PT Goal: Perform Home Exercise Program - Progress: Progressing toward goal  PT Treatment Precautions/Restrictions  Precautions Precautions: Sternal;Fall Restrictions Weight Bearing Restrictions: No Mobility (including Balance) Transfers Sit to Stand: Without upper extremity assist;From chair/3-in-1 Sit to Stand Details (indicate cue type and reason): mingaurdA; cues for hand placement Stand to Sit: 5: Supervision Stand to Sit Details: cues for safe hand placement  Ambulation/Gait Ambulation/Gait Assistance: 5: Supervision Ambulation/Gait Assistance Details (indicate cue type and reason): amb with RW; cues for self monitoring and rest breaks as pt became SOB; SpO2 tends to drop every 20 ft to 86-87% but pt able to recover with breathing cues; better posture with amb today, still unsteady during turns Ambulation Distance (Feet): 250 Feet Assistive device: Rolling walker    Exercise  General Exercises - Lower Extremity Gluteal Sets: AROM;Both;10 reps;Seated Long Arc Quad: AROM;Both;20 reps;Seated Hip Flexion/Marching: AROM;Both;10 reps;Standing Toe Raises: AROM;Both;20 reps;Seated Heel Raises:  AROM;Both;15 reps;Seated End of Session PT - End of Session Equipment Utilized During Treatment: Gait belt Activity Tolerance: Patient tolerated treatment well Patient left: in chair;with call bell in reach General Behavior During Session: [facility] for tasks performed Cognition: [facility] for tasks performed  [facility] HELEN 03/23/2011, 3:52 PM

## 2011-03-23 NOTE — Progress Notes (Signed)
   SUBJECTIVE:  Breathing OK.  Denies pain.  Increased lower extremity edema.   PHYSICAL EXAM Filed Vitals:   03/22/11 0441 03/22/11 1456 03/22/11 1947 03/23/11 0428  BP: 128/63 111/59 131/58 128/69  Pulse: 80 79 89 84  Temp: 98 F (36.7 C) 98.3 F (36.8 C) 99.2 F (37.3 C) 97.5 F (36.4 C)  TempSrc: Oral Oral Oral Oral  Resp: 20 20 18 18   Height:      Weight: 88.451 kg (195 lb)   88.451 kg (195 lb)  SpO2: 95% 97% 96% 93%   General:  No distress Lungs:  Few dependent crackles Heart:  RRR Abdomen:  Positive bowel sounds, no rebound no guarding Extremities:  Moderate edema to the thighs.   Intake/Output Summary (Last 24 hours) at 03/23/11 1445 Last data filed at 03/22/11 2049  Gross per 24 hour  Intake      0 ml  Output    125 ml  Net   -125 ml    ASSESSMENT AND PLAN:   1)  CAD, NATIVE VESSEL/ Aortic stenosis:  Status post CABG/AVR.  Possible discharge in 1 - 2 days per TCTS   2)  Atrial fibrillation:  In and out.   The rate is slow and he does not notice this.  Not thought to be good anticoagulation candidate per surgery.    Rollene Rotunda 03/23/2011 2:45 PM

## 2011-03-23 NOTE — Progress Notes (Signed)
Patient ambulated 328ft on 3LO2 using a rolling walker with RN.  Patient walked at a slow, steady pace.  Patient tolerated ambulation well.  Patient returned to bed and resting. Will continue to monitor.

## 2011-03-23 NOTE — Progress Notes (Signed)
CARDIAC REHAB PHASE I   PRE:  Rate/Rhythm: 91 SR  BP:  Supine:   Sitting: 110/50  Standing:    SaO2: 93 1L  MODE:  Ambulation: 700 ft   POST:  Rate/Rhythem: 105  BP:  Supine:   Sitting: 148/56  Standing:    SaO2: 88-93 RA [id number] Assisted X 1 and used walker to ambulate. Gait steady with walker. Pt c/o pf SOB and get tired. He took several standing rest stops, but able to increase distance to 700 ft. RA sat during and after walk 88-93 %. O2 left off after walk, encouraged use of IS. Back to recliner after walk with call light in reach. Beatrix Fetters

## 2011-03-23 NOTE — Discharge Summary (Signed)
Agree with discharge summary above

## 2011-03-24 MED ORDER — POTASSIUM CHLORIDE CRYS ER 20 MEQ PO TBCR
20.0000 meq | EXTENDED_RELEASE_TABLET | Freq: Once | ORAL | Status: AC
  Administered 2011-03-24: 20 meq via ORAL
  Filled 2011-03-24: qty 1

## 2011-03-24 MED ORDER — LEVALBUTEROL HCL 0.63 MG/3ML IN NEBU
0.6300 mg | INHALATION_SOLUTION | Freq: Four times a day (QID) | RESPIRATORY_TRACT | Status: DC | PRN
  Filled 2011-03-24: qty 3

## 2011-03-24 MED ORDER — LACTULOSE 10 GM/15ML PO SOLN
20.0000 g | Freq: Once | ORAL | Status: AC
  Administered 2011-03-24: 20 g via ORAL
  Filled 2011-03-24: qty 30

## 2011-03-24 MED ORDER — FUROSEMIDE 40 MG PO TABS
40.0000 mg | ORAL_TABLET | Freq: Once | ORAL | Status: AC
  Administered 2011-03-24: 40 mg via ORAL
  Filled 2011-03-24: qty 1

## 2011-03-24 NOTE — Progress Notes (Signed)
Chest tube sutures removed. Steri strips applied and benzoin applied on top. Also cleansed pt's incisions with betadine

## 2011-03-24 NOTE — Progress Notes (Signed)
CARDIAC REHAB PHASE I   PRE:  Rate/Rhythm: 92SR  BP:  Supine: 150/60  Sitting:   Standing:    SaO2: 94%2L  MODE:  Ambulation: 850 ft   POST:  Rate/Rhythem: 111  BP:  Supine:   Sitting: 160/50  Standing:    SaO2: monitored whole walk---89% once briefly. Otherwise 90-94%RA. Left off oxygen. 1035-1110 Pt walked 850 ft on RA with rolling walker and asst x 1. Monitored sats the whole walk. Did well on RA. Took a few standing rest breaks. Tolerated well. Left off oxygen. To sitting on side of bed awaiting meds.  Duanne Limerick

## 2011-03-24 NOTE — Progress Notes (Signed)
   SUBJECTIVE:  Breathing OK.  Denies pain.  Decreased lower extremity edema.   PHYSICAL EXAM Filed Vitals:   03/23/11 2116 03/24/11 0457 03/24/11 0805 03/24/11 1410  BP: 128/66 145/75  118/66  Pulse: 91 88  88  Temp: 98.9 F (37.2 C) 98.5 F (36.9 C)  98.8 F (37.1 C)  TempSrc: Oral Oral  Oral  Resp: 19 18  18   Height:      Weight:  88.451 kg (195 lb)    SpO2: 93% 95% 93% 90%   General:  No distress Lungs:  Few dependent crackles Heart:  RRR Abdomen:  Positive bowel sounds, no rebound no guarding Extremities:  Mild edema to the thighs.   Intake/Output Summary (Last 24 hours) at 03/24/11 1741 Last data filed at 03/24/11 0900  Gross per 24 hour  Intake      0 ml  Output    775 ml  Net   -775 ml    ASSESSMENT AND PLAN:   1)  CAD, NATIVE VESSEL/ Aortic stenosis:  Status post CABG/AVR.  Continues to recover slowly    2)  Atrial fibrillation:  I came today prepared to start him on warfarin because of his paroxysmal afib.  I discussed his case with Dr. Donata Clay and the patient has no absolute contraindications to warfarin.  However, over the past 35 hours he has had no further atrial fibrillation so I will hold off on starting it.    Rollene Rotunda 03/24/2011 5:41 PM

## 2011-03-24 NOTE — Progress Notes (Signed)
UR Completed.  Garwood Wentzell Jane [phone] 03/24/2011  

## 2011-03-24 NOTE — Progress Notes (Signed)
12 Days Post-Op Procedure(s) (LRB): AORTIC VALVE REPLACEMENT (AVR) (N/A) CORONARY ARTERY BYPASS GRAFTING (CABG) (N/A)  Subjective: Patient is feeling better this am. Has complaints of constipation.  Objective: Vital signs in last 24 hours: Patient Vitals for the past 24 hrs:  BP Temp Temp src Pulse Resp SpO2 Weight  03/24/11 0457 145/75 mmHg 98.5 F (36.9 C) Oral 88  18  95 % 195 lb (88.451 kg)  03/23/11 2116 128/66 mmHg 98.9 F (37.2 C) Oral 91  19  93 % -  03/23/11 2024 - - - - - 90 % -  03/23/11 1400 138/60 mmHg 98.7 F (37.1 C) Oral 88  18  91 % -  03/23/11 1349 - - - - - 90 % -   Pre op weight  83.5 kg Current Weight  03/24/11 195 lb (88.451 kg)      Intake/Output from previous day: 03/11 0701 - 03/12 0700 In: -  Out: 476 [Urine:475; Stool:1]   Physical Exam:  Cardiovascular: RRR, no murmurs, gallops, or rubs. Pulmonary: Diminished at bases; no rales, wheezes, or rhonchi. Abdomen: Soft, non tender, bowel sounds present. Extremities: Mild bilateral lower extremity edema. Wounds: Clean and dry.  No erythema or signs of infection.  Lab Results: CBC:No results found for this basename: WBC:2,HGB:2,HCT:2,PLT:2 in the last 72 hours BMET: No results found for this basename: NA:2,K:2,CL:2,CO2:2,GLUCOSE:2,BUN:2,CREATININE:2,CALCIUM:2 in the last 72 hours  PT/INR: No results found for this basename: LABPROT,INR in the last 72 hours ABG:  INR: Will add last result for INR, ABG once components are confirmed Will add last 4 CBG results once components are confirmed  Assessment/Plan:  1. CV -PAF.Remains SR this am.Continue Lopressor 12.5 bid,Norvasc 10 daily,Lotensin 40 daily.He is not a Coumadin candidate. 2.  Pulmonary - Encourage incentive spirometer and flutter valve. Still on 3 L O2 via [place]. 3. Volume Overload - Give additional dose of lasix today. 4.  Acute blood loss anemia - Last H/H 8.5/25.9 5.Thrombocytopenia-Last platelet count up to 134,000. 6.Remove CT  sutures. 7.LOC constipation. 8.Will discharge when pulmonary status is stable.  Jesus Nevills MPA-C 03/24/2011

## 2011-03-25 ENCOUNTER — Encounter: Payer: Self-pay | Admitting: Cardiothoracic Surgery

## 2011-03-25 LAB — BASIC METABOLIC PANEL
CO2: 28 mEq/L (ref 19–32)
Chloride: 90 mEq/L — ABNORMAL LOW (ref 96–112)
GFR calc Af Amer: 89 mL/min — ABNORMAL LOW (ref >90)
Potassium: 3.6 mEq/L (ref 3.5–5.1)

## 2011-03-25 MED ORDER — POTASSIUM CHLORIDE CRYS ER 20 MEQ PO TBCR: 20.0000 meq | EXTENDED_RELEASE_TABLET | Freq: Every day | ORAL | Status: DC

## 2011-03-25 MED ORDER — POTASSIUM CHLORIDE 20 MEQ/15ML (10%) PO LIQD
20.0000 meq | Freq: Every day | ORAL | Status: DC
  Filled 2011-03-25: qty 15

## 2011-03-25 MED ORDER — BENAZEPRIL HCL 40 MG PO TABS: 40.0000 mg | ORAL_TABLET | Freq: Every day | ORAL | Status: DC

## 2011-03-25 MED ORDER — FUROSEMIDE 40 MG PO TABS: 40.0000 mg | ORAL_TABLET | Freq: Every day | ORAL | Status: DC

## 2011-03-25 MED ORDER — AMLODIPINE BESY-BENAZEPRIL HCL 10-40 MG PO CAPS: 1.0000 | ORAL_CAPSULE | Freq: Every day | ORAL | Status: DC

## 2011-03-25 MED ORDER — POTASSIUM CHLORIDE CRYS ER 20 MEQ PO TBCR
40.0000 meq | EXTENDED_RELEASE_TABLET | Freq: Once | ORAL | Status: AC
  Administered 2011-03-25: 40 meq via ORAL
  Filled 2011-03-25: qty 2

## 2011-03-25 MED ORDER — METOPROLOL TARTRATE 25 MG PO TABS: 12.5000 mg | ORAL_TABLET | Freq: Two times a day (BID) | ORAL | Status: DC

## 2011-03-25 NOTE — Progress Notes (Addendum)
13 Days Post-Op Procedure(s) (LRB): AORTIC VALVE REPLACEMENT (AVR) (N/A) CORONARY ARTERY BYPASS GRAFTING (CABG) (N/A)  Subjective: Patient feels well and would like to go home.  Objective: Vital signs in last 24 hours: Patient Vitals for the past 24 hrs:  BP Temp Temp src Pulse Resp SpO2 Weight  03/25/11 0526 169/73 mmHg 98.3 F (36.8 C) Oral 99  20  90 % 185 lb 3 oz (84 kg)  03/24/11 2003 136/61 mmHg 97.6 F (36.4 C) Oral 93  18  91 % -  03/24/11 1410 118/66 mmHg 98.8 F (37.1 C) Oral 88  18  90 % -   Pre op weight  83.5 kg Current Weight  03/25/11 185 lb 3 oz (84 kg)      Intake/Output from previous day: 03/12 0701 - 03/13 0700 In: -  Out: 875 [Urine:875]   Physical Exam:  Cardiovascular: RRR, no murmurs, gallops, or rubs. Pulmonary: Diminished at bases; no rales, wheezes, or rhonchi. Abdomen: Soft, non tender, bowel sounds present. Extremities: Mild bilateral lower extremity edema. Wounds: Clean and dry.  No erythema or signs of infection.  Lab Results: CBC:No results found for this basename: WBC:2,HGB:2,HCT:2,PLT:2 in the last 72 hours BMET:   [facility] 03/25/11 0545  NA 129*  K 3.6  CL 90*  CO2 28  GLUCOSE 126*  BUN 14  CREATININE 0.90  CALCIUM 8.7    PT/INR: No results found for this basename: LABPROT,INR in the last 72 hours ABG:  INR: Will add last result for INR, ABG once components are confirmed Will add last 4 CBG results once components are confirmed  Assessment/Plan:  1. CV -PAF.Remains SR this am.Continue Lopressor 12.5 bid,Norvasc 10 daily,Lotensin 40 daily.He is not a Coumadin candidate. 2.  Pulmonary - Encourage incentive spirometer and flutter valve. Assess if will require O2 upon discharge. 3. Volume Overload - Continue Lasix. 4.  Acute blood loss anemia - Last H/H 8.5/25.9 5.Thrombocytopenia-Last platelet count up to 134,000. 6.Supplement Potassium. 7.Possibly discharge today.  ZIMMERMAN,DONIELLE MPA-C 03/25/2011  Patient  remains in sinus rhythm doing well off oxygen and ambulating in the hallway with good saturation. We'll discharge home today

## 2011-03-25 NOTE — Progress Notes (Signed)
PT Cancellation Note  Treatment cancelled today due to pt ambulating with cardiac rehab and plan for d/c home today. Pt d/c'd prior to me getting back to check with patient about home safety. Pt to f/u with HHPT.   WHITLOW,Milka Windholz HELEN 03/25/2011, 1:03 PM

## 2011-03-25 NOTE — Progress Notes (Signed)
   CARE MANAGEMENT NOTE 03/25/2011  Patient:  Edward Pena, Edward Pena   Account Number:  [id number]  Date Initiated:  03/13/2011  Documentation initiated by:  [facility] ([facility]  Subjective/Objective Assessment:   AVR and CABG x2 on 03-12-11.  Has spouse.     Action/Plan:   PTA, PT INDEPENDENT, LIVES WITH SPOUSE.   Anticipated DC Date:  03/17/2011   Anticipated DC Plan:  HOME W HOME HEALTH SERVICES      DC Planning Services  CM consult      Choice offered to / List presented to:     DME arranged  [facility]      DME agency  [facility]        Status of service:  Completed, signed off Medicare Important Message given?   (If response is "NO", the following Medicare IM given date fields will be blank) Date Medicare IM given:   Date Additional Medicare IM given:    Discharge Disposition:  HOME/SELF CARE  Per UR Regulation:  Reviewed for med. necessity/level of care/duration of stay  If discussed at Long Length of Stay Meetings, dates discussed:   03/25/2011    Comments:  03/25/11 [name],RN,BSN 1100 HOME OXYGEN REFERRAL TO [facility]; CONTINUES TO DROP SATS WITH AMBULATION.  PT FOR DISCHARGE HOME TODAY WITH SPOUSE. PORTABLE TANK TO BE DELIVERED TO ROOM PRIOR TO DC. Phone #[phone]   03/18/11 [name],RN,BSN 1118 PT WILL NEED HOME O2 AT DC.  PT WILL NEED DOCUMENTED ROOM AIR SATS <88% IN ORDER TO QUALIFY FOR HOME O2, PER MEDICARE GUIDELINES.  WILL FOLLOW UP WITH RN/CARDIAC REHAB. Phone #[phone]  03/16/11 [name],RN,BSN 1145 MET WITH PT TO DISCUSS DC PLANS.  PT STATES SPOUSE WILL PROVIDE 24HR CARE AT DC.  HE HAS RW AT HOME, IF NEEDED AT DC.  WILL FOLLOW FOR HOME NEEDS AS PT PROGRESSES. Phone #[phone]

## 2011-03-25 NOTE — Progress Notes (Signed)
CARDIAC REHAB PHASE I   PRE:  Rate/Rhythm: 91SR  BP:  Supine:   Sitting: 130/70  Standing:    SaO2: 92%RA  MODE:  Ambulation: 800 ft   POST:  Rate/Rhythem: 106ST  BP:  Supine:   Sitting: 128/64  Standing:    SaO2:  Got to 86-87%RA with 300 ft. Put on oxygen 2L to walk another 500 ft. sats above 90% 0935-1000 Pt desat on RA today when walking. Still recovers very quickly with rest. Put on 2L to walk and sats above 90%. Pt stated that oxygen did not seem to affect his breathing. He felt the same with or without.  Left oxygen off in room. Will return to ed when family here.  Duanne Limerick

## 2011-03-25 NOTE — Progress Notes (Signed)
Cardiac Rehab [phone] Education completed with pt and wife. Permission to refer to [facility] Phase 2.  Pt knows to wear oxygen when up walking. Meily Glowacki DunlapRN

## 2011-03-25 NOTE — Discharge Summary (Signed)
Addendum Note to D/C Edward BISCHOF is a 78 y.o. male who is S/P Procedure(s): AORTIC VALVE REPLACEMENT (AVR) and CORONARY ARTERY BYPASS GRAFTING (CABG). He continued to require 2-3 Liters of O2 via [place]. He was fairly weak and volume overloaded as well. Over the last couple of days, however, he was diuresed aggressively and his stamina has improved.He has remained in SR for over 36 hours. Dr. Antoine Poche and Dr. Donata Clay discussed the initiation of Coumadin. It was decided that as long as he remains in SR he does not need Coumadin. If he has any afib, it will be started as he is at high risk for thromboembolic events.He ambulated yesterday without O2 and his O2 sat has remained 90-94% (desat only once with O2 sat 89%).During ambulation today on room air, he did desat to 86-87%. He was placed on 2L of O2 via [place] and his O2 sat was well into the 90's. Home oxygen will be arranged.The patient is stable and ready for discharge.  No changes to History and Physical Exam.  Please note that the following changes were made to his medications:  Lotrel is now 10/40 MG po daily Lasix and Potassium (same dosages) are for 4 days only then stop Lopressor is 12.5 mg po two times daily Digoxin 0.125 mg po daily  Zocor has been stopped since 03/17/2011 because he had elevated LFTs. This will be resumed as an outpatient.  Ardelle Balls PA-C 03/25/2011 9:14 AM

## 2011-03-25 NOTE — Progress Notes (Signed)
Pt discharge instructions and patient education complete. IV site d/c. Site WNL. No s/s of distress. Home O2 set up with patient. D/C home via wheelchair with home. Dion Saucier

## 2011-04-06 NOTE — Discharge Summary (Signed)
patient examined and medical record reviewed,agree with above note. VAN TRIGT III,Andreu Drudge 04/06/2011    

## 2011-04-08 ENCOUNTER — Ambulatory Visit: Payer: BC Managed Care – PPO | Admitting: Cardiothoracic Surgery

## 2011-04-08 ENCOUNTER — Other Ambulatory Visit: Payer: Self-pay | Admitting: Cardiothoracic Surgery

## 2011-04-08 DIAGNOSIS — I2510 Atherosclerotic heart disease of native coronary artery without angina pectoris: Secondary | ICD-10-CM

## 2011-04-13 ENCOUNTER — Encounter: Payer: Self-pay | Admitting: Cardiology

## 2011-04-13 ENCOUNTER — Telehealth: Payer: Self-pay | Admitting: *Deleted

## 2011-04-13 ENCOUNTER — Ambulatory Visit (INDEPENDENT_AMBULATORY_CARE_PROVIDER_SITE_OTHER): Payer: Medicare Other | Admitting: Cardiology

## 2011-04-13 VITALS — BP 129/74 | HR 109 | Ht 70.0 in | Wt 175.4 lb

## 2011-04-13 DIAGNOSIS — I498 Other specified cardiac arrhythmias: Secondary | ICD-10-CM

## 2011-04-13 DIAGNOSIS — R000 Tachycardia, unspecified: Secondary | ICD-10-CM

## 2011-04-13 DIAGNOSIS — I4891 Unspecified atrial fibrillation: Secondary | ICD-10-CM

## 2011-04-13 DIAGNOSIS — I359 Nonrheumatic aortic valve disorder, unspecified: Secondary | ICD-10-CM

## 2011-04-13 DIAGNOSIS — I350 Nonrheumatic aortic (valve) stenosis: Secondary | ICD-10-CM

## 2011-04-13 DIAGNOSIS — I2510 Atherosclerotic heart disease of native coronary artery without angina pectoris: Secondary | ICD-10-CM

## 2011-04-13 DIAGNOSIS — I10 Essential (primary) hypertension: Secondary | ICD-10-CM

## 2011-04-13 DIAGNOSIS — E785 Hyperlipidemia, unspecified: Secondary | ICD-10-CM

## 2011-04-13 MED ORDER — METOPROLOL TARTRATE 25 MG PO TABS: 25.0000 mg | ORAL_TABLET | Freq: Three times a day (TID) | ORAL | Status: DC

## 2011-04-13 NOTE — Telephone Encounter (Signed)
Pt left message on RN's voicemail regarding metoprolol dosing as requested at time of office visit.  Spoke with pt who confirms he has metoprolol 50 mg. Pt has been taking 1/4 tablet three times a day.   Pt is aware to increase metoprolol to 25 mg three times a day.

## 2011-04-13 NOTE — Assessment & Plan Note (Signed)
Postoperative, in sinus rhythm now. Increase Lopressor to 25 mg 3 times a day, continue aspirin for now.

## 2011-04-13 NOTE — Progress Notes (Signed)
Clinical Summary Edward Pena is a 78 y.o.male presenting for followup. He was seen in February. He was subsequently referred for cardiac catheterization and surgical evaluation for progressive, symptomatically aortic stenosis. He underwent aortic valve replacement with a 21 mm pericardial Magna Ease valve in association with LIMA to LAD and SVG to first obtuse marginal with Dr. Donata Clay on February 28. Postoperative course was notable for atrial fibrillation although this was transient.  He states that he has been recuperating well so far, gaining energy steadily. Appetite has been stable. He reports no palpitations. Not using any analgesics for postsurgical pain. He is due to see Dr. Donata Clay on Wednesday.  Followup ECG today shows sinus tachycardia with nonspecific ST-T wave changes.   No Known Allergies  Current Outpatient Prescriptions  Medication Sig Dispense Refill  . amLODipine-benazepril (LOTREL) 10-40 MG per capsule Take 1 capsule by mouth daily.  30 capsule  1  . aspirin 81 MG tablet Take 81 mg by mouth daily.        . folic acid (FOLVITE) 1 MG tablet Take 1 tablet (1 mg total) by mouth daily. For one month then stop.  30 tablet  0  . metoprolol tartrate (LOPRESSOR) 25 MG tablet Take 12.5 mg by mouth 3 (three) times daily.      . Multiple Vitamin (MULTIVITAMIN) tablet Take 1 tablet by mouth daily.        . pantoprazole (PROTONIX) 40 MG tablet Take 1 tablet (40 mg total) by mouth daily at 12 noon. For one month then may stop.  30 tablet  0  . DISCONTD: metoprolol (LOPRESSOR) 25 MG tablet Take 0.5 tablets (12.5 mg total) by mouth 2 (two) times daily.  60 tablet  1  . nitroGLYCERIN (NITROSTAT) 0.4 MG SL tablet Place 0.4 mg under the tongue every 5 (five) minutes as needed.      [name] DISCONTD: benazepril (LOTENSIN) 40 MG tablet Take 1 tablet (40 mg total) by mouth daily.  30 tablet  1  . DISCONTD: potassium chloride (KLOR-CON) 10 MEQ CR tablet Take 2 tablets (20 mEq total) by mouth as  needed. For 5 days then stop.  10 tablet  0    Past Medical History  Diagnosis Date  . Coronary atherosclerosis of native coronary artery     DES LAD 10/07  . Carotid artery disease     Mild  . Essential hypertension, benign   . Mixed hyperlipidemia   . Aortic stenosis   . Aortic regurgitation   . Type 2 diabetes mellitus   . Peptic ulcer disease   . Blood transfusion   . GERD (gastroesophageal reflux disease)     Past Surgical History  Procedure Date  . Appendectomy   . Aortic valve replacement 03/12/2011    Procedure: AORTIC VALVE REPLACEMENT (AVR);  Surgeon: Kathlee Nations Suann Larry, MD;  Location: [facility] OR;  Service: Open Heart Surgery;  Laterality: N/A;  Aortic Valve Replacement   . Coronary artery bypass graft 03/12/2011    Procedure: CORONARY ARTERY BYPASS GRAFTING (CABG);  Surgeon: Kathlee Nations Suann Larry, MD;  Location: [facility] OR;  Service: Open Heart Surgery;  Laterality: N/A;  Coronary Artery Bypass Graft on pump times  two utilizing left internal mammary artery and right greater saphenous vein, harvested endoscopically.  Transesophageal echocardiogram     Family History  Problem Relation Age of Onset  . Hypertension      Social History Mr. Celmer reports that he has never smoked. He has never used  smokeless tobacco. Mr. Sagar reports that he drinks alcohol.  Review of Systems No fevers or chills. No orthopnea or PND. No significant peripheral edema. Otherwise negative except as outlined.  Physical Examination Filed Vitals:   04/13/11 1030  BP: 129/74  Pulse: 109   Overweight male in no acute distress.  HEENT: Conjunctiva and lids normal, oropharynx is clear.  Neck: Supple, soft carotid bruits as noted previously.  Lungs: Clear without labored breathing, decreased at the bases. Thorax: Well healing midline sternal incision, no drainage or erythema. Cardiac: Regular rate and rhythm, very soft systolic murmur at the right base. No S3 gallop. Abdomen: Soft, nontender, bowel  sounds present. Healing keyhole incisions. Extremities: Trace edema, distal pulses 2+.  Skin: Warm and dry.  Musculoskeletal: No gross deformities.  Neuropsychiatric: Alert and oriented x3, affect appropriate.    Problem List and Plan

## 2011-04-13 NOTE — Patient Instructions (Addendum)
Follow up in 1 month. Call the office when you get home to verify dose of metoprolol. You will be notified then as to how to take this medication.  Bring your medications with you to your next appointment.

## 2011-04-13 NOTE — Assessment & Plan Note (Signed)
Status post recent LIMA to LAD and SVG to first obtuse marginal. Continue medical therapy and observation.

## 2011-04-13 NOTE — Assessment & Plan Note (Signed)
Status post bioprosthetic aortic valve replacement as outlined. Patient recuperating well. He is to followup with Dr. Donata Clay this Wednesday.

## 2011-04-13 NOTE — Assessment & Plan Note (Signed)
Blood pressure control is reasonable today. 

## 2011-04-13 NOTE — Assessment & Plan Note (Signed)
Stopped during recent hospitalization with some GI symptoms. Will follow along and potentially resume as he recovers further.

## 2011-04-15 ENCOUNTER — Encounter: Payer: Self-pay | Admitting: Cardiothoracic Surgery

## 2011-04-15 ENCOUNTER — Ambulatory Visit: Payer: Medicare Other | Admitting: Cardiothoracic Surgery

## 2011-04-15 ENCOUNTER — Ambulatory Visit (INDEPENDENT_AMBULATORY_CARE_PROVIDER_SITE_OTHER): Payer: Self-pay | Admitting: Cardiothoracic Surgery

## 2011-04-15 ENCOUNTER — Ambulatory Visit
Admission: RE | Admit: 2011-04-15 | Discharge: 2011-04-15 | Disposition: A | Discharge status: Home / Self Care | Payer: Medicare Other | Source: Ambulatory Visit | Attending: Cardiothoracic Surgery | Admitting: Cardiothoracic Surgery

## 2011-04-15 VITALS — BP 143/83 | HR 96 | Resp 18 | Ht 70.0 in | Wt 173.0 lb

## 2011-04-15 DIAGNOSIS — I2510 Atherosclerotic heart disease of native coronary artery without angina pectoris: Secondary | ICD-10-CM

## 2011-04-15 DIAGNOSIS — Z952 Presence of prosthetic heart valve: Secondary | ICD-10-CM

## 2011-04-15 DIAGNOSIS — I359 Nonrheumatic aortic valve disorder, unspecified: Secondary | ICD-10-CM

## 2011-04-15 DIAGNOSIS — Z954 Presence of other heart-valve replacement: Secondary | ICD-10-CM

## 2011-04-15 DIAGNOSIS — Z951 Presence of aortocoronary bypass graft: Secondary | ICD-10-CM

## 2011-04-15 NOTE — Patient Instructions (Signed)
The patient may drive The patient may start cardiac rehabilitation at [facility]

## 2011-04-15 NOTE — Progress Notes (Signed)
PCP is VYAS,DHRUV B., MD, MD Referring Provider is Herby Abraham, MD  Chief Complaint  Patient presents with  . Routine Post Op    5 week f/u from surgery with CXR, S/P AVR and CABG x 2       HPI:                      [address]            Edward Pena [address]          [phone]     The patient returns for his first postop visit after undergoing aortic valve replacement with combined coronary bypass grafting February 28. He had a tissue valve placed. Postoperatively the patient had problems with low oxygen saturation and was discharged home on home oxygen. He had some transient atrial fibrillation but suddenly settled in a sinus rhythm it was not sent home on Coumadin. Since returning home his breathing is remarkably improved and he is now off oxygen with saturation of 95% on room air. His incisions healing well and his overall strength is improving. Is ready to start outpatient cardiac rehabilitation. He has no symptoms of chest pain or shortness of breath.   Past Medical History  Diagnosis Date  . Coronary atherosclerosis of native coronary artery     DES LAD 10/07  . Carotid artery disease     Mild  . Essential hypertension, benign   . Mixed hyperlipidemia   . Aortic stenosis   . Aortic regurgitation   . Type 2 diabetes mellitus   . Peptic ulcer disease   . Blood transfusion   . GERD (gastroesophageal reflux disease)     Past Surgical History  Procedure Date  . Appendectomy   . Aortic valve replacement 03/12/2011    Procedure: AORTIC VALVE REPLACEMENT (AVR);  Surgeon: Kathlee Nations Suann Larry, MD;  Location: [facility] OR;  Service: Open Heart Surgery;  Laterality: N/A;  Aortic Valve Replacement   . Coronary artery bypass graft 03/12/2011    Procedure: CORONARY ARTERY BYPASS GRAFTING (CABG);  Surgeon: Kathlee Nations Suann Larry, MD;  Location: [facility] OR;  Service: Open Heart Surgery;  Laterality: N/A;  Coronary Artery Bypass Graft on pump times  two utilizing left internal  mammary artery and right greater saphenous vein, harvested endoscopically.  Transesophageal echocardiogram     Family History  Problem Relation Age of Onset  . Hypertension      Social History History  Substance Use Topics  . Smoking status: Never Smoker   . Smokeless tobacco: Never Used  . Alcohol Use: Yes     Occasional    Current Outpatient Prescriptions  Medication Sig Dispense Refill  . amLODipine-benazepril (LOTREL) 10-40 MG per capsule Take 1 capsule by mouth daily.  30 capsule  1  . aspirin 81 MG tablet Take 81 mg by mouth daily.        . folic acid (FOLVITE) 1 MG tablet Take 1 tablet (1 mg total) by mouth daily. For one month then stop.  30 tablet  0  . metoprolol tartrate (LOPRESSOR) 25 MG tablet Take 1 tablet (25 mg total) by mouth 3 (three) times daily.  90 tablet  6  . Multiple Vitamin (MULTIVITAMIN) tablet Take 1 tablet by mouth daily.        . nitroGLYCERIN (NITROSTAT) 0.4 MG SL tablet Place 0.4 mg under the tongue every 5 (five) minutes as needed.      . pantoprazole (PROTONIX) 40  MG tablet Take 1 tablet (40 mg total) by mouth daily at 12 noon. For one month then may stop.  30 tablet  0  . DISCONTD: benazepril (LOTENSIN) 40 MG tablet Take 1 tablet (40 mg total) by mouth daily.  30 tablet  1  . DISCONTD: potassium chloride (KLOR-CON) 10 MEQ CR tablet Take 2 tablets (20 mEq total) by mouth as needed. For 5 days then stop.  10 tablet  0    No Known Allergies  Review of Systems appetite improved, he has lost approximately 10 pounds in weight total. No problems with the surgical incision, no incisional pain  BP 143/83  Pulse 96  Resp 18  Ht 5\' 10"  (1.778 m)  Wt 173 lb (78.472 kg)  BMI 24.82 kg/m2  SpO2 98% Physical Exam General alert and comfortable Lungs clear to auscultation Cardiac rhythm regular without murmur Sternal incision well-healed Leg incision well-healed no pedal edema  Diagnostic Tests: Chest x-ray with significant improvement in lung volumes  since discharge no pleural effusions sternal wires intact  Impression: Stable course one month after aVR CABG.  Plan: Continue his current medications. He knows he can resume driving in light activity. Last the [facility] hospital rehabilitation program to contact the patient to start cardiac rehabilitation. He return here as needed

## 2011-04-21 ENCOUNTER — Telehealth: Payer: Self-pay

## 2011-04-21 NOTE — Telephone Encounter (Signed)
Spoke to wife informed that [facility]  notifed to pick up oxygen . Oxygen d/c .

## 2011-05-15 ENCOUNTER — Ambulatory Visit (INDEPENDENT_AMBULATORY_CARE_PROVIDER_SITE_OTHER): Payer: Medicare Other | Admitting: Cardiology

## 2011-05-15 ENCOUNTER — Encounter: Payer: Self-pay | Admitting: Cardiology

## 2011-05-15 VITALS — BP 125/73 | HR 77 | Ht 69.0 in | Wt 176.0 lb

## 2011-05-15 DIAGNOSIS — E785 Hyperlipidemia, unspecified: Secondary | ICD-10-CM

## 2011-05-15 DIAGNOSIS — I2510 Atherosclerotic heart disease of native coronary artery without angina pectoris: Secondary | ICD-10-CM

## 2011-05-15 DIAGNOSIS — I350 Nonrheumatic aortic (valve) stenosis: Secondary | ICD-10-CM

## 2011-05-15 DIAGNOSIS — I4891 Unspecified atrial fibrillation: Secondary | ICD-10-CM

## 2011-05-15 DIAGNOSIS — I10 Essential (primary) hypertension: Secondary | ICD-10-CM

## 2011-05-15 DIAGNOSIS — I359 Nonrheumatic aortic valve disorder, unspecified: Secondary | ICD-10-CM

## 2011-05-15 NOTE — Patient Instructions (Signed)
Your physician wants you to follow-up in: 3 months. You will receive a reminder letter in the mail one-two months in advance. If you don't receive a letter, please call our office to schedule the follow-up appointment. Your physician recommends that you continue on your current medications as directed. Please refer to the Current Medication list given to you today. Your physician recommends that you go to the [facility] for a FASTING lipid profile and liver function labs. Do not eat or drink after midnight. DO IN 3 MONTHS JUST BEFORE YOUR OFFICE VISIT.

## 2011-05-15 NOTE — Assessment & Plan Note (Signed)
Symptomatically stable status post CABG. Continue medical therapy and cardiac rehabilitation. Followup arranged.

## 2011-05-15 NOTE — Assessment & Plan Note (Signed)
Postoperative, remains in sinus rhythm.

## 2011-05-15 NOTE — Assessment & Plan Note (Signed)
Status post bioprosthetic AVR. 

## 2011-05-15 NOTE — Assessment & Plan Note (Signed)
Blood pressure well controlled

## 2011-05-15 NOTE — Progress Notes (Signed)
Clinical Summary Edward Pena is a 78 y.o.male presenting for followup. He was seen back in April following aortic valve replacement with associated CABG and has followed with Dr. Donata Clay since then.  He is doing well, now one week into cardiac rehabilitation. He reports gaining strength, no unusual shortness of breath or chest soreness. Reports compliance with his medications.  We discussed lifting restrictions. He states that he has had occasional palpitations but nothing prolonged.   No Known Allergies  Current Outpatient Prescriptions  Medication Sig Dispense Refill  . amLODipine-benazepril (LOTREL) 10-40 MG per capsule Take 1 capsule by mouth daily.  30 capsule  1  . aspirin 81 MG tablet Take 81 mg by mouth daily.        . folic acid (FOLVITE) 1 MG tablet Take 1 tablet (1 mg total) by mouth daily. For one month then stop.  30 tablet  0  . metoprolol tartrate (LOPRESSOR) 25 MG tablet Take 1 tablet (25 mg total) by mouth 3 (three) times daily.  90 tablet  6  . Multiple Vitamin (MULTIVITAMIN) tablet Take 1 tablet by mouth daily.        . nitroGLYCERIN (NITROSTAT) 0.4 MG SL tablet Place 0.4 mg under the tongue every 5 (five) minutes as needed.      . pantoprazole (PROTONIX) 40 MG tablet Take 1 tablet (40 mg total) by mouth daily at 12 noon. For one month then may stop.  30 tablet  0  . DISCONTD: benazepril (LOTENSIN) 40 MG tablet Take 1 tablet (40 mg total) by mouth daily.  30 tablet  1  . DISCONTD: potassium chloride (KLOR-CON) 10 MEQ CR tablet Take 2 tablets (20 mEq total) by mouth as needed. For 5 days then stop.  10 tablet  0    Past Medical History  Diagnosis Date  . Coronary atherosclerosis of native coronary artery     DES LAD 10/07  . Carotid artery disease     Mild  . Essential hypertension, benign   . Mixed hyperlipidemia   . Aortic stenosis   . Aortic regurgitation   . Type 2 diabetes mellitus   . Peptic ulcer disease   . Blood transfusion   . GERD (gastroesophageal  reflux disease)     Past Surgical History  Procedure Date  . Appendectomy   . Aortic valve replacement 03/12/2011    Procedure: AORTIC VALVE REPLACEMENT (AVR);  Surgeon: Kathlee Nations Suann Larry, MD;  Location: [facility] OR;  Service: Open Heart Surgery;  Laterality: N/A;  Aortic Valve Replacement   . Coronary artery bypass graft 03/12/2011    Procedure: CORONARY ARTERY BYPASS GRAFTING (CABG);  Surgeon: Kathlee Nations Suann Larry, MD;  Location: [facility] OR;  Service: Open Heart Surgery;  Laterality: N/A;  Coronary Artery Bypass Graft on pump times  two utilizing left internal mammary artery and right greater saphenous vein, harvested endoscopically.  Transesophageal echocardiogram     Social History Mr. Mintzer reports that he has never smoked. He has never used smokeless tobacco. Mr. Thibeau reports that he drinks alcohol.  Review of Systems Negative except as outlined.  Physical Examination Filed Vitals:   05/15/11 1429  BP: 125/73  Pulse: 77    Overweight male in no acute distress.  HEENT: Conjunctiva and lids normal, oropharynx is clear.  Neck: Supple, soft carotid bruits as noted previously.  Lungs: Clear without labored breathing, decreased at the bases.  Thorax: Well healing midline sternal incision, no drainage or erythema.  Cardiac: Regular rate and  rhythm, very soft systolic murmur at the right base. No S3 gallop.  Abdomen: Soft, nontender, bowel sounds present. Healing keyhole incisions.  Extremities: Trace edema, distal pulses 2+.    ECG Reviewed in EMR.   Problem List and Plan

## 2011-05-15 NOTE — Assessment & Plan Note (Signed)
Recheck FLP and LFT for next visit. Can discuss resumption of statin therapy around that time.

## 2011-06-11 ENCOUNTER — Telehealth: Payer: Self-pay | Admitting: Cardiology

## 2011-06-11 NOTE — Telephone Encounter (Signed)
Left message for patient to call office.  

## 2011-06-11 NOTE — Telephone Encounter (Signed)
He was doing well when I saw him in early May. He should be able to proceed with cataract surgery at this time.

## 2011-06-11 NOTE — Telephone Encounter (Signed)
Edward Pena saw Dr. Lita Mains today for possible surgery. States he needs to have cataract surgery. Dr. Lita Mains told Mr. Grudzien To check with Dr. Diona Browner to see if it is ok for him to proceed with this surgery.

## 2011-06-16 NOTE — Telephone Encounter (Signed)
Patient informed. 

## 2011-08-11 ENCOUNTER — Telehealth: Payer: Self-pay | Admitting: *Deleted

## 2011-08-11 DIAGNOSIS — E785 Hyperlipidemia, unspecified: Secondary | ICD-10-CM

## 2011-08-11 DIAGNOSIS — Z79899 Other long term (current) drug therapy: Secondary | ICD-10-CM

## 2011-08-11 NOTE — Telephone Encounter (Signed)
Message copied by Eustace Moore on Tue Aug 11, 2011 10:21 AM ------      Message from: Arlyss Gandy      Created: Fri May 15, 2011  2:53 PM      Regarding: DUE FOR FLP/LFT       DUE FOR FLP/LFT BEFORE 3 MO F/U APPT

## 2011-08-11 NOTE — Telephone Encounter (Signed)
Patient informed that fasting labs are due. Patient said he would go on 08/17/11. Order faxed to [facility].

## 2011-08-21 ENCOUNTER — Ambulatory Visit (INDEPENDENT_AMBULATORY_CARE_PROVIDER_SITE_OTHER): Payer: Medicare Other | Admitting: Cardiology

## 2011-08-21 ENCOUNTER — Encounter: Payer: Self-pay | Admitting: Cardiology

## 2011-08-21 VITALS — BP 143/70 | HR 64 | Ht 70.0 in | Wt 174.8 lb

## 2011-08-21 DIAGNOSIS — I2510 Atherosclerotic heart disease of native coronary artery without angina pectoris: Secondary | ICD-10-CM

## 2011-08-21 MED ORDER — ATORVASTATIN CALCIUM 20 MG PO TABS: 20.0000 mg | ORAL_TABLET | Freq: Every day | ORAL | Status: DC

## 2011-08-21 NOTE — Progress Notes (Signed)
   Clinical Summary [name] is a 78 y.o.male presenting for followup. He was seen in May. He has completed cardiac rehabilitation, continues to exercise in the maintenance program. Has done very well, recuperating nicely. No limiting shortness of breath or chest pain.  Recent followup lab work showed AST 21, ALT 18, triglycerides 134, cholesterol 122, HDL 59, LDL 136. We discussed these numbers today and possible treatment options.  No Known Allergies  Current Outpatient Prescriptions  Medication Sig Dispense Refill  . amLODipine-benazepril (LOTREL) 10-40 MG per capsule Take 1 capsule by mouth daily.  30 capsule  1  . aspirin 81 MG tablet Take 81 mg by mouth daily.        [name] atorvastatin (LIPITOR) 20 MG tablet Take 20 mg by mouth daily.      . metoprolol tartrate (LOPRESSOR) 25 MG tablet Take 1 tablet (25 mg total) by mouth 3 (three) times daily.  90 tablet  6  . Multiple Vitamin (MULTIVITAMIN) tablet Take 1 tablet by mouth daily.        . nitroGLYCERIN (NITROSTAT) 0.4 MG SL tablet Place 0.4 mg under the tongue every 5 (five) minutes as needed.      [name] DISCONTD: benazepril (LOTENSIN) 40 MG tablet Take 1 tablet (40 mg total) by mouth daily.  30 tablet  1  . DISCONTD: potassium chloride (KLOR-CON) 10 MEQ CR tablet Take 2 tablets (20 mEq total) by mouth as needed. For 5 days then stop.  10 tablet  0    Past Medical History  Diagnosis Date  . Coronary atherosclerosis of native coronary artery     DES LAD 10/07  . Carotid artery disease     Mild  . Essential hypertension, benign   . Mixed hyperlipidemia   . Aortic stenosis   . Aortic regurgitation   . Type 2 diabetes mellitus   . Peptic ulcer disease   . Blood transfusion   . GERD (gastroesophageal reflux disease)     Social History [name] reports that he has never smoked. He has never used smokeless tobacco. [name] reports that he drinks alcohol.  Review of Systems No palpitations, no bleeding problems. Stable appetite.  Otherwise negative.  Physical Examination Filed Vitals:   08/21/11 0912  BP: 143/70  Pulse: 64    Overweight male in no acute distress.  HEENT: Conjunctiva and lids normal, oropharynx is clear.  Neck: Supple, soft carotid bruits as noted previously.  Lungs: Clear without labored breathing, decreased at the bases.  Thorax: Well healing midline sternal incision, no drainage or erythema.  Cardiac: Regular rate and rhythm, very soft systolic murmur at the right base. No S3 gallop.  Abdomen: Soft, nontender, bowel sounds present. Healing keyhole incisions.  Extremities: Trace edema, distal pulses 2+.     Problem List and Plan   CAD, NATIVE VESSEL No active angina. Continue medical therapy, exercise regimen, observation.  Aortic stenosis Status post bioprosthetic AVR.  HYPERLIPIDEMIA-MIXED Initiate Lipitor 10 mg p.o. Q.h.s. Followup FLP and LFT for next visit.    Jonelle Sidle, M.D., F.A.C.C.

## 2011-08-21 NOTE — Patient Instructions (Addendum)
   Your physician recommends that you schedule a follow-up appointment in: 6 months with Dr. Diona Browner. You will receive a reminder letter in the mail in about 4 months reminding you to call and schedule your appointment. If you don't receive this letter, please contact our office.  Your physician recommends that you continue on your current medications as directed in addition to starting atorvastatin 20mg  daily at bedtime. Your new prescription has been sent to your pharmacy. Please refer to the Current Medication list given to you today.   Your physician recommends that you return for a FASTING lipid/liver profile: in 6 months just before your next visit at [facility]. You will receive a reminder letter in the mail in about 4 months reminding you to have this blood work done. If you don't receive this letter, please contact our office.

## 2011-08-21 NOTE — Assessment & Plan Note (Signed)
No active angina. Continue medical therapy, exercise regimen, observation.

## 2011-08-21 NOTE — Assessment & Plan Note (Signed)
Initiate Lipitor 10 mg p.o. Q.h.s. Followup FLP and LFT for next visit.

## 2011-08-21 NOTE — Assessment & Plan Note (Signed)
Status post bioprosthetic AVR. 

## 2012-03-08 ENCOUNTER — Encounter: Payer: Self-pay | Admitting: Cardiology

## 2012-03-08 ENCOUNTER — Ambulatory Visit (INDEPENDENT_AMBULATORY_CARE_PROVIDER_SITE_OTHER): Payer: Medicare Other | Admitting: Cardiology

## 2012-03-08 VITALS — BP 164/72 | HR 68 | Ht 70.0 in | Wt 183.8 lb

## 2012-03-08 NOTE — Assessment & Plan Note (Signed)
Continues on Lipitor 20 mg daily at this point. Will request most recent lipid panel from Dr. Sherril Croon.

## 2012-03-08 NOTE — Progress Notes (Signed)
Clinical Summary Edward Pena is an 79 y.o.male presenting for followup. He was seen in August 2013. He continues to do very well. He exercises regularly in the cardiac rehabilitation maintenance program with his wife. Usually there for at least an hour. No limiting symptoms.  He has had assessment of lipids with Dr. Sherril Croon since our last visit. ECG today shows sinus rhythm with prolonged PR interval.  No Known Allergies  Current Outpatient Prescriptions  Medication Sig Dispense Refill  . amLODipine-benazepril (LOTREL) 10-40 MG per capsule Take 1 capsule by mouth daily.  30 capsule  1  . atorvastatin (LIPITOR) 20 MG tablet Take 1 tablet (20 mg total) by mouth daily.  30 tablet  6  . metoprolol tartrate (LOPRESSOR) 25 MG tablet Take 1 tablet (25 mg total) by mouth 3 (three) times daily.  90 tablet  6  . Multiple Vitamin (MULTIVITAMIN) tablet Take 1 tablet by mouth daily.        . nitroGLYCERIN (NITROSTAT) 0.4 MG SL tablet Place 0.4 mg under the tongue every 5 (five) minutes as needed.      . pantoprazole (PROTONIX) 40 MG tablet Take 40 mg by mouth daily.       . [DISCONTINUED] benazepril (LOTENSIN) 40 MG tablet Take 1 tablet (40 mg total) by mouth daily.  30 tablet  1  . [DISCONTINUED] potassium chloride (KLOR-CON) 10 MEQ CR tablet Take 2 tablets (20 mEq total) by mouth as needed. For 5 days then stop.  10 tablet  0   No current facility-administered medications for this visit.    Past Medical History  Diagnosis Date  . Coronary atherosclerosis of native coronary artery     DES LAD 10/07  . Carotid artery disease     Mild  . Essential hypertension, benign   . Mixed hyperlipidemia   . Aortic stenosis   . Aortic regurgitation   . Type 2 diabetes mellitus   . Peptic ulcer disease   . Blood transfusion   . GERD (gastroesophageal reflux disease)     Past Surgical History  Procedure Laterality Date  . Appendectomy    . Aortic valve replacement  03/12/2011    Procedure: AORTIC VALVE  REPLACEMENT (AVR);  Surgeon: Kathlee Nations Suann Larry, MD;  Location: [facility] OR;  Service: Open Heart Surgery;  Laterality: N/A;  Aortic Valve Replacement   . Coronary artery bypass graft  03/12/2011    Procedure: CORONARY ARTERY BYPASS GRAFTING (CABG);  Surgeon: Kathlee Nations Suann Larry, MD;  Location: [facility] OR;  Service: Open Heart Surgery;  Laterality: N/A;  Coronary Artery Bypass Graft on pump times  two utilizing left internal mammary artery and right greater saphenous vein, harvested endoscopically.  Transesophageal echocardiogram     Social History Edward Pena reports that he has never smoked. He has never used smokeless tobacco. Edward Pena reports that  drinks alcohol.  Review of Systems Stable appetite. No recent bleeding episodes. Was taken off aspirin previously. No palpitations. No syncope. Otherwise negative.  Physical Examination Filed Vitals:   03/08/12 1056  BP: 164/72  Pulse: 68   Filed Weights   03/08/12 1056  Weight: 183 lb 12.8 oz (83.371 kg)    No acute distress.  HEENT: Conjunctiva and lids normal, oropharynx is clear.  Neck: Supple, soft carotid bruits as noted previously.  Lungs: Clear without labored breathing, decreased at the bases.  Thorax: Well healing midline sternal incision, no drainage or erythema.  Cardiac: Regular rate and rhythm, very soft systolic murmur at the  right base. No S3 gallop.  Abdomen: Soft, nontender, bowel sounds present. Healing keyhole incisions.  Extremities: Trace edema, distal pulses 2+.    Problem List and Plan   CAD, NATIVE VESSEL Multivessel status post CABG in February of last year, doing well on medical therapy. Continue regular exercise plan.  Aortic stenosis Status post bioprosthetic AVR.  HYPERLIPIDEMIA-MIXED Continues on Lipitor 20 mg daily at this point. Will request most recent lipid panel from Dr. Sherril Croon.    Jonelle Sidle, M.D., F.A.C.C.

## 2012-03-08 NOTE — Patient Instructions (Addendum)

## 2012-03-08 NOTE — Assessment & Plan Note (Signed)
Multivessel status post CABG in February of last year, doing well on medical therapy. Continue regular exercise plan.

## 2012-03-08 NOTE — Assessment & Plan Note (Signed)
Status post bioprosthetic AVR. 

## 2012-04-22 ENCOUNTER — Other Ambulatory Visit: Payer: Self-pay | Admitting: Cardiology

## 2012-04-22 MED ORDER — METOPROLOL TARTRATE 25 MG PO TABS: 25.0000 mg | ORAL_TABLET | Freq: Three times a day (TID) | ORAL | Status: DC

## 2012-04-25 ENCOUNTER — Other Ambulatory Visit: Payer: Self-pay | Admitting: Cardiology

## 2012-04-25 MED ORDER — METOPROLOL TARTRATE 25 MG PO TABS: 25.0000 mg | ORAL_TABLET | Freq: Three times a day (TID) | ORAL | Status: DC

## 2012-09-05 ENCOUNTER — Ambulatory Visit (INDEPENDENT_AMBULATORY_CARE_PROVIDER_SITE_OTHER): Payer: Medicare Other | Admitting: Cardiology

## 2012-09-05 ENCOUNTER — Encounter: Payer: Self-pay | Admitting: Cardiology

## 2012-09-05 VITALS — BP 153/68 | HR 70 | Ht 67.0 in | Wt 180.0 lb

## 2012-09-05 DIAGNOSIS — E785 Hyperlipidemia, unspecified: Secondary | ICD-10-CM

## 2012-09-05 DIAGNOSIS — I350 Nonrheumatic aortic (valve) stenosis: Secondary | ICD-10-CM

## 2012-09-05 DIAGNOSIS — I359 Nonrheumatic aortic valve disorder, unspecified: Secondary | ICD-10-CM

## 2012-09-05 DIAGNOSIS — I2510 Atherosclerotic heart disease of native coronary artery without angina pectoris: Secondary | ICD-10-CM

## 2012-09-05 NOTE — Patient Instructions (Addendum)

## 2012-09-05 NOTE — Assessment & Plan Note (Signed)
Symptomatically stable on medical therapy status post coronary artery bypass grafting. I encouraged him to continue his regular exercise regimen. Followup is arranged.

## 2012-09-05 NOTE — Assessment & Plan Note (Signed)
Status post AVR as noted. Examination is stable.

## 2012-09-05 NOTE — Assessment & Plan Note (Signed)
Reports pending lipid followup with Dr. Sherril Croon soon.

## 2012-09-05 NOTE — Progress Notes (Signed)
Clinical Summary [name] is an 79 y.o.male last seen in February of this year. He states that he has been doing well. He and his wife exercise at a local gym at least 4 days a week. He reports no angina, no dyspnea beyond NYHA class II.  He states that he has been taking his medications regularly. He will be having lipid followup with Dr. Sherril Croon next month.   No Known Allergies  Current Outpatient Prescriptions  Medication Sig Dispense Refill  . amLODipine-benazepril (LOTREL) 10-40 MG per capsule Take 1 capsule by mouth daily.      [name] atorvastatin (LIPITOR) 20 MG tablet Take 1 tablet (20 mg total) by mouth daily.  30 tablet  6  . metoprolol tartrate (LOPRESSOR) 25 MG tablet Take 1 tablet (25 mg total) by mouth 3 (three) times daily.  90 tablet  6  . Multiple Vitamin (MULTIVITAMIN) tablet Take 1 tablet by mouth daily.        . nitroGLYCERIN (NITROSTAT) 0.4 MG SL tablet Place 0.4 mg under the tongue every 5 (five) minutes as needed.      . pantoprazole (PROTONIX) 40 MG tablet Take 40 mg by mouth daily.       . [DISCONTINUED] benazepril (LOTENSIN) 40 MG tablet Take 1 tablet (40 mg total) by mouth daily.  30 tablet  1  . [DISCONTINUED] potassium chloride (KLOR-CON) 10 MEQ CR tablet Take 2 tablets (20 mEq total) by mouth as needed. For 5 days then stop.  10 tablet  0   No current facility-administered medications for this visit.    Past Medical History  Diagnosis Date  . Coronary atherosclerosis of native coronary artery     DES LAD 10/07  . Carotid artery disease     Mild  . Essential hypertension, benign   . Mixed hyperlipidemia   . Aortic stenosis   . Aortic regurgitation   . Type 2 diabetes mellitus   . Peptic ulcer disease   . Blood transfusion   . GERD (gastroesophageal reflux disease)     Past Surgical History  Procedure Laterality Date  . Appendectomy    . Aortic valve replacement  03/12/2011    Procedure: AORTIC VALVE REPLACEMENT (AVR);  Surgeon: Kathlee Nations Suann Larry,  MD;  Location: [facility] OR;  Service: Open Heart Surgery;  Laterality: N/A;  Aortic Valve Replacement   . Coronary artery bypass graft  03/12/2011    Procedure: CORONARY ARTERY BYPASS GRAFTING (CABG);  Surgeon: Kathlee Nations Suann Larry, MD;  Location: [facility] OR;  Service: Open Heart Surgery;  Laterality: N/A;  Coronary Artery Bypass Graft on pump times  two utilizing left internal mammary artery and right greater saphenous vein, harvested endoscopically.  Transesophageal echocardiogram     Social History [name] reports that he has never smoked. He has never used smokeless tobacco. [name] reports that  drinks alcohol.  Review of Systems No palpitations, no claudication. Mild chronic ankle edema. No change in appetite. Otherwise negative.  Physical Examination Filed Vitals:   09/05/12 1312  BP: 153/68  Pulse: 70   Filed Weights   09/05/12 1312  Weight: 180 lb (81.647 kg)    Appears comfortable at rest. HEENT: Conjunctiva and lids normal, oropharynx is clear.  Neck: Supple, soft carotid bruits as noted previously.  Lungs: Clear without labored breathing, decreased at the bases.  Thorax: Well healing midline sternal incision, no drainage or erythema.  Cardiac: Regular rate and rhythm, very soft systolic murmur at the right base. No  S3 gallop.  Abdomen: Soft, nontender, bowel sounds present. Healing keyhole incisions.  Extremities: Trace edema, distal pulses 2+.    Problem List and Plan   CAD, NATIVE VESSEL Symptomatically stable on medical therapy status post coronary artery bypass grafting. I encouraged him to continue his regular exercise regimen. Followup is arranged.  Aortic stenosis Status post AVR as noted. Examination is stable.  HYPERLIPIDEMIA-MIXED Reports pending lipid followup with Dr. Sherril Croon soon.    Jonelle Sidle, M.D., F.A.C.C.

## 2012-09-21 ENCOUNTER — Telehealth: Payer: Self-pay | Admitting: Cardiology

## 2012-09-21 MED ORDER — METOPROLOL TARTRATE 25 MG PO TABS: 25.0000 mg | ORAL_TABLET | Freq: Three times a day (TID) | ORAL | Status: DC

## 2012-09-21 NOTE — Telephone Encounter (Signed)
metoprolol tartrate (LOPRESSOR) 25 MG tablet   Please call into [facility].  Almost out of medication

## 2012-12-02 IMAGING — CR DG CHEST 1V PORT
1 series · 1 of 1 positions shown · non-contrast
Comparison: 03/04/2011

CLINICAL DATA: Postop CABG.

PORTABLE CHEST - 1 VIEW

[AP]
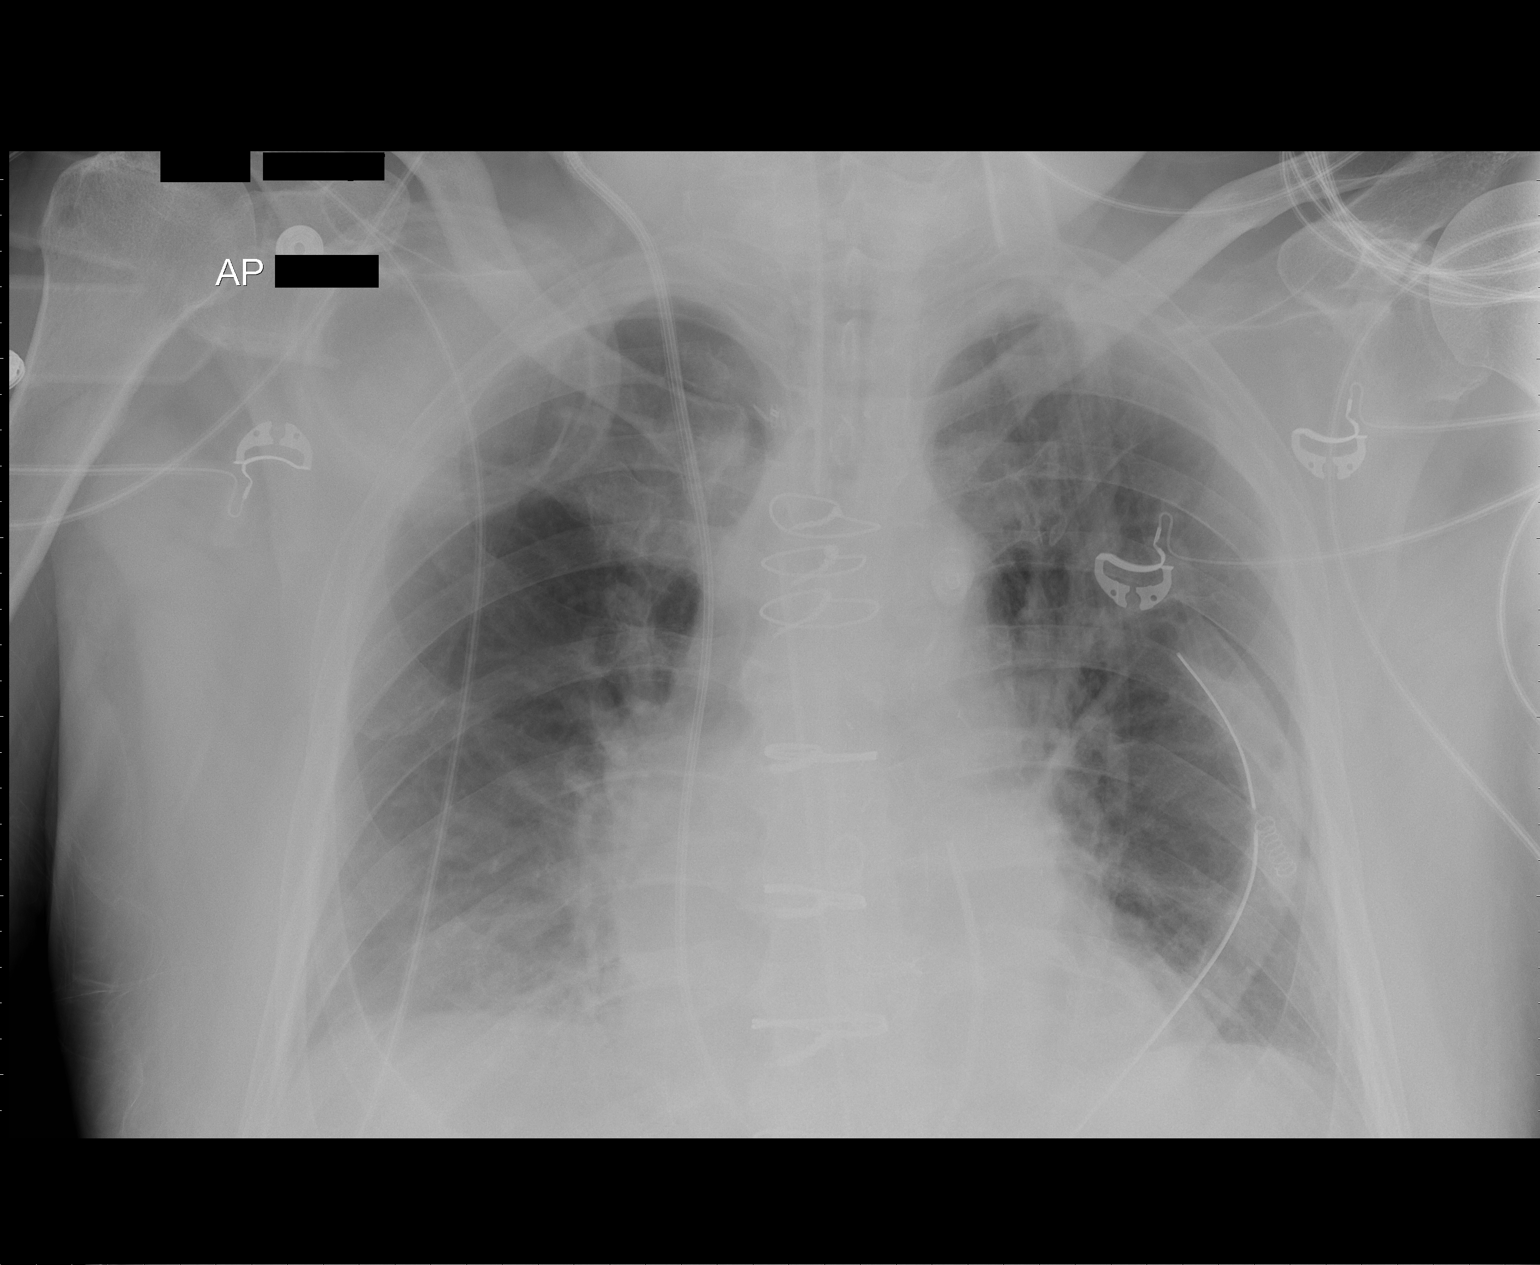

[1 of 1 positions shown; findings below may reference images not displayed]

FINDINGS: Changes of CABG.  Swan-Ganz catheter has been pulled back
with the tip now in the pulmonary outflow tract region.  Devices
otherwise stable.  No pneumothorax.  Bibasilar atelectasis and
vascular congestion.  Possible mild interstitial edema.
IMPRESSION: Swan-Ganz catheter tip now in the pulmonary outflow tract.

Increasing vascular congestion and bibasilar atelectasis.  Question
mild interstitial edema.

## 2012-12-03 IMAGING — CR DG CHEST 1V PORT
1 series · 1 of 1 positions shown · non-contrast
Comparison: 03/13/2011 and prior chest radiographs

CLINICAL DATA: 85-year-old male status post AVR and CABG.

PORTABLE CHEST - 1 VIEW

[view not recorded]
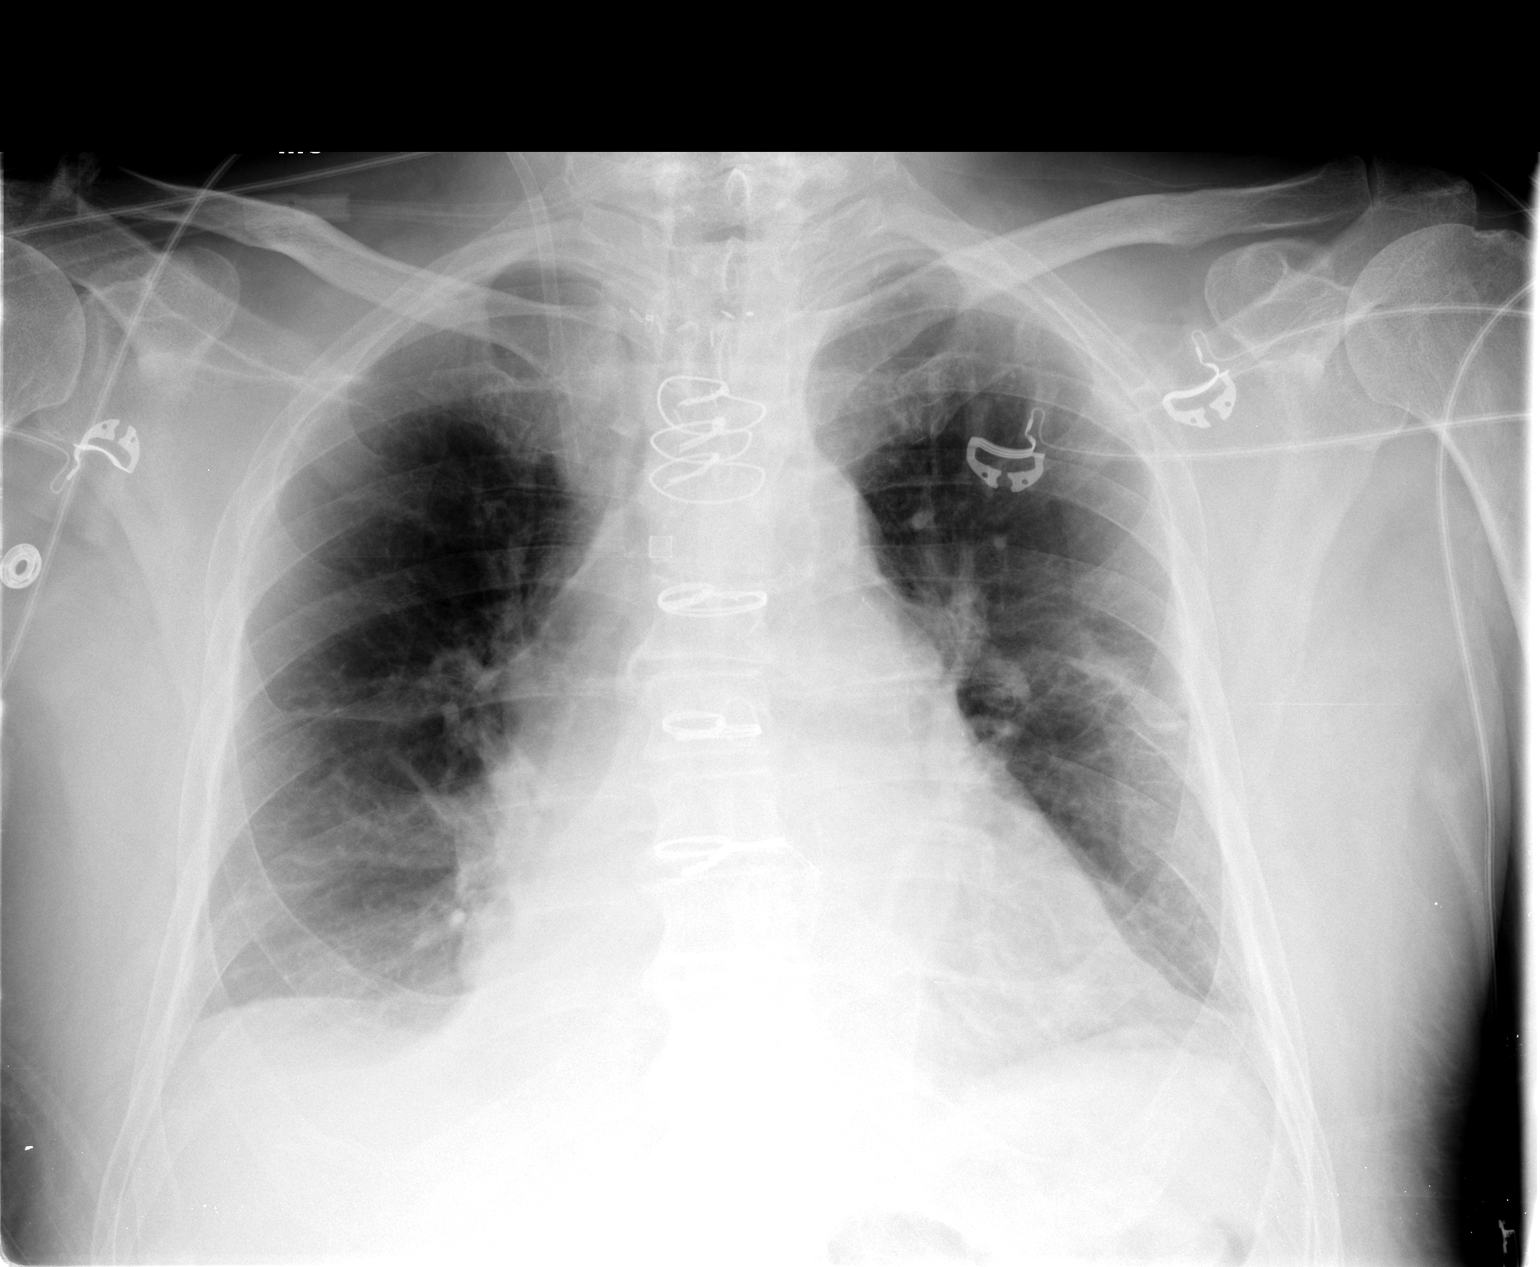

[1 of 1 positions shown; findings below may reference images not displayed]

FINDINGS: The cardiomediastinal silhouette is stable with evidence
of cardiac surgery and AVR.
The endotracheal tube, Swan-Ganz catheter, and mediastinal and left
thoracostomy tubes have been removed.
A right IJ central venous catheter sheath remains.
Decreased pulmonary vascular congestion noted with improved basilar
atelectasis.
There is no evidence of pneumothorax.
No large pleural effusions are identified.
IMPRESSION: Support tube removal and improved pulmonary vascular congestion.
No evidence of pneumothorax.

## 2012-12-05 IMAGING — CR DG ABDOMEN 1V
1 series · 1 of 1 positions shown · non-contrast
Comparison: None

CLINICAL DATA: CABG, nausea.

ABDOMEN - 1 VIEW

[t abdomen supine]
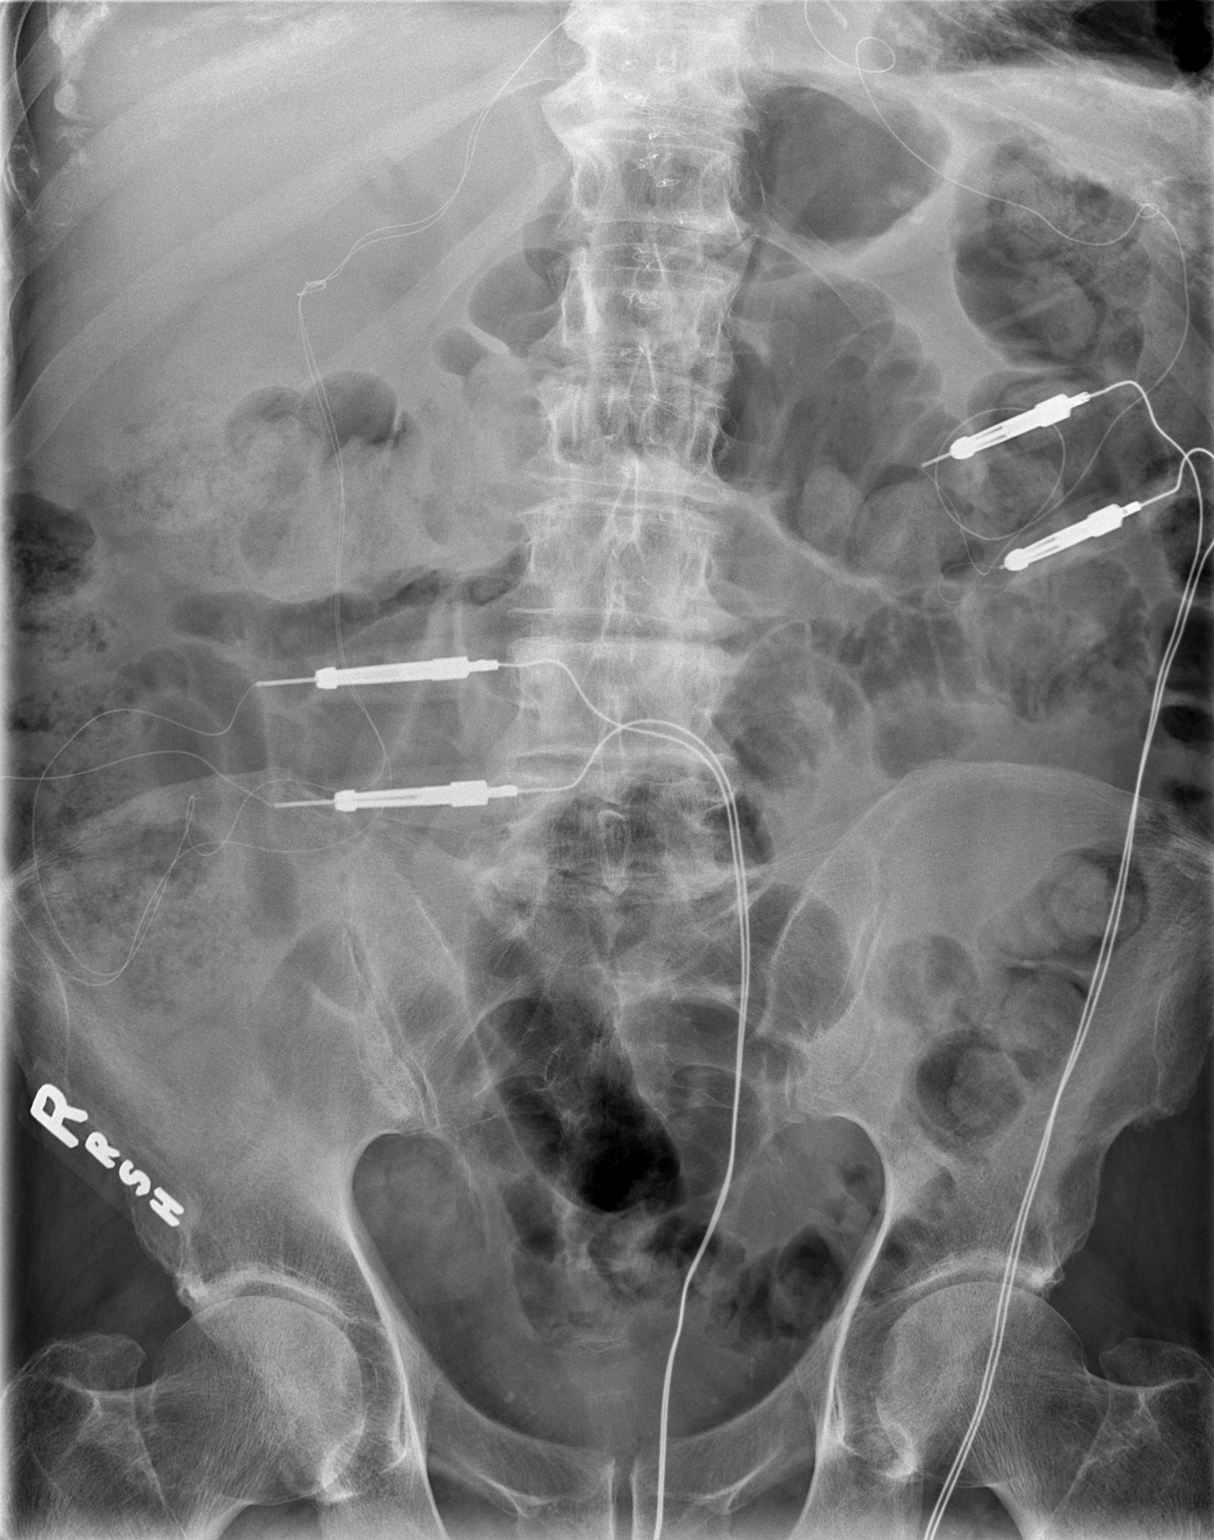

[1 of 1 positions shown; findings below may reference images not displayed]

FINDINGS: Gas throughout nondistended large and small bowel.  No
evidence of obstruction.  Moderate stool burden.  No organomegaly
or suspicious calcification.  No supine evidence of free air.
IMPRESSION: No evidence of bowel obstruction or free air.

## 2013-01-04 IMAGING — CR DG CHEST 2V
2 series · 2 of 2 positions shown · non-contrast
Comparison: 03/21/2011

CLINICAL DATA: coronary bypass, weakness

CHEST - 2 VIEW

[w chest pa]
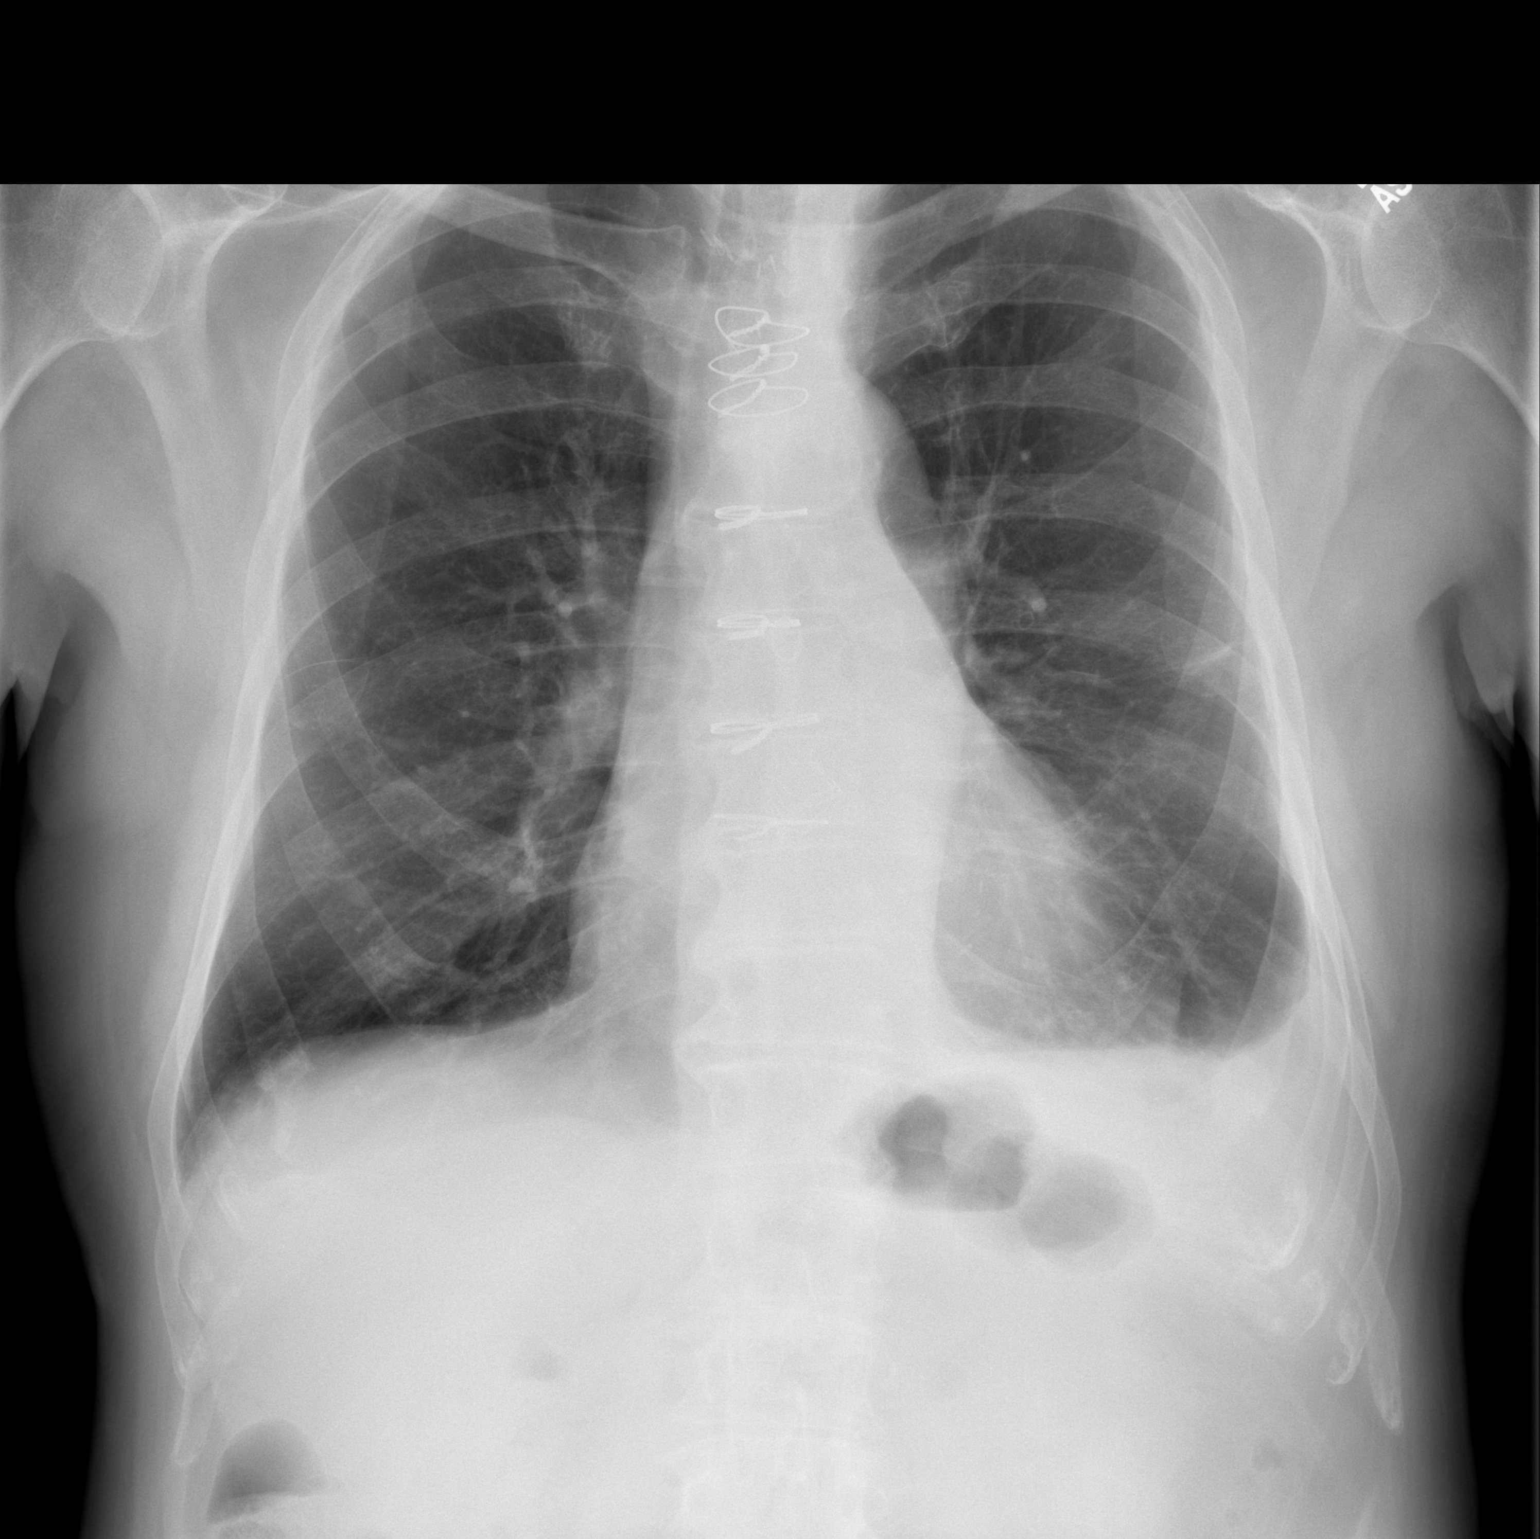

[w chest lat]
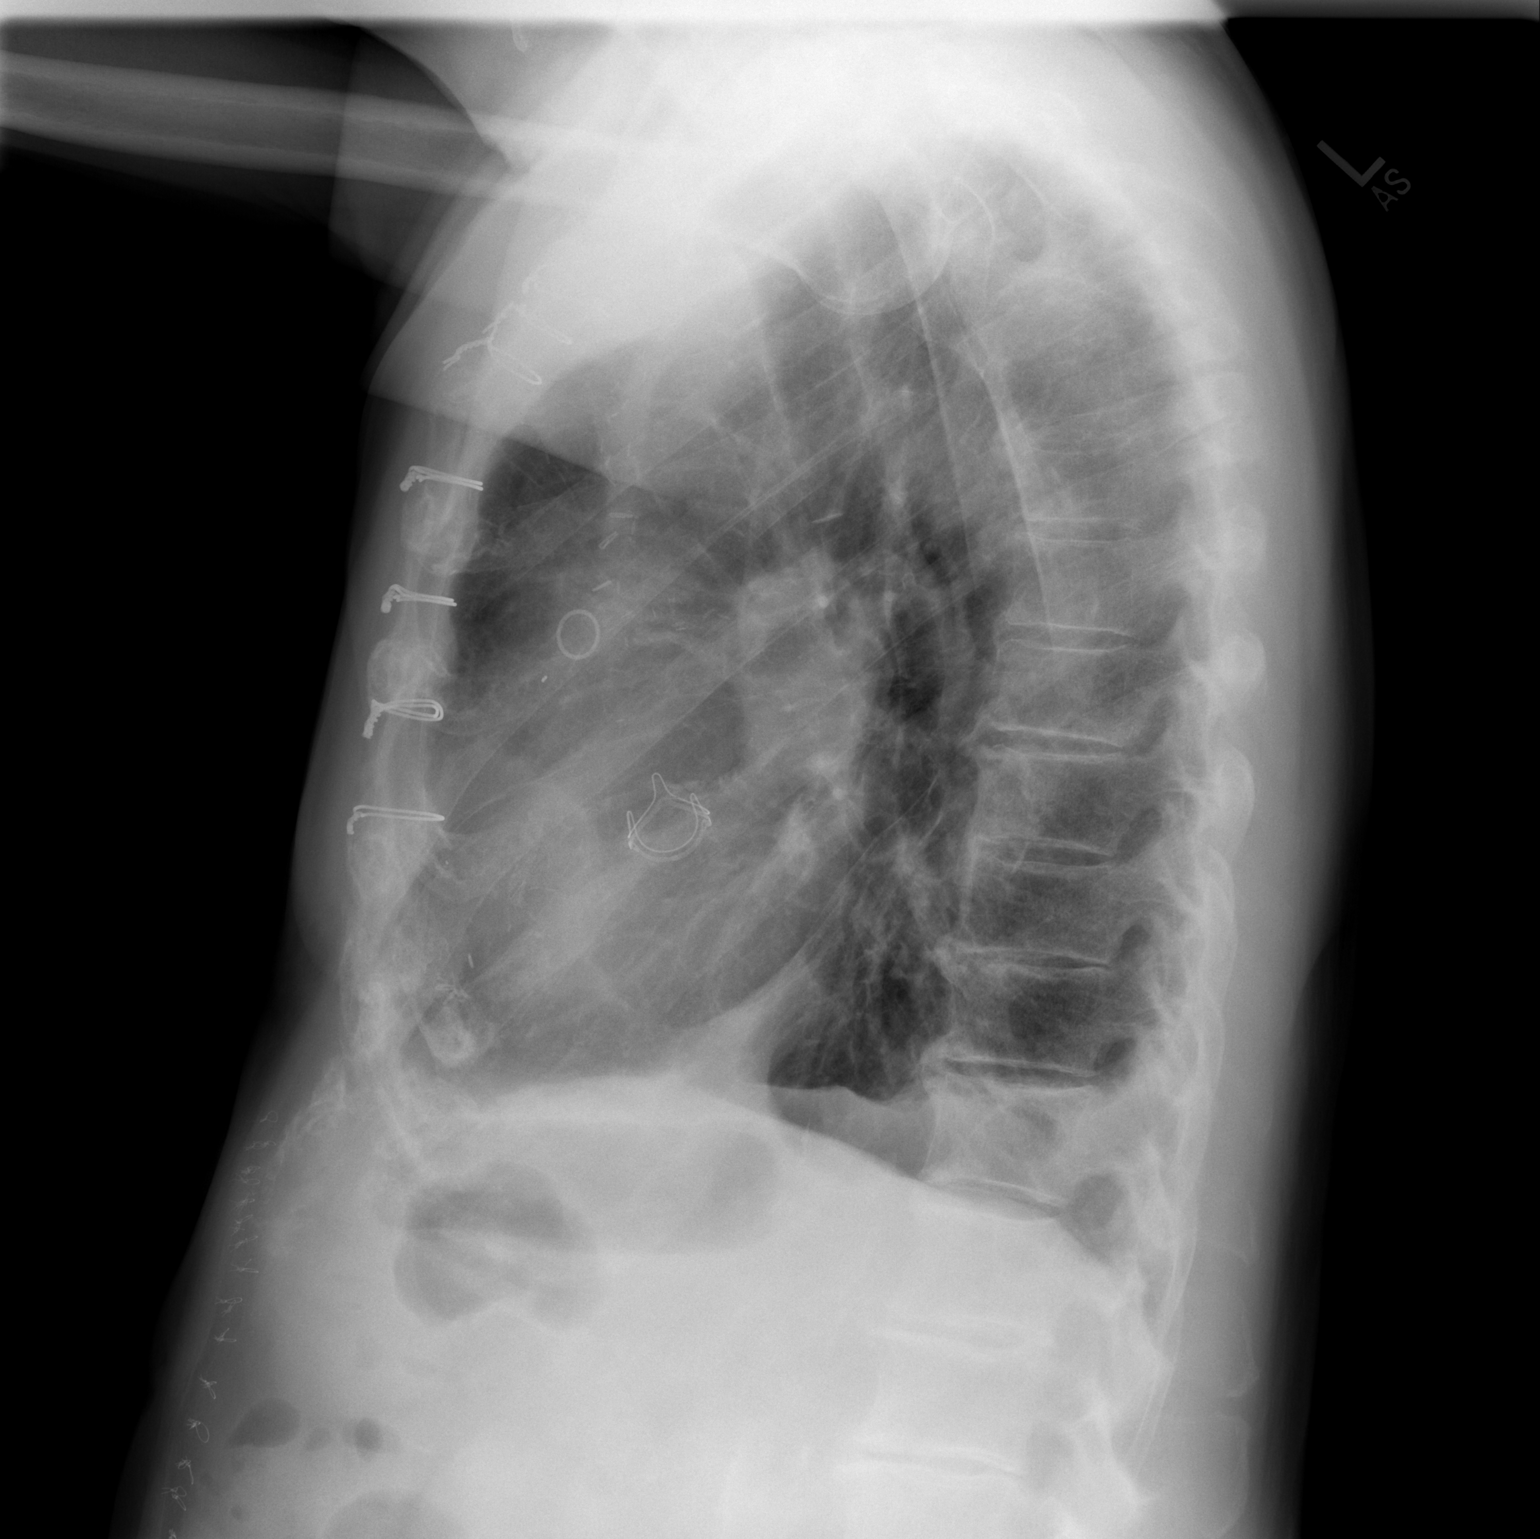

[2 of 2 positions shown; findings below may reference images not displayed]

FINDINGS: Previous coronary bypass and aortic valve replacement.
Borderline cardiomegaly without CHF or pneumonia.  Resolving
basilar atelectasis.  Trace left pleural effusion remains and left
midlung subpleural scarring.  No pneumothorax.  Trachea midline.
IMPRESSION: Resolving basilar atelectasis.  Residual small left effusion.

Stable postoperative appearance.  No pneumothorax.

## 2013-04-03 ENCOUNTER — Encounter: Payer: Self-pay | Admitting: Cardiology

## 2013-04-03 ENCOUNTER — Ambulatory Visit (INDEPENDENT_AMBULATORY_CARE_PROVIDER_SITE_OTHER): Payer: Medicare Other | Admitting: Cardiology

## 2013-04-03 VITALS — BP 167/78 | HR 65 | Ht 70.0 in | Wt 179.0 lb

## 2013-04-03 DIAGNOSIS — I2510 Atherosclerotic heart disease of native coronary artery without angina pectoris: Secondary | ICD-10-CM

## 2013-04-03 DIAGNOSIS — I359 Nonrheumatic aortic valve disorder, unspecified: Secondary | ICD-10-CM

## 2013-04-03 DIAGNOSIS — I10 Essential (primary) hypertension: Secondary | ICD-10-CM

## 2013-04-03 DIAGNOSIS — E785 Hyperlipidemia, unspecified: Secondary | ICD-10-CM

## 2013-04-03 DIAGNOSIS — I350 Nonrheumatic aortic (valve) stenosis: Secondary | ICD-10-CM

## 2013-04-03 NOTE — Assessment & Plan Note (Signed)
Status post bioprosthetic AVR.

## 2013-04-03 NOTE — Assessment & Plan Note (Signed)
Seems to be having some symptomatic relative hypotension at times. Asked him to keep a log of blood pressure based on home checks. May need to adjust his medications.

## 2013-04-03 NOTE — Assessment & Plan Note (Signed)
Continues on Lipitor, lipids followed by Dr. Vyas. 

## 2013-04-03 NOTE — Assessment & Plan Note (Signed)
No active angina status post CABG in February 2013. Continue medical therapy and exercise regimen.

## 2013-04-03 NOTE — Progress Notes (Signed)
Clinical Summary Edward Pena is an 80 y.o.male was seen in August 2014. He continues to exercise regularly, denies any active angina symptoms. He reports that he has "good days and bad days." The bad days seem to correlate somewhat with relatively low blood pressures. He still has systolics in the 130s to 140s typically.  He exercises at a local gym in [place]. Reports compliance with his medications.   No Known Allergies  Current Outpatient Prescriptions  Medication Sig Dispense Refill  . amLODipine-benazepril (LOTREL) 10-40 MG per capsule Take 1 capsule by mouth daily.      [name] atorvastatin (LIPITOR) 20 MG tablet Take 1 tablet (20 mg total) by mouth daily.  30 tablet  6  . metoprolol tartrate (LOPRESSOR) 25 MG tablet Take 1 tablet (25 mg total) by mouth 3 (three) times daily.  270 tablet  3  . Multiple Vitamin (MULTIVITAMIN) tablet Take 1 tablet by mouth daily.        . nitroGLYCERIN (NITROSTAT) 0.4 MG SL tablet Place 0.4 mg under the tongue every 5 (five) minutes as needed.      . pantoprazole (PROTONIX) 40 MG tablet Take 40 mg by mouth daily.       . [DISCONTINUED] benazepril (LOTENSIN) 40 MG tablet Take 1 tablet (40 mg total) by mouth daily.  30 tablet  1  . [DISCONTINUED] potassium chloride (KLOR-CON) 10 MEQ CR tablet Take 2 tablets (20 mEq total) by mouth as needed. For 5 days then stop.  10 tablet  0   No current facility-administered medications for this visit.    Past Medical History  Diagnosis Date  . Coronary atherosclerosis of native coronary artery     DES LAD 10/07  . Carotid artery disease     Mild  . Essential hypertension, benign   . Mixed hyperlipidemia   . Aortic stenosis   . Aortic regurgitation   . Type 2 diabetes mellitus   . Peptic ulcer disease   . Blood transfusion   . GERD (gastroesophageal reflux disease)     Past Surgical History  Procedure Laterality Date  . Appendectomy    . Aortic valve replacement  03/12/2011    Procedure: AORTIC VALVE  REPLACEMENT (AVR);  Surgeon: Kathlee Nations Suann Larry, MD;  Location: [facility] OR;  Service: Open Heart Surgery;  Laterality: N/A;  Aortic Valve Replacement   . Coronary artery bypass graft  03/12/2011    Procedure: CORONARY ARTERY BYPASS GRAFTING (CABG);  Surgeon: Kathlee Nations Suann Larry, MD;  Location: [facility] OR;  Service: Open Heart Surgery;  Laterality: N/A;  Coronary Artery Bypass Graft on pump times  two utilizing left internal mammary artery and right greater saphenous vein, harvested endoscopically.  Transesophageal echocardiogram     Social History Mr. Fawbush reports that he has never smoked. He has never used smokeless tobacco. Mr. Appleby reports that he drinks alcohol.  Review of Systems No palpitations, orthopnea or PND. Otherwise as outlined.  Physical Examination Filed Vitals:   04/03/13 1609  BP: 167/78  Pulse: 65   Filed Weights   04/03/13 1609  Weight: 179 lb (81.194 kg)    Appears comfortable at rest.  HEENT: Conjunctiva and lids normal, oropharynx is clear.  Neck: Supple, soft carotid bruits as noted previously.  Lungs: Clear without labored breathing, decreased at the bases.  Thorax: Well healing midline sternal incision, no drainage or erythema.  Cardiac: Regular rate and rhythm, very soft systolic murmur at the right base. No S3 gallop.  Abdomen: Soft,  nontender, bowel sounds present. Healing keyhole incisions.  Extremities: Trace edema, distal pulses 2+.    Problem List and Plan   CAD, NATIVE VESSEL No active angina status post CABG in February 2013. Continue medical therapy and exercise regimen.  Essential hypertension, benign Seems to be having some symptomatic relative hypotension at times. Asked him to keep a log of blood pressure based on home checks. May need to adjust his medications.  HYPERLIPIDEMIA-MIXED Continues on Lipitor, lipids followed by Dr. Sherril CroonVyas.  Aortic stenosis Status post bioprosthetic AVR.    Jonelle SidleSamuel G. Vernard Gram, M.D., F.A.C.C.

## 2013-04-03 NOTE — Patient Instructions (Signed)
Your physician wants you to follow-up in: 6 months You will receive a reminder letter in the mail two months in advance. If you don't receive a letter, please call our office to schedule the follow-up appointment.     Your physician recommends that you continue on your current medications as directed. Please refer to the Current Medication list given to you today.      Thank you for choosing [facility] !        

## 2013-09-07 ENCOUNTER — Other Ambulatory Visit: Payer: Self-pay | Admitting: Cardiology

## 2013-10-04 ENCOUNTER — Ambulatory Visit (INDEPENDENT_AMBULATORY_CARE_PROVIDER_SITE_OTHER): Payer: Medicare Other | Admitting: Cardiology

## 2013-10-04 ENCOUNTER — Encounter: Payer: Self-pay | Admitting: Cardiology

## 2013-10-04 VITALS — BP 139/65 | HR 63 | Ht 70.0 in | Wt 181.0 lb

## 2013-10-04 DIAGNOSIS — I10 Essential (primary) hypertension: Secondary | ICD-10-CM

## 2013-10-04 DIAGNOSIS — I359 Nonrheumatic aortic valve disorder, unspecified: Secondary | ICD-10-CM

## 2013-10-04 DIAGNOSIS — I2510 Atherosclerotic heart disease of native coronary artery without angina pectoris: Secondary | ICD-10-CM

## 2013-10-04 DIAGNOSIS — E785 Hyperlipidemia, unspecified: Secondary | ICD-10-CM

## 2013-10-04 MED ORDER — METOPROLOL TARTRATE 25 MG PO TABS: ORAL_TABLET | ORAL | Status: DC

## 2013-10-04 NOTE — Assessment & Plan Note (Signed)
History of bioprosthetic AVR due to aortic stenosis back in February 2013. Followup echocardiogram will be obtained for baseline prosthetic function.

## 2013-10-04 NOTE — Assessment & Plan Note (Signed)
Symptomatically stable on medical therapy following CABG in February 2013. ECG reviewed. Followup arranged in 6 months. I recommended that he continue his regular exercise regimen.

## 2013-10-04 NOTE — Assessment & Plan Note (Signed)
No change in current regimen. 

## 2013-10-04 NOTE — Patient Instructions (Signed)
There were no changes to your medications. Continue as directed. Your physician has requested that you have an echocardiogram. Echocardiography is a painless test that uses sound waves to create images of your heart. It provides your doctor with information about the size and shape of your heart and how well your heart's chambers and valves are working. This procedure takes approximately one hour. There are no restrictions for this procedure. Office will contact with results via phone or letter.   Your physician wants you to follow up in: 6 months.  You will receive a reminder letter in the mail one-two months in advance.  If you don't receive a letter, please call our office to schedule the follow up appointment

## 2013-10-04 NOTE — Progress Notes (Signed)
Clinical Summary [name] is an 80 y.o.male last seen in March. He continues to exercise at a local gym, usually 3 days a week, also goes up to the mall to walk with his wife. He reports stable NYHA class II dyspnea, no angina or palpitations.  ECG today shows sinus rhythm with prolonged PR interval and nonspecific T-wave changes.  He has not had a followup echocardiogram for baseline after aortic valve replacement.  Continues to follow with Dr. Sherril Croon for routine labs.  No Known Allergies  Current Outpatient Prescriptions  Medication Sig Dispense Refill  . amLODipine-benazepril (LOTREL) 10-40 MG per capsule Take 1 capsule by mouth daily.      [name] atorvastatin (LIPITOR) 20 MG tablet Take 1 tablet (20 mg total) by mouth daily.  30 tablet  6  . metoprolol tartrate (LOPRESSOR) 25 MG tablet TAKE 1 TABLET THREE TIMES A DAY  270 tablet  3  . Multiple Vitamin (MULTIVITAMIN) tablet Take 1 tablet by mouth daily.        . nitroGLYCERIN (NITROSTAT) 0.4 MG SL tablet Place 0.4 mg under the tongue every 5 (five) minutes as needed.      . pantoprazole (PROTONIX) 40 MG tablet Take 40 mg by mouth daily.       . [DISCONTINUED] benazepril (LOTENSIN) 40 MG tablet Take 1 tablet (40 mg total) by mouth daily.  30 tablet  1  . [DISCONTINUED] potassium chloride (KLOR-CON) 10 MEQ CR tablet Take 2 tablets (20 mEq total) by mouth as needed. For 5 days then stop.  10 tablet  0   No current facility-administered medications for this visit.    Past Medical History  Diagnosis Date  . Coronary atherosclerosis of native coronary artery     DES LAD 10/07  . Carotid artery disease     Mild  . Essential hypertension, benign   . Mixed hyperlipidemia   . Aortic stenosis   . Aortic regurgitation   . Type 2 diabetes mellitus   . Peptic ulcer disease   . Blood transfusion   . GERD (gastroesophageal reflux disease)     Past Surgical History  Procedure Laterality Date  . Appendectomy    . Aortic valve replacement   03/12/2011    Procedure: AORTIC VALVE REPLACEMENT (AVR);  Surgeon: Kathlee Nations Suann Larry, MD;  Location: [facility] OR;  Service: Open Heart Surgery;  Laterality: N/A;  Aortic Valve Replacement   . Coronary artery bypass graft  03/12/2011    Procedure: CORONARY ARTERY BYPASS GRAFTING (CABG);  Surgeon: Kathlee Nations Suann Larry, MD;  Location: [facility] OR;  Service: Open Heart Surgery;  Laterality: N/A;  Coronary Artery Bypass Graft on pump times  two utilizing left internal mammary artery and right greater saphenous vein, harvested endoscopically.  Transesophageal echocardiogram     Social History [name] reports that he has never smoked. He has never used smokeless tobacco. [name] reports that he drinks alcohol.  Review of Systems Hard of hearing. Stable appetite. Other systems reviewed and negative except as outlined.   Physical Examination Filed Vitals:   10/04/13 0951  BP: 139/65  Pulse: 63   Filed Weights   10/04/13 0951  Weight: 181 lb (82.101 kg)    Appears comfortable at rest.  HEENT: Conjunctiva and lids normal, oropharynx is clear.  Neck: Supple, soft carotid bruits as noted previously.  Lungs: Clear without labored breathing, decreased at the bases.  Thorax: Well healing midline sternal incision, no drainage or erythema.  Cardiac: Regular rate  and rhythm, very soft systolic murmur at the right base. No S3 gallop.  Abdomen: Soft, nontender, bowel sounds present. Healing keyhole incisions.  Extremities: Trace edema, distal pulses 2+.  Skin: Warm and dry. Musculoskeletal: No kyphosis. Neuropsychiatric: Alert and oriented x3, affect appropriate.   Problem List and Plan   Aortic valve disease History of bioprosthetic AVR due to aortic stenosis back in February 2013. Followup echocardiogram will be obtained for baseline prosthetic function.  CAD, NATIVE VESSEL Symptomatically stable on medical therapy following CABG in February 2013. ECG reviewed. Followup arranged in 6 months. I  recommended that he continue his regular exercise regimen.  Essential hypertension, benign No change in current regimen.  HYPERLIPIDEMIA-MIXED Continues on Lipitor, keep followup with Dr. Sherril Croon.    Jonelle Sidle, M.D., F.A.C.C.

## 2013-10-04 NOTE — Assessment & Plan Note (Signed)
Continues on Lipitor, keep follow-up with Dr. Vyas. 

## 2013-10-12 ENCOUNTER — Other Ambulatory Visit: Payer: Medicare Other

## 2013-10-18 ENCOUNTER — Other Ambulatory Visit (INDEPENDENT_AMBULATORY_CARE_PROVIDER_SITE_OTHER): Payer: Medicare Other

## 2013-10-18 ENCOUNTER — Other Ambulatory Visit: Payer: Self-pay

## 2013-10-18 DIAGNOSIS — I359 Nonrheumatic aortic valve disorder, unspecified: Secondary | ICD-10-CM

## 2013-10-18 DIAGNOSIS — I2510 Atherosclerotic heart disease of native coronary artery without angina pectoris: Secondary | ICD-10-CM

## 2013-10-20 ENCOUNTER — Telehealth: Payer: Self-pay | Admitting: *Deleted

## 2013-10-20 NOTE — Telephone Encounter (Signed)
Message copied by Eustace MooreANDERSON, Halo Shevlin M on Fri Oct 20, 2013  1:42 PM ------      Message from: MCDOWELL, Illene BolusSAMUEL G      Created: Wed Oct 18, 2013  4:26 PM       Reviewed report. Status post bioprosthetic AVR. Mean gradient 13 mm of mercury. SStudy to serve as baseline going forward. No change in current regimen. ------

## 2013-10-23 NOTE — Telephone Encounter (Signed)
Patient informed. 

## 2013-10-23 NOTE — Telephone Encounter (Signed)
Just got back into town and is at home now

## 2014-01-15 ENCOUNTER — Encounter (HOSPITAL_COMMUNITY): Payer: Self-pay | Admitting: *Deleted

## 2014-01-15 ENCOUNTER — Encounter (HOSPITAL_COMMUNITY)
Admission: RE | Disposition: A | Discharge status: Home / Self Care | Payer: Self-pay | Source: Ambulatory Visit | Attending: Ophthalmology

## 2014-01-15 ENCOUNTER — Ambulatory Visit (HOSPITAL_COMMUNITY)
Admission: RE | Admit: 2014-01-15 | Discharge: 2014-01-15 | Disposition: A | Discharge status: Home / Self Care | Payer: Medicare Other | Source: Ambulatory Visit | Attending: Ophthalmology | Admitting: Ophthalmology

## 2014-01-15 DIAGNOSIS — H269 Unspecified cataract: Secondary | ICD-10-CM | POA: Diagnosis present

## 2014-01-15 HISTORY — PX: YAG LASER APPLICATION: SHX6189

## 2014-01-15 SURGERY — TREATMENT, USING YAG LASER
Anesthesia: LOCAL | Laterality: Left

## 2014-01-15 MED ORDER — TETRACAINE HCL 0.5 % OP SOLN
OPHTHALMIC | Status: AC
  Filled 2014-01-15: qty 2

## 2014-01-15 MED ORDER — TROPICAMIDE 1 % OP SOLN
1.0000 [drp] | OPHTHALMIC | Status: AC
  Administered 2014-01-15 (×3): 1 [drp] via OPHTHALMIC

## 2014-01-15 MED ORDER — APRACLONIDINE HCL 1 % OP SOLN
1.0000 [drp] | OPHTHALMIC | Status: AC
  Administered 2014-01-15 (×3): 1 [drp] via OPHTHALMIC

## 2014-01-15 MED ORDER — APRACLONIDINE HCL 1 % OP SOLN
OPHTHALMIC | Status: AC
  Filled 2014-01-15: qty 0.1

## 2014-01-15 MED ORDER — TROPICAMIDE 1 % OP SOLN
OPHTHALMIC | Status: AC
  Filled 2014-01-15: qty 3

## 2014-01-15 MED ORDER — TETRACAINE HCL 0.5 % OP SOLN
1.0000 [drp] | Freq: Once | OPHTHALMIC | Status: AC
  Administered 2014-01-15: 1 [drp] via OPHTHALMIC

## 2014-01-15 NOTE — Discharge Instructions (Signed)
Edward Pena  01/15/2014     Instructions    Activity: No Restrictions.   Diet: Resume Diet you were on at home.   Pain Medication: Tylenol if Needed.   CONTACT YOUR DOCTOR IF YOU HAVE PAIN, REDNESS IN YOUR EYE, OR DECREASED VISION.   Follow-up: 02/06/2014 at 11;15  with Susa Simmonds, MD.       Dr. Lita Mains: [phone]     If you find that you cannot contact your physician, but feel that your signs and   Symptoms warrant a physician's attention, call the Emergency Room at   [phone] ext.[phone].

## 2014-01-15 NOTE — Brief Op Note (Signed)
Edward Pena 01/15/2014  Susa Simmonds, MD  Pre-op Diagnosis:  secondary cataract left eye  Post-op Diagnosis:   same  Yag laser self-test completed: Yes.    Indications:  See scanned office H&P  Procedure:  YAG laser posterior capsulotomy OS  Eye protection worn by staff:  Yes.   Laser In Use sign on door:  Yes.    Laser:  {LUMENIS YAG laser  Power Setting:  1.7 mJ/burst Anatomical site treated:  Posterior capsule OS Number of applications:  20 Total energy delivered: 33.8 mJ Results:  Visual axis cleared with central opening in posterior capsule  Patient was instructed to go to the office, as previously scheduled, for intraocular pressure:  No.  Patient verbalizes understanding of discharge instructions:  Yes.    The patient tolerated the procedure well.  There were no complications

## 2014-01-15 NOTE — Op Note (Signed)
None required

## 2014-01-15 NOTE — H&P (Signed)
I have reviewed the pre printed H&P, the patient was re-examined, and I have identified no significant interval changes in the patient's medical condition.  There is no change in the plan of care since the history and physical of record. 

## 2014-01-16 ENCOUNTER — Encounter (HOSPITAL_COMMUNITY): Payer: Self-pay | Admitting: Ophthalmology

## 2014-02-12 ENCOUNTER — Encounter (HOSPITAL_COMMUNITY)
Admission: RE | Disposition: A | Discharge status: Home / Self Care | Payer: Self-pay | Source: Ambulatory Visit | Attending: Ophthalmology

## 2014-02-12 ENCOUNTER — Ambulatory Visit (HOSPITAL_COMMUNITY)
Admission: RE | Admit: 2014-02-12 | Discharge: 2014-02-12 | Disposition: A | Discharge status: Home / Self Care | Payer: Medicare Other | Source: Ambulatory Visit | Attending: Ophthalmology | Admitting: Ophthalmology

## 2014-02-12 ENCOUNTER — Encounter (HOSPITAL_COMMUNITY): Payer: Self-pay | Admitting: *Deleted

## 2014-02-12 DIAGNOSIS — H26491 Other secondary cataract, right eye: Secondary | ICD-10-CM | POA: Diagnosis present

## 2014-02-12 HISTORY — PX: YAG LASER APPLICATION: SHX6189

## 2014-02-12 SURGERY — TREATMENT, USING YAG LASER
Anesthesia: LOCAL | Laterality: Right

## 2014-02-12 MED ORDER — APRACLONIDINE HCL 1 % OP SOLN
1.0000 [drp] | OPHTHALMIC | Status: AC
  Administered 2014-02-12 (×3): 1 [drp] via OPHTHALMIC

## 2014-02-12 MED ORDER — TETRACAINE HCL 0.5 % OP SOLN: 1.0000 [drp] | Freq: Once | OPHTHALMIC | Status: DC

## 2014-02-12 MED ORDER — CYCLOPENTOLATE-PHENYLEPHRINE 0.2-1 % OP SOLN
1.0000 [drp] | OPHTHALMIC | Status: AC
  Administered 2014-02-12 (×3): 1 [drp] via OPHTHALMIC

## 2014-02-12 MED ORDER — TETRACAINE HCL 0.5 % OP SOLN
OPHTHALMIC | Status: AC
  Filled 2014-02-12: qty 2

## 2014-02-12 MED ORDER — APRACLONIDINE HCL 1 % OP SOLN
OPHTHALMIC | Status: AC
  Filled 2014-02-12: qty 0.1

## 2014-02-12 MED ORDER — CYCLOPENTOLATE-PHENYLEPHRINE OP SOLN OPTIME - NO CHARGE
OPHTHALMIC | Status: AC
  Filled 2014-02-12: qty 2

## 2014-02-12 NOTE — Discharge Instructions (Signed)
Gorman L Meloche  02/12/2014     Instructions    Activity: No Restrictions.   Diet: Resume Diet you were on at home.   Pain Medication: Tylenol if Needed.   CONTACT YOUR DOCTOR IF YOU HAVE PAIN, REDNESS IN YOUR EYE, OR DECREASED VISION.   Follow-up:03/06/2014 @ 11:15am with Susa Simmondsarroll F Haines, MD.     Dr. Lita MainsHaines: [phone]     If you find that you cannot contact your physician, but feel that your signs and   Symptoms warrant a physician's attention, call the Emergency Room at   [phone] ext.[phone].

## 2014-02-12 NOTE — Brief Op Note (Signed)
Edward Pena 02/12/2014  Susa Simmondsarroll [name] Stare, MD  Pre-op Diagnosis:  secondary cataract right eye  Post-op Diagnosis: * No post-op diagnosis entered *  Yag laser self-test completed: Yes.    Indications:  See scanned office H&P  Procedure: YAG capsulotomy  right eye  Eye protection worn by staff:  Yes.   Laser In Use sign on door:  Yes.    Laser:  {LUMENIS YAG/SLT LASER  Selecta Duet  Power Setting:  1.5 mJ/burst Anatomical site treated:  posterior capsule OD Number of applications:  35 Total energy delivered: 51.1  mJ Results:  Open capsule with clear visual axis/no complications/pt tolerated procedure well  Patient was instructed to go to the office, as previously scheduled, for intraocular pressure:  No.  Patient verbalizes understanding of discharge instructions:  Yes.

## 2014-02-12 NOTE — H&P (Signed)
I have reviewed the pre printed H&P, the patient was re-examined, and I have identified no significant interval changes in the patient's medical condition.  There is no change in the plan of care since the history and physical of record. 

## 2014-02-13 ENCOUNTER — Encounter (HOSPITAL_COMMUNITY): Payer: Self-pay | Admitting: Ophthalmology

## 2014-04-16 ENCOUNTER — Encounter: Payer: Self-pay | Admitting: Cardiology

## 2014-04-16 ENCOUNTER — Ambulatory Visit (INDEPENDENT_AMBULATORY_CARE_PROVIDER_SITE_OTHER): Payer: Medicare Other | Admitting: Cardiology

## 2014-04-16 VITALS — BP 142/68 | HR 60 | Ht 70.0 in | Wt 182.8 lb

## 2014-04-16 DIAGNOSIS — E782 Mixed hyperlipidemia: Secondary | ICD-10-CM

## 2014-04-16 DIAGNOSIS — I2510 Atherosclerotic heart disease of native coronary artery without angina pectoris: Secondary | ICD-10-CM | POA: Diagnosis not present

## 2014-04-16 DIAGNOSIS — I359 Nonrheumatic aortic valve disorder, unspecified: Secondary | ICD-10-CM

## 2014-04-16 DIAGNOSIS — I10 Essential (primary) hypertension: Secondary | ICD-10-CM | POA: Diagnosis not present

## 2014-04-16 NOTE — Patient Instructions (Signed)
Your physician wants you to follow-up in: 6 months with DR. McDowell You will receive a reminder letter in the mail two months in advance. If you don't receive a letter, please call our office to schedule the follow-up appointment.  Your physician recommends that you continue on your current medications as directed. Please refer to the Current Medication list given to you today.  Thank you for choosing [facility]!!    

## 2014-04-16 NOTE — Progress Notes (Signed)
Cardiology Office Note  Date: 04/16/2014   ID: Edward IVINS, DOB Sep 04, 1926, MRN [id number]  PCP: Edward Pena., MD  Primary Cardiologist: Edward Dell, MD   Chief Complaint  Patient presents with  . Coronary Artery Disease  . Status post AVR    History of Present Illness: Edward Pena is an 81 y.o. male last seen in September 2015. He is status post CABG and AVR back in 2013 as noted below. He comes in today for a routine visit, reports no angina symptoms or nitroglycerin use. He continues to exercise regularly at a local gym. We discussed his medications, there have been no significant changes.  He continues to follow with Dr. Sherril Croon, indicates repeat lab work and testing since I last saw him, he remains on Lipitor as well.  Follow-up echocardiogram from October of last year's reviewed below, bioprosthetic AVR head mean gradient of 13 mmHg.   Past Medical History  Diagnosis Date  . Coronary atherosclerosis of native coronary artery     DES LAD 10/07  . Carotid artery disease     Mild  . Essential hypertension, benign   . Mixed hyperlipidemia   . Aortic stenosis   . Aortic regurgitation   . Type 2 diabetes mellitus   . Peptic ulcer disease   . Blood transfusion   . GERD (gastroesophageal reflux disease)     Past Surgical History  Procedure Laterality Date  . Appendectomy    . Aortic valve replacement  03/12/2011    Procedure: AORTIC VALVE REPLACEMENT (AVR);  Surgeon: Kathlee Nations Suann Larry, MD;  Location: [facility] OR;  Service: Open Heart Surgery;  Laterality: N/A;  Aortic Valve Replacement   . Coronary artery bypass graft  03/12/2011    Procedure: CORONARY ARTERY BYPASS GRAFTING (CABG);  Surgeon: Kathlee Nations Suann Larry, MD;  Location: [facility] OR;  Service: Open Heart Surgery;  Laterality: N/A;  Coronary Artery Bypass Graft on pump times  two utilizing left internal mammary artery and right greater saphenous vein, harvested endoscopically.  Transesophageal echocardiogram   . Yag  laser application Left 01/15/2014    Procedure: YAG LASER APPLICATION;  Surgeon: Susa Simmonds, MD;  Location: [facility] ORS;  Service: Ophthalmology;  Laterality: Left;  . Yag laser application Right 02/12/2014    Procedure: YAG LASER APPLICATION;  Surgeon: Susa Simmonds, MD;  Location: [facility] ORS;  Service: Ophthalmology;  Laterality: Right;    Current Outpatient Prescriptions  Medication Sig Dispense Refill  . amLODipine-benazepril (LOTREL) 10-40 MG per capsule Take 1 capsule by mouth daily.    [name] atorvastatin (LIPITOR) 20 MG tablet Take 1 tablet (20 mg total) by mouth daily. 30 tablet 6  . metoprolol tartrate (LOPRESSOR) 25 MG tablet TAKE 1 TABLET THREE TIMES A DAY 270 tablet 3  . Multiple Vitamin (MULTIVITAMIN) tablet Take 1 tablet by mouth daily.      . nitroGLYCERIN (NITROSTAT) 0.4 MG SL tablet Place 0.4 mg under the tongue every 5 (five) minutes as needed for chest pain.     . pantoprazole (PROTONIX) 40 MG tablet Take 40 mg by mouth daily.     . [DISCONTINUED] benazepril (LOTENSIN) 40 MG tablet Take 1 tablet (40 mg total) by mouth daily. 30 tablet 1  . [DISCONTINUED] potassium chloride (KLOR-CON) 10 MEQ CR tablet Take 2 tablets (20 mEq total) by mouth as needed. For 5 days then stop. 10 tablet 0   No current facility-administered medications for this visit.    Allergies:  Review of patient's  allergies indicates no known allergies.   Social History: The patient  reports that he has never smoked. He has never used smokeless tobacco. He reports that he drinks alcohol. He reports that he does not use illicit drugs.   ROS:  Please see the history of present illness. Otherwise, complete review of systems is positive for transient epigastric discomfort - states he had a negative GI workup.  All other systems are reviewed and negative.   Physical Exam: VS:  BP 142/68 mmHg  Pulse 60  Ht 5\' 10"  (1.778 m)  Wt 182 lb 12.8 oz (82.918 kg)  BMI 26.23 kg/m2  SpO2 98%, BMI Body mass index is 26.23  kg/(m^2).  Wt Readings from Last 3 Encounters:  04/16/14 182 lb 12.8 oz (82.918 kg)  10/04/13 181 lb (82.101 kg)  04/03/13 179 lb (81.194 kg)     Appears comfortable at rest.  HEENT: Conjunctiva and lids normal, oropharynx is clear.  Neck: Supple, soft carotid bruits as noted previously.  Lungs: Clear without labored breathing, decreased at the bases.  Thorax: Well healed midline sternal incision.  Cardiac: Regular rate and rhythm, very soft systolic murmur at the right base. No S3 gallop.  Abdomen: Soft, nontender, bowel sounds present  Extremities: Trace edema, distal pulses 2+.  Skin: Warm and dry. Musculoskeletal: No kyphosis. Neuropsychiatric: Alert and oriented x3, affect appropriate.   ECG: ECG is not ordered today.  Other Studies Reviewed Today:  Echocardiogram 10/18/2013: Study Conclusions  - Left ventricle: The cavity size was normal. Wall thickness was normal. Systolic function was normal. The estimated ejection fraction was in the range of 60% to 65%. There is diastolic dysfunction, indeterminant grade. E/e&' 15 consistent with elevated LA pressure. Wall motion was normal; there were no regional wall motion abnormalities. - Aortic valve: Mean gradient (S): 13 mm Hg. Peak gradient (S): 27 mm Hg. VTI ratio of LVOT to aortic valve: 0.55. Valve area (VTI): 1.92 cm^2. Valve area (Vmax): 1.76 cm^2. Valve area (Vmean): 1.67 cm^2. - Mitral valve: Mildly calcified annulus. Mildly thickened leaflets . - Technically adequate study.   Assessment and Plan:  1. Symptomatically stable multivessel CAD status post CABG in 2013. Continue regular exercise plan.  2. Status post bioprosthetic AVR, echocardiogram from October 2015 noted above.  3. Hyperlipidemia, on statin therapy, followed by Dr. Sherril CroonVyas.  4. Essential hypertension, no changes made to current regimen.  Current medicines were reviewed with the patient today.  Disposition: FU with me  in 6 months.   Signed, Jonelle SidleSamuel G. McDowell, MD, [facility]onFACC 04/16/2014 11:42 AM    [facility] [address], [place], [place] [address] Phone: [phone]; Fax: [phone]

## 2014-10-16 ENCOUNTER — Encounter: Payer: Self-pay | Admitting: Cardiology

## 2014-10-16 ENCOUNTER — Ambulatory Visit (INDEPENDENT_AMBULATORY_CARE_PROVIDER_SITE_OTHER): Payer: Medicare Other | Admitting: Cardiology

## 2014-10-16 VITALS — BP 142/58 | HR 63 | Ht 70.0 in | Wt 186.0 lb

## 2014-10-16 DIAGNOSIS — I2510 Atherosclerotic heart disease of native coronary artery without angina pectoris: Secondary | ICD-10-CM | POA: Diagnosis not present

## 2014-10-16 DIAGNOSIS — I359 Nonrheumatic aortic valve disorder, unspecified: Secondary | ICD-10-CM | POA: Diagnosis not present

## 2014-10-16 DIAGNOSIS — I10 Essential (primary) hypertension: Secondary | ICD-10-CM | POA: Diagnosis not present

## 2014-10-16 DIAGNOSIS — E782 Mixed hyperlipidemia: Secondary | ICD-10-CM | POA: Diagnosis not present

## 2014-10-16 NOTE — Progress Notes (Signed)
Cardiology Office Note  Date: 10/16/2014   ID: DAISY MCNEEL, DOB 09/29/1926, MRN [id number]  PCP: Ignatius Specking., MD  Primary Cardiologist: Nona Dell, MD   Chief Complaint  Patient presents with  . Coronary Artery Disease  . Status post AVR    History of Present Illness: Edward Pena is an 81 y.o. male last seen in April. He presents for a routine follow-up visit. No significant angina symptoms reported. He has NYHA class II dyspnea, no palpitations. He has done some traveling with his wife, was in [facility] recently.  We reviewed his medications which are outlined below. He has had no obvious intolerances. Plans to see Dr. Sherril Croon in the next month for a physical with lab work. He has not required any nitroglycerin.  Echocardiogram from October 2015 is noted below and revealed normal AVR function.  Blood pressure is reasonable today with systolics in the 140s. He states that he has been following a low salt diet. He continues to exercise as he is able, mainly with walking.   Past Medical History  Diagnosis Date  . Coronary atherosclerosis of native coronary artery     DES LAD 10/07  . Carotid artery disease (HCC)     Mild  . Essential hypertension, benign   . Mixed hyperlipidemia   . Aortic stenosis   . Aortic regurgitation   . Type 2 diabetes mellitus (HCC)   . Peptic ulcer disease   . Blood transfusion   . GERD (gastroesophageal reflux disease)     Past Surgical History  Procedure Laterality Date  . Appendectomy    . Aortic valve replacement  03/12/2011    Procedure: AORTIC VALVE REPLACEMENT (AVR);  Surgeon: Kathlee Nations Suann Larry, MD;  Location: [facility] OR;  Service: Open Heart Surgery;  Laterality: N/A;  Aortic Valve Replacement   . Coronary artery bypass graft  03/12/2011    Procedure: CORONARY ARTERY BYPASS GRAFTING (CABG);  Surgeon: Kathlee Nations Suann Larry, MD;  Location: [facility] OR;  Service: Open Heart Surgery;  Laterality: N/A;  Coronary Artery Bypass Graft on pump  times  two utilizing left internal mammary artery and right greater saphenous vein, harvested endoscopically.  Transesophageal echocardiogram   . Yag laser application Left 01/15/2014    Procedure: YAG LASER APPLICATION;  Surgeon: Susa Simmonds, MD;  Location: [facility]S;  Service: Ophthalmology;  Laterality: Left;  . Yag laser application Right 02/12/2014    Procedure: YAG LASER APPLICATION;  Surgeon: Susa Simmonds, MD;  Location: [facility] ORS;  Service: Ophthalmology;  Laterality: Right;    Current Outpatient Prescriptions  Medication Sig Dispense Refill  . amLODipine-benazepril (LOTREL) 10-40 MG per capsule Take 1 capsule by mouth daily.    [name] atorvastatin (LIPITOR) 20 MG tablet Take 1 tablet (20 mg total) by mouth daily. 30 tablet 6  . metoprolol tartrate (LOPRESSOR) 25 MG tablet TAKE 1 TABLET THREE TIMES A DAY 270 tablet 3  . Multiple Vitamin (MULTIVITAMIN) tablet Take 1 tablet by mouth daily.      . nitroGLYCERIN (NITROSTAT) 0.4 MG SL tablet Place 0.4 mg under the tongue every 5 (five) minutes as needed for chest pain.     . pantoprazole (PROTONIX) 40 MG tablet Take 40 mg by mouth daily.     . [DISCONTINUED] benazepril (LOTENSIN) 40 MG tablet Take 1 tablet (40 mg total) by mouth daily. 30 tablet 1  . [DISCONTINUED] potassium chloride (KLOR-CON) 10 MEQ CR tablet Take 2 tablets (20 mEq total) by mouth as  needed. For 5 days then stop. 10 tablet 0   No current facility-administered medications for this visit.    Allergies:  Review of patient's allergies indicates no known allergies.   Social History: The patient  reports that he has never smoked. He has never used smokeless tobacco. He reports that he drinks alcohol. He reports that he does not use illicit drugs.   ROS:  Please see the history of present illness. Otherwise, complete review of systems is positive for none.  All other systems are reviewed and negative.   Physical Exam: VS:  BP 142/58 mmHg  Pulse 63  Ht  (1.778 m)  Wt 186  lb (84.369 kg)  BMI 26.69 kg/m2  SpO2 98%, BMI Body mass index is 26.69 kg/(m^2).  Wt Readings from Last 3 Encounters:  10/16/14 186 lb (84.369 kg)  04/16/14 182 lb 12.8 oz (82.918 kg)  10/04/13 181 lb (82.101 kg)     Appears comfortable at rest.  HEENT: Conjunctiva and lids normal, oropharynx is clear.  Neck: Supple, soft carotid bruits as noted previously.  Lungs: Clear without labored breathing, decreased at the bases.  Thorax: Well healed midline sternal incision.  Cardiac: Regular rate and rhythm, very soft systolic murmur at the right base. No S3 gallop.  Abdomen: Soft, nontender, bowel sounds present  Extremities: Trace edema, distal pulses 2+.  Skin: Warm and dry. Musculoskeletal: No kyphosis. Neuropsychiatric: Alert and oriented x3, affect appropriate.   ECG: ECG is not ordered today.  Other Studies Reviewed Today:  Echocardiogram 10/18/2013: Study Conclusions  - Left ventricle: The cavity size was normal. Wall thickness was normal. Systolic function was normal. The estimated ejection fraction was in the range of 60% to 65%. There is diastolic dysfunction, indeterminant grade. E/e&' 15 consistent with elevated LA pressure. Wall motion was normal; there were no regional wall motion abnormalities. - Aortic valve: Mean gradient (S): 13 mm Hg. Peak gradient (S): 27 mm Hg. VTI ratio of LVOT to aortic valve: 0.55. Valve area (VTI): 1.92 cm^2. Valve area (Vmax): 1.76 cm^2. Valve area (Vmean): 1.67 cm^2. - Mitral valve: Mildly calcified annulus. Mildly thickened leaflets . - Technically adequate study.  Assessment and Plan:  1. Multivessel CAD status post CABG in 2013. He remains clinically stable. Plan to continue medical therapy, exercise as tolerated.  2. Aortic valve disease status post bioprosthetic AVR in 2013 with concurrent CABG. Valve function stable by echocardiogram last year. No change on examination.  3. Essential hypertension,  systolic blood pressure in the 140s. No change made in current regimen.  4. Hyperlipidemia, on Lipitor. Due for follow-up lab work with Dr. Sherril Croon soon.  Current medicines were reviewed with the patient today.  Disposition: FU with me in 6 months.   Signed, Jonelle Sidle, MD, [facility] 10/16/2014 12:04 PM    [facility] at [facility] [address] , [place], [place] [address] Phone: [phone]; Fax: [phone]

## 2014-10-16 NOTE — Patient Instructions (Signed)
Your physician recommends that you continue on your current medications as directed. Please refer to the Current Medication list given to you today. Your physician recommends that you schedule a follow-up appointment in: 6 months. You will receive a reminder letter in the mail in about 4 months reminding you to call and schedule your appointment. If you don't receive this letter, please contact our office. 

## 2014-12-20 ENCOUNTER — Encounter (HOSPITAL_COMMUNITY): Payer: Self-pay

## 2014-12-20 ENCOUNTER — Emergency Department (HOSPITAL_COMMUNITY)
Admission: EM | Admit: 2014-12-20 | Discharge: 2014-12-20 | Disposition: A | Discharge status: Home / Self Care | Payer: Medicare Other | Attending: Emergency Medicine | Admitting: Emergency Medicine

## 2014-12-20 DIAGNOSIS — I2510 Atherosclerotic heart disease of native coronary artery without angina pectoris: Secondary | ICD-10-CM | POA: Diagnosis not present

## 2014-12-20 DIAGNOSIS — E782 Mixed hyperlipidemia: Secondary | ICD-10-CM | POA: Diagnosis not present

## 2014-12-20 DIAGNOSIS — Z951 Presence of aortocoronary bypass graft: Secondary | ICD-10-CM | POA: Insufficient documentation

## 2014-12-20 DIAGNOSIS — R61 Generalized hyperhidrosis: Secondary | ICD-10-CM | POA: Diagnosis not present

## 2014-12-20 DIAGNOSIS — R011 Cardiac murmur, unspecified: Secondary | ICD-10-CM | POA: Insufficient documentation

## 2014-12-20 DIAGNOSIS — K219 Gastro-esophageal reflux disease without esophagitis: Secondary | ICD-10-CM | POA: Diagnosis not present

## 2014-12-20 DIAGNOSIS — E119 Type 2 diabetes mellitus without complications: Secondary | ICD-10-CM | POA: Diagnosis not present

## 2014-12-20 DIAGNOSIS — R55 Syncope and collapse: Secondary | ICD-10-CM

## 2014-12-20 DIAGNOSIS — R110 Nausea: Secondary | ICD-10-CM | POA: Diagnosis not present

## 2014-12-20 DIAGNOSIS — I10 Essential (primary) hypertension: Secondary | ICD-10-CM | POA: Insufficient documentation

## 2014-12-20 DIAGNOSIS — Z79899 Other long term (current) drug therapy: Secondary | ICD-10-CM | POA: Diagnosis not present

## 2014-12-20 DIAGNOSIS — H538 Other visual disturbances: Secondary | ICD-10-CM | POA: Diagnosis not present

## 2014-12-20 DIAGNOSIS — R42 Dizziness and giddiness: Secondary | ICD-10-CM | POA: Insufficient documentation

## 2014-12-20 DIAGNOSIS — Z952 Presence of prosthetic heart valve: Secondary | ICD-10-CM | POA: Insufficient documentation

## 2014-12-20 LAB — BASIC METABOLIC PANEL
ANION GAP: 6 (ref 5–15)
BUN: 14 mg/dL (ref 6–20)
CALCIUM: 8.7 mg/dL — AB (ref 8.9–10.3)
CO2: 27 mmol/L (ref 22–32)
CREATININE: 1.06 mg/dL (ref 0.61–1.24)
Chloride: 101 mmol/L (ref 101–111)
GFR calc Af Amer: >60 mL/min (ref >60)
GFR calc non Af Amer: >60 mL/min (ref >60)
GLUCOSE: 137 mg/dL — AB (ref 65–99)
POTASSIUM: 5 mmol/L (ref 3.5–5.1)
SODIUM: 134 mmol/L — AB (ref 135–145)

## 2014-12-20 LAB — CBC
HEMATOCRIT: 36.9 % — AB (ref 39.0–52.0)
Hemoglobin: 12.3 g/dL — ABNORMAL LOW (ref 13.0–17.0)
MCH: 33.3 pg (ref 26.0–34.0)
MCHC: 33.3 g/dL (ref 30.0–36.0)
MCV: 100 fL (ref 78.0–100.0)
Platelets: 192 10*3/uL (ref 150–400)
RBC: 3.69 MIL/uL — AB (ref 4.22–5.81)
RDW: 11.8 % (ref 11.5–15.5)
WBC: 8.4 10*3/uL (ref 4.0–10.5)

## 2014-12-20 MED ORDER — SODIUM CHLORIDE 0.9 % IV BOLUS (SEPSIS)
1000.0000 mL | Freq: Once | INTRAVENOUS | Status: AC
  Administered 2014-12-20: 1000 mL via INTRAVENOUS

## 2014-12-20 NOTE — Discharge Instructions (Signed)

## 2014-12-20 NOTE — ED Notes (Signed)
Ambulated pt on the unit pt ambulated well pt states he felt some SOB but no other complaints noted at this time

## 2014-12-20 NOTE — ED Notes (Signed)
Sandwich given per MD request.

## 2014-12-20 NOTE — ED Provider Notes (Signed)
CSN: [id number]     Arrival date & time 12/20/14  4098 History   First MD Initiated Contact with Patient 12/20/14 1000     Chief Complaint  Patient presents with  . Near Syncope    HPI [name] is a 81 year old male with PMH of CAD s/p CABG and Aortic Valve Replacement in 2013, HTN who presents with near syncope. He was sitting in the waiting room of the eye doctor, where his wife is a patient, when he began to feel lightheaded. He attempted to go to the bathroom, but became diaphoretic, warm all over, nauseous, dizzy, and had blurry vision. He did not fall or lose consciousness. He was helped by the clinic workers who had a difficult time getting a blood pressure. He says EMS came and reported a normal blood pressure at the clinic before coming to the ED. CBG was 152. He denies any obvious bleeding and is not on a blood thinner/anticoagulant other than aspirin. He denied any focal weakness, seizure-like activity, chest pain, SOB, palpitations, slurred speech, confusion, or emesis. He says he took all of his medication this morning, but had a lighter breakfast than usual.   Past Medical History  Diagnosis Date  . Coronary atherosclerosis of native coronary artery     DES LAD 10/07  . Carotid artery disease (HCC)     Mild  . Essential hypertension, benign   . Mixed hyperlipidemia   . Aortic stenosis   . Aortic regurgitation   . Type 2 diabetes mellitus (HCC)   . Peptic ulcer disease   . Blood transfusion   . GERD (gastroesophageal reflux disease)    Past Surgical History  Procedure Laterality Date  . Appendectomy    . Aortic valve replacement  03/12/2011    Procedure: AORTIC VALVE REPLACEMENT (AVR);  Surgeon: Kathlee Nations Suann Larry, MD;  Location: [facility] OR;  Service: Open Heart Surgery;  Laterality: N/A;  Aortic Valve Replacement   . Coronary artery bypass graft  03/12/2011    Procedure: CORONARY ARTERY BYPASS GRAFTING (CABG);  Surgeon: Kathlee Nations Suann Larry, MD;  Location: [facility] OR;   Service: Open Heart Surgery;  Laterality: N/A;  Coronary Artery Bypass Graft on pump times  two utilizing left internal mammary artery and right greater saphenous vein, harvested endoscopically.  Transesophageal echocardiogram   . Yag laser application Left 01/15/2014    Procedure: YAG LASER APPLICATION;  Surgeon: Susa Simmonds, MD;  Location: [facility] ORS;  Service: Ophthalmology;  Laterality: Left;  . Yag laser application Right 02/12/2014    Procedure: YAG LASER APPLICATION;  Surgeon: Susa Simmonds, MD;  Location: [facility] ORS;  Service: Ophthalmology;  Laterality: Right;   Family History  Problem Relation Age of Onset  . Hypertension     Social History  Substance Use Topics  . Smoking status: Never Smoker   . Smokeless tobacco: Never Used  . Alcohol Use: 0.0 oz/week    0 Standard drinks or equivalent per week     Comment: Occasional wine    Review of Systems  Constitutional: Positive for diaphoresis. Negative for fever and chills.  Eyes:       Blurry vision  Respiratory: Negative for cough, chest tightness, shortness of breath and wheezing.   Cardiovascular: Negative for chest pain and palpitations.  Gastrointestinal: Positive for nausea. Negative for vomiting, abdominal pain, diarrhea, constipation and blood in stool.  Genitourinary: Negative for dysuria and hematuria.  Musculoskeletal: Negative for myalgias.  Neurological: Positive for dizziness and  light-headedness. Negative for tremors, seizures, syncope, facial asymmetry, speech difficulty, weakness, numbness and headaches.  Psychiatric/Behavioral: Negative for confusion.      Allergies  Review of patient's allergies indicates no known allergies.  Home Medications   Prior to Admission medications   Medication Sig Start Date End Date Taking? Authorizing Provider  amLODipine-benazepril (LOTREL) 5-40 MG capsule Take 1 capsule by mouth daily.   Yes Historical Provider, MD  atorvastatin (LIPITOR) 20 MG tablet Take 1 tablet (20 mg  total) by mouth daily. 08/21/11  Yes Jonelle Sidle, MD  metoprolol tartrate (LOPRESSOR) 25 MG tablet TAKE 1 TABLET THREE TIMES A DAY 10/04/13  Yes Jonelle Sidle, MD  Multiple Vitamin (MULTIVITAMIN) tablet Take 1 tablet by mouth daily.     Yes Historical Provider, MD  nitroGLYCERIN (NITROSTAT) 0.4 MG SL tablet Place 0.4 mg under the tongue every 5 (five) minutes as needed for chest pain.    Yes Historical Provider, MD  pantoprazole (PROTONIX) 40 MG tablet Take 40 mg by mouth daily.  01/16/12  Yes Historical Provider, MD   BP 169/84 mmHg  Pulse 71  Temp(Src) 97.5 F (36.4 C) (Oral)  Resp 14  SpO2 98% Physical Exam  Constitutional: He is oriented to person, place, and time. He appears well-developed and well-nourished. No distress.  HENT:  Head: Normocephalic and atraumatic.  Eyes: EOM are normal. Pupils are equal, round, and reactive to light.  Cardiovascular: Regular rhythm and intact distal pulses.   Slightly brady, systolic murmur heard best at RUS border  Pulmonary/Chest: Effort normal. He has no wheezes. He has no rales.  Abdominal: Soft. Bowel sounds are normal. There is no tenderness.  Musculoskeletal: He exhibits no edema or tenderness.  Neurological: He is alert and oriented to person, place, and time. He displays normal reflexes. No cranial nerve deficit. Coordination normal.  Skin: Skin is warm. He is not diaphoretic. No pallor.    ED Course  Procedures (including critical care time) Labs Review Labs Reviewed  BASIC METABOLIC PANEL - Abnormal; Notable for the following:    Sodium 134 (*)    Glucose, Bld 137 (*)    Calcium 8.7 (*)    All other components within normal limits  CBC - Abnormal; Notable for the following:    RBC 3.69 (*)    Hemoglobin 12.3 (*)    HCT 36.9 (*)    All other components within normal limits  CBG MONITORING, ED    Imaging Review No results found. I have personally reviewed and evaluated these images and lab results as part of my medical  decision-making.   EKG Interpretation   Date/Time:  Thursday December 20 2014 09:56:49 EST Ventricular Rate:  54 PR Interval:  250 QRS Duration: 97 QT Interval:  444 QTC Calculation: 421 R Axis:   26 Text Interpretation:  Sinus bradycardia Prolonged PR interval Anteroseptal  infarct, old changes noted compared to 2013 Confirmed by GOLDSTON  MD,  SCOTT ([id number]) on 12/20/2014 10:03:03 AM      MDM   Final diagnoses:  Near syncope   [name] is a 81 year old male with PMH of CAD s/p CABG and Aortic Valve Replacement in 2013, HTN who presents with near syncope with symptoms of diaphoresis, dizziness, blurry vision, and nausea without fall or LOC. Differentials include vasovagal near-syncope, arrhythmia, or hypovolemia 2/2 decreased po intake. No arrhythmia noted on EKG, physical exam, or cardiac monitoring. BPs stable at 130s/140s systolic with slight bradycardia which improved after 1L NS bolus.  His symptoms were likely due to decreased oral intake this morning. Patient tolerated food and ambulated well afterwards, feeling at his baseline health. He is counseled on return precautions and will have his wife drive him home.      Darreld McleanVishal Drucella Karbowski, MD 12/20/14 1421  Pricilla LovelessScott Goldston, MD 12/23/14 [phone]

## 2014-12-20 NOTE — ED Notes (Signed)
PT ambulated with baseline gait; VSS; A&Ox3; no signs of distress; respirations even and unlabored; skin warm and dry; no questions upon discharge.  

## 2014-12-20 NOTE — ED Notes (Signed)
Pt. BIB EMS for complaint of near syncope in waiting room of eye dr. Today. Pt. Had no loss of consciousness. Pt. AxO x4, denies CP/SOB. Pt. Has nausea/diaphoresis/pale/general malaise on onset. CBG 152. Pt. States he did not vomit, no diarrhea.

## 2014-12-24 ENCOUNTER — Other Ambulatory Visit: Payer: Self-pay | Admitting: Cardiology

## 2015-02-04 DIAGNOSIS — R1013 Epigastric pain: Secondary | ICD-10-CM | POA: Diagnosis not present

## 2015-02-21 DIAGNOSIS — H538 Other visual disturbances: Secondary | ICD-10-CM | POA: Diagnosis not present

## 2015-02-21 DIAGNOSIS — H353131 Nonexudative age-related macular degeneration, bilateral, early dry stage: Secondary | ICD-10-CM | POA: Diagnosis not present

## 2015-04-01 DIAGNOSIS — E7800 Pure hypercholesterolemia, unspecified: Secondary | ICD-10-CM | POA: Diagnosis not present

## 2015-04-01 DIAGNOSIS — I10 Essential (primary) hypertension: Secondary | ICD-10-CM | POA: Diagnosis not present

## 2015-04-19 DIAGNOSIS — I10 Essential (primary) hypertension: Secondary | ICD-10-CM | POA: Diagnosis not present

## 2015-04-19 DIAGNOSIS — E7800 Pure hypercholesterolemia, unspecified: Secondary | ICD-10-CM | POA: Diagnosis not present

## 2015-04-25 ENCOUNTER — Ambulatory Visit (INDEPENDENT_AMBULATORY_CARE_PROVIDER_SITE_OTHER): Payer: Medicare Other | Admitting: Cardiology

## 2015-04-25 ENCOUNTER — Encounter: Payer: Self-pay | Admitting: Cardiology

## 2015-04-25 VITALS — BP 120/54 | HR 67 | Ht 70.0 in | Wt 180.0 lb

## 2015-04-25 DIAGNOSIS — E782 Mixed hyperlipidemia: Secondary | ICD-10-CM

## 2015-04-25 DIAGNOSIS — Z954 Presence of other heart-valve replacement: Secondary | ICD-10-CM

## 2015-04-25 DIAGNOSIS — Z953 Presence of xenogenic heart valve: Secondary | ICD-10-CM

## 2015-04-25 DIAGNOSIS — I2510 Atherosclerotic heart disease of native coronary artery without angina pectoris: Secondary | ICD-10-CM

## 2015-04-25 DIAGNOSIS — I10 Essential (primary) hypertension: Secondary | ICD-10-CM | POA: Diagnosis not present

## 2015-04-25 NOTE — Progress Notes (Signed)
Cardiology Office Note  Date: 04/25/2015   ID: Edward Pena, DOB 1926-04-02, MRN [id number]  PCP: Ignatius Specking., MD  Primary Cardiologist: Nona Dell, MD   Chief Complaint  Patient presents with  . Coronary Artery Disease  . Status post AVR    History of Present Illness: Edward Pena is an 82 y.o. male last seen in October 2016. He presents for a routine follow-up visit. Since last assessment he does not report having any significant angina symptoms. He continues to exercise 3-5 days a week at a local gym. He reports NYHA class II dyspnea.  We went over his medications which are outlined below. Cardiac regimen includes aspirin, Lipitor, Lopressor, and as needed nitroglycerin.  Last echocardiogram was in October 2015. LVEF 60-65% and stable aortic prosthetic function.  He continues to follow with Dr. Sherril Croon, will have a physical with repeat lab work in November of this year.  Past Medical History  Diagnosis Date  . Coronary atherosclerosis of native coronary artery     DES LAD 10/07  . Carotid artery disease (HCC)     Mild  . Essential hypertension, benign   . Mixed hyperlipidemia   . Aortic stenosis   . Aortic regurgitation   . Type 2 diabetes mellitus (HCC)   . Peptic ulcer disease   . Blood transfusion   . GERD (gastroesophageal reflux disease)     Past Surgical History  Procedure Laterality Date  . Appendectomy    . Aortic valve replacement  03/12/2011    Procedure: AORTIC VALVE REPLACEMENT (AVR);  Surgeon: Kathlee Nations Suann Larry, MD;  Location: [facility] OR;  Service: Open Heart Surgery;  Laterality: N/A;  Aortic Valve Replacement   . Coronary artery bypass graft  03/12/2011    Procedure: CORONARY ARTERY BYPASS GRAFTING (CABG);  Surgeon: Kathlee Nations Suann Larry, MD;  Location: [facility] OR;  Service: Open Heart Surgery;  Laterality: N/A;  Coronary Artery Bypass Graft on pump times  two utilizing left internal mammary artery and right greater saphenous vein, harvested  endoscopically.  Transesophageal echocardiogram   . Yag laser application Left 01/15/2014    Procedure: YAG LASER APPLICATION;  Surgeon: Susa Simmonds, MD;  Location: [facility];  Service: Ophthalmology;  Laterality: Left;  . Yag laser application Right 02/12/2014    Procedure: YAG LASER APPLICATION;  Surgeon: Susa Simmonds, MD;  Location: [facility];  Service: Ophthalmology;  Laterality: Right;    Current Outpatient Prescriptions  Medication Sig Dispense Refill  . amLODipine-benazepril (LOTREL) 5-40 MG capsule Take 1 capsule by mouth daily.    [name] atorvastatin (LIPITOR) 20 MG tablet Take 1 tablet (20 mg total) by mouth daily. 30 tablet 6  . metoprolol tartrate (LOPRESSOR) 25 MG tablet TAKE 1 TABLET THREE TIMES A DAY 270 tablet 2  . Multiple Vitamin (MULTIVITAMIN) tablet Take 1 tablet by mouth daily.      . nitroGLYCERIN (NITROSTAT) 0.4 MG SL tablet Place 0.4 mg under the tongue every 5 (five) minutes as needed for chest pain.     . pantoprazole (PROTONIX) 40 MG tablet Take 40 mg by mouth daily.     . [DISCONTINUED] benazepril (LOTENSIN) 40 MG tablet Take 1 tablet (40 mg total) by mouth daily. 30 tablet 1  . [DISCONTINUED] potassium chloride (KLOR-CON) 10 MEQ CR tablet Take 2 tablets (20 mEq total) by mouth as needed. For 5 days then stop. 10 tablet 0   No current facility-administered medications for this visit.   Allergies:  Review of  patient's allergies indicates no known allergies.   Social History: The patient  reports that he has never smoked. He has never used smokeless tobacco. He reports that he drinks alcohol. He reports that he does not use illicit drugs.   ROS:  Please see the history of present illness. Otherwise, complete review of systems is positive for decreased hearing.  All other systems are reviewed and negative.   Physical Exam: VS:  BP 120/54 mmHg  Pulse 67  Ht 5\' 10"  (1.778 m)  Wt 180 lb (81.647 kg)  BMI 25.83 kg/m2  SpO2 97%, BMI Body mass index is 25.83  kg/(m^2).  Wt Readings from Last 3 Encounters:  04/25/15 180 lb (81.647 kg)  10/16/14 186 lb (84.369 kg)  04/16/14 182 lb 12.8 oz (82.918 kg)    Appears comfortable at rest.  HEENT: Conjunctiva and lids normal, oropharynx is clear.  Neck: Supple, soft carotid bruits as noted previously.  Lungs: Clear without labored breathing, decreased at the bases.  Cardiac: Regular rate and rhythm, 2/6 murmur at the right base, somewhat lower at apex. No S3 gallop.  Abdomen: Soft, nontender, bowel sounds present  Extremities: Trace edema, distal pulses 2+.  Skin: Warm and dry. Musculoskeletal: No kyphosis. Neuropsychiatric: Alert and oriented x3, affect appropriate.  ECG: I personally reviewed the prior tracing from 12/20/2014 which showed sinus rhythm with prolonged PR interval and nonspecific ST changes.  Recent Labwork: 12/20/2014: BUN 14; Creatinine, Ser 1.06; Hemoglobin 12.3*; Platelets 192; Potassium 5.0; Sodium 134*   Other Studies Reviewed Today:  Echocardiogram 10/18/2013: Study Conclusions  - Left ventricle: The cavity size was normal. Wall thickness was normal. Systolic function was normal. The estimated ejection fraction was in the range of 60% to 65%. There is diastolic dysfunction, indeterminant grade. E/e&' 15 consistent with elevated LA pressure. Wall motion was normal; there were no regional wall motion abnormalities. - Aortic valve: Mean gradient (S): 13 mm Hg. Peak gradient (S): 27 mm Hg. VTI ratio of LVOT to aortic valve: 0.55. Valve area (VTI): 1.92 cm^2. Valve area (Vmax): 1.76 cm^2. Valve area (Vmean): 1.67 cm^2. - Mitral valve: Mildly calcified annulus. Mildly thickened leaflets. - Technically adequate study.  Assessment and Plan:  1. Symptomatically stable CAD status post CABG in 2013. He continues to exercise regularly and reports no significant angina on medical therapy. We will continue observation.  2. Aortic stenosis status post  bioprosthetic aortic valve replacement at the time of CABG in 2013. Valve function stable by echocardiogram in 2015. We will continue to follow examination for now.  3. Essential hypertension, blood pressure is well controlled today.  4. Hyperlipidemia, continues on statin therapy. Will have repeat lab work in November of this year with Dr. Sherril CroonVyas at physical.  Current medicines were reviewed with the patient today.   Disposition: FU with me in 6 months.   Signed, Jonelle SidleSamuel G. Ewa Hipp, MD, [facility]FACC 04/25/2015 1:16 PM    [facility] [address], [place], [place] [address] Phone: [phone]) [phone]; Fax: [phone]

## 2015-04-25 NOTE — Patient Instructions (Signed)
Your physician recommends that you continue on your current medications as directed. Please refer to the Current Medication list given to you today. Your physician recommends that you schedule a follow-up appointment in: 6 months. You will receive a reminder letter in the mail in about 4 months reminding you to call and schedule your appointment. If you don't receive this letter, please contact our office. 

## 2015-05-15 DIAGNOSIS — E7800 Pure hypercholesterolemia, unspecified: Secondary | ICD-10-CM | POA: Diagnosis not present

## 2015-05-15 DIAGNOSIS — I10 Essential (primary) hypertension: Secondary | ICD-10-CM | POA: Diagnosis not present

## 2015-07-10 DIAGNOSIS — E7800 Pure hypercholesterolemia, unspecified: Secondary | ICD-10-CM | POA: Diagnosis not present

## 2015-07-10 DIAGNOSIS — I10 Essential (primary) hypertension: Secondary | ICD-10-CM | POA: Diagnosis not present

## 2015-07-31 ENCOUNTER — Telehealth: Payer: Self-pay | Admitting: *Deleted

## 2015-07-31 ENCOUNTER — Encounter: Payer: Self-pay | Admitting: *Deleted

## 2015-07-31 NOTE — Telephone Encounter (Signed)
Dr. Andrey CampanileWilson office aware and requested letter faxed [facility]

## 2015-07-31 NOTE — Telephone Encounter (Signed)
Dr. Andrey CampanileWilson (Dentist) is scheduling pt for routine cleaning - does pt need prophylaxis prior to cleaning?

## 2015-07-31 NOTE — Telephone Encounter (Signed)
Yes, he has a bioprosthetic AVR in place. With no drug allergies would use amoxicillin 2000 mg, one time dose 30 minutes prior to procedure.

## 2015-08-26 DIAGNOSIS — E7800 Pure hypercholesterolemia, unspecified: Secondary | ICD-10-CM | POA: Diagnosis not present

## 2015-08-26 DIAGNOSIS — I10 Essential (primary) hypertension: Secondary | ICD-10-CM | POA: Diagnosis not present

## 2015-09-05 DIAGNOSIS — Z23 Encounter for immunization: Secondary | ICD-10-CM | POA: Diagnosis not present

## 2015-09-20 ENCOUNTER — Other Ambulatory Visit: Payer: Self-pay | Admitting: Cardiology

## 2015-09-25 DIAGNOSIS — I270 Primary pulmonary hypertension: Secondary | ICD-10-CM | POA: Diagnosis not present

## 2015-09-25 DIAGNOSIS — J329 Chronic sinusitis, unspecified: Secondary | ICD-10-CM | POA: Diagnosis not present

## 2015-09-25 DIAGNOSIS — E7800 Pure hypercholesterolemia, unspecified: Secondary | ICD-10-CM | POA: Diagnosis not present

## 2015-10-11 DIAGNOSIS — I10 Essential (primary) hypertension: Secondary | ICD-10-CM | POA: Diagnosis not present

## 2015-10-11 DIAGNOSIS — E7800 Pure hypercholesterolemia, unspecified: Secondary | ICD-10-CM | POA: Diagnosis not present

## 2015-10-14 DIAGNOSIS — I10 Essential (primary) hypertension: Secondary | ICD-10-CM | POA: Diagnosis not present

## 2015-10-14 DIAGNOSIS — R1013 Epigastric pain: Secondary | ICD-10-CM | POA: Diagnosis not present

## 2015-10-14 DIAGNOSIS — Z6827 Body mass index (BMI) 27.0-27.9, adult: Secondary | ICD-10-CM | POA: Diagnosis not present

## 2015-10-14 DIAGNOSIS — Z299 Encounter for prophylactic measures, unspecified: Secondary | ICD-10-CM | POA: Diagnosis not present

## 2015-10-14 DIAGNOSIS — Z713 Dietary counseling and surveillance: Secondary | ICD-10-CM | POA: Diagnosis not present

## 2015-10-21 ENCOUNTER — Encounter (INDEPENDENT_AMBULATORY_CARE_PROVIDER_SITE_OTHER): Payer: Self-pay | Admitting: Internal Medicine

## 2015-10-22 ENCOUNTER — Encounter (INDEPENDENT_AMBULATORY_CARE_PROVIDER_SITE_OTHER): Payer: Self-pay

## 2015-10-22 ENCOUNTER — Ambulatory Visit (INDEPENDENT_AMBULATORY_CARE_PROVIDER_SITE_OTHER): Payer: Medicare Other | Admitting: Internal Medicine

## 2015-10-22 ENCOUNTER — Encounter (INDEPENDENT_AMBULATORY_CARE_PROVIDER_SITE_OTHER): Payer: Self-pay | Admitting: *Deleted

## 2015-10-22 ENCOUNTER — Other Ambulatory Visit (INDEPENDENT_AMBULATORY_CARE_PROVIDER_SITE_OTHER): Payer: Self-pay | Admitting: Internal Medicine

## 2015-10-22 ENCOUNTER — Encounter (INDEPENDENT_AMBULATORY_CARE_PROVIDER_SITE_OTHER): Payer: Self-pay | Admitting: Internal Medicine

## 2015-10-22 DIAGNOSIS — K219 Gastro-esophageal reflux disease without esophagitis: Secondary | ICD-10-CM | POA: Diagnosis not present

## 2015-10-22 DIAGNOSIS — K2100 Gastro-esophageal reflux disease with esophagitis, without bleeding: Secondary | ICD-10-CM

## 2015-10-22 DIAGNOSIS — I2510 Atherosclerotic heart disease of native coronary artery without angina pectoris: Secondary | ICD-10-CM

## 2015-10-22 NOTE — Progress Notes (Signed)
Subjective:    Patient ID: Edward Pena, male    DOB: 25-Mar-1926, 82 y.o.   MRN: [id number]  HPI Referred by Dr. Sherril Croon for weight loss/GERD.  Requesting an EGD.  Per records patient has had diarrhea, nausea and weight loss for weeks.  Has been evaluated by Dr. Teena Dunk in the past. In 2015 underwent and EGD by Dr. Teena Dunk for dysphagia. Finding: Hiatal hernia. Mild gastritis in the antrum, esophageal ring.  In 2013 underwent an EGD for melena. Found to have hiatal hernia, a stricture at the distal esophagus, and a duodenal ulcer in the bulb of the duodenum. He was positive for H pylori. He was treated with Biaxin and Amoxicillin x 10 days He tells me he has been having trouble with GERD. He has epigastric pain. He has been hurting for about a month. He says it is better now since starting the Prevacid. Has been on Prevacid for about two weeks. He denies any heart burn. He says he has been losing weight (5 pounds). He has a BM daily. No melena or BRRB.  Takes ASA 81mg  2-3 times a week.  Occasionally has dysphagia.  Weight 10/22/2015 175. Weight 10/14/2015 173 Ht 67 Weight 12/24/2014 180   05/16/2013 EGD: dysphagia: Dr. Teena Dunk Findings: hiatal hernia, Mild gastritis in the antrum, esophageal ring.   Review of Systems Past Medical History:  Diagnosis Date  . Aortic regurgitation   . Aortic stenosis   . Blood transfusion   . Carotid artery disease (HCC)    Mild  . Coronary atherosclerosis of native coronary artery    DES LAD 10/07  . Essential hypertension, benign   . GERD (gastroesophageal reflux disease)   . Mixed hyperlipidemia   . Peptic ulcer disease   . Type 2 diabetes mellitus (HCC)     Past Surgical History:  Procedure Laterality Date  . AORTIC VALVE REPLACEMENT  03/12/2011   Procedure: AORTIC VALVE REPLACEMENT (AVR);  Surgeon: Kathlee Nations Suann Larry, MD;  Location: [facility] OR;  Service: Open Heart Surgery;  Laterality: N/A;  Aortic Valve Replacement   . APPENDECTOMY    . CORONARY  ARTERY BYPASS GRAFT  03/12/2011   Procedure: CORONARY ARTERY BYPASS GRAFTING (CABG);  Surgeon: Kathlee Nations Suann Larry, MD;  Location: [facility] OR;  Service: Open Heart Surgery;  Laterality: N/A;  Coronary Artery Bypass Graft on pump times  two utilizing left internal mammary artery and right greater saphenous vein, harvested endoscopically.  Transesophageal echocardiogram   . YAG LASER APPLICATION Left 01/15/2014   Procedure: YAG LASER APPLICATION;  Surgeon: Susa Simmonds, MD;  Location: [facility];  Service: Ophthalmology;  Laterality: Left;  . YAG LASER APPLICATION Right 02/12/2014   Procedure: YAG LASER APPLICATION;  Surgeon: Susa Simmonds, MD;  Location: [facility];  Service: Ophthalmology;  Laterality: Right;    No Known Allergies  Current Outpatient Prescriptions on File Prior to Visit  Medication Sig Dispense Refill  . amLODipine-benazepril (LOTREL) 5-40 MG capsule Take 1 capsule by mouth daily.    [name] atorvastatin (LIPITOR) 20 MG tablet Take 1 tablet (20 mg total) by mouth daily. 30 tablet 6  . metoprolol tartrate (LOPRESSOR) 25 MG tablet TAKE 1 TABLET THREE TIMES A DAY 270 tablet 2  . Multiple Vitamin (MULTIVITAMIN) tablet Take 1 tablet by mouth daily.      . nitroGLYCERIN (NITROSTAT) 0.4 MG SL tablet Place 0.4 mg under the tongue every 5 (five) minutes as needed for chest pain.     . [DISCONTINUED] benazepril (  LOTENSIN) 40 MG tablet Take 1 tablet (40 mg total) by mouth daily. 30 tablet 1  . [DISCONTINUED] potassium chloride (KLOR-CON) 10 MEQ CR tablet Take 2 tablets (20 mEq total) by mouth as needed. For 5 days then stop. 10 tablet 0   No current facility-administered medications on file prior to visit.        Objective:   Physical Exam Blood pressure (!) 122/40, pulse 68, temperature 98.1 F (36.7 C), height 5\' 7"  (1.702 m), weight 175 lb 3.2 oz (79.5 kg). Alert and oriented. Skin warm and dry. Oral mucosa is moist.   . Sclera anicteric, conjunctivae is pink. Thyroid not enlarged. No cervical  lymphadenopathy. Lungs clear. Heart regular rate and rhythm.  Abdomen is soft. Bowel sounds are positive. No hepatomegaly. No abdominal masses felt.Slight epigastric tenderness.  No edema to lower extremities.         Assessment & Plan:  GERD. PUD needs to be ruled out. EGD. The risks and benefits such as perforation, bleeding, and infection were reviewed with the patient and is agreeable.

## 2015-10-22 NOTE — Patient Instructions (Signed)
EGD. The risks and benefits such as perforation, bleeding, and infection were reviewed with the patient and is agreeable. 

## 2015-10-24 ENCOUNTER — Ambulatory Visit: Payer: Medicare Other | Admitting: Cardiology

## 2015-10-31 DIAGNOSIS — Z961 Presence of intraocular lens: Secondary | ICD-10-CM | POA: Diagnosis not present

## 2015-10-31 DIAGNOSIS — H538 Other visual disturbances: Secondary | ICD-10-CM | POA: Diagnosis not present

## 2015-11-15 NOTE — Progress Notes (Signed)
.    Cardiology Office Note  Date: 11/18/2015   ID: Edward Pena, DOB 1926-07-09, MRN [id number]  PCP: Ignatius Speckinghruv B Vyas, MD  Primary Cardiologist: Nona DellSamuel McDowell, MD   Chief Complaint  Patient presents with  . Coronary Artery Disease  . Status post AVR    History of Present Illness: Edward Pena is an 82 y.o. male last seen in April. He presents for a follow-up visit. Continues to do well, no angina symptoms. [facility].  I reviewed his medications, cardiac regimen is stable. He has not required any nitroglycerin.  Last echocardiogram was in October 2015. Bioprosthetic AVR was stable, mean valve gradient of 13 mmHg.  He has had recent GI assessment with plan for EGD. Reports worsening reflux symptoms.  He continues to follow with Dr. Sherril CroonVyas, will be having a physical and lab work later this month.  Past Medical History:  Diagnosis Date  . Aortic regurgitation   . Aortic stenosis   . Blood transfusion   . Carotid artery disease (HCC)    Mild  . Coronary atherosclerosis of native coronary artery    DES LAD 10/07  . Essential hypertension, benign   . GERD (gastroesophageal reflux disease)   . Mixed hyperlipidemia   . Peptic ulcer disease   . Type 2 diabetes mellitus (HCC)     Past Surgical History:  Procedure Laterality Date  . AORTIC VALVE REPLACEMENT  03/12/2011   Procedure: AORTIC VALVE REPLACEMENT (AVR);  Surgeon: Kathlee NationsPeter Van Suann Larryrigt III, MD;  [facility];  Service: Open Heart Surgery;  Laterality: N/A;  Aortic Valve Replacement   . APPENDECTOMY    . CORONARY ARTERY BYPASS GRAFT  03/12/2011   Procedure: CORONARY ARTERY BYPASS GRAFTING (CABG);  Surgeon: Kathlee NationsPeter Van Suann Larryrigt III, MD;  [facility];  Service: Open Heart Surgery;  Laterality: N/A;  Coronary Artery Bypass Graft on pump times  two utilizing left internal mammary artery and right greater saphenous vein, harvested endoscopically.  Transesophageal echocardiogram   . YAG LASER  APPLICATION Left 01/15/2014   Procedure: YAG LASER APPLICATION;  Surgeon: Susa Simmondsarroll F Haines, MD;  Location: [facility]S;  Service: Ophthalmology;  Laterality: Left;  . YAG LASER APPLICATION Right 02/12/2014   Procedure: YAG LASER APPLICATION;  Surgeon: Susa Simmondsarroll F Haines, MD;  Location: [facility];  Service: Ophthalmology;  Laterality: Right;    Current Outpatient Prescriptions  Medication Sig Dispense Refill  . amLODipine-benazepril (LOTREL) 5-40 MG capsule Take 1 capsule by mouth daily.    [name]. aspirin EC 81 MG tablet Take 81 mg by mouth. Takes about 2-3 times a week    . atorvastatin (LIPITOR) 20 MG tablet Take 1 tablet (20 mg total) by mouth daily. 30 tablet 6  . lansoprazole (PREVACID) 30 MG capsule Take 30 mg by mouth daily at 12 noon.    . metoprolol tartrate (LOPRESSOR) 25 MG tablet TAKE 1 TABLET THREE TIMES A DAY 270 tablet 2  . Multiple Vitamin (MULTIVITAMIN) tablet Take 1 tablet by mouth daily.      . nitroGLYCERIN (NITROSTAT) 0.4 MG SL tablet Place 0.4 mg under the tongue every 5 (five) minutes as needed for chest pain.     . Pantoprazole Sodium (PROTONIX PO) Take by mouth daily.     No current facility-administered medications for this visit.    Allergies:  Patient has no known allergies.   Social History: The patient  reports that he has never smoked. He has never  used smokeless tobacco. He reports that he drinks alcohol. He reports that he does not use drugs.   ROS:  Please see the history of present illness. Otherwise, complete review of systems is positive for decreased hearing.  All other systems are reviewed and negative.   Physical Exam: VS:  BP 110/62   Pulse 83   Ht 5\' 10"  (1.778 m)   Wt 184 lb (83.5 kg)   SpO2 95%   BMI 26.40 kg/m , BMI Body mass index is 26.4 kg/m.  Wt Readings from Last 3 Encounters:  11/18/15 184 lb (83.5 kg)  10/22/15 175 lb 3.2 oz (79.5 kg)  04/25/15 180 lb (81.6 kg)    Appears comfortable at rest.  HEENT: Conjunctiva and lids normal, oropharynx is  clear.  Neck: Supple, soft carotid bruits as noted previously.  Lungs: Clear without labored breathing, decreased at the bases.  Cardiac: Regular rate and rhythm, 2/6 murmur at the right base. No S3 gallop.  Abdomen: Soft, nontender, bowel sounds present  Extremities: Trace edema, distal pulses 2+.  Skin: Warm and dry. Musculoskeletal: No kyphosis. Neuropsychiatric: Alert and oriented x3, affect appropriate.  ECG: I personally reviewed the tracing from 12/20/2014 which showed sinus rhythm with prolonged PR interval and nonspecific ST changes.  Recent Labwork: 12/20/2014: BUN 14; Creatinine, Ser 1.06; Hemoglobin 12.3; Platelets 192; Potassium 5.0; Sodium 134   Other Studies Reviewed Today:  Echocardiogram 10/18/2013: Study Conclusions  - Left ventricle: The cavity size was normal. Wall thickness was normal. Systolic function was normal. The estimated ejection fraction was in the range of 60% to 65%. There is diastolic dysfunction, indeterminant grade. E/e&' 15 consistent with elevated LA pressure. Wall motion was normal; there were no regional wall motion abnormalities. - Aortic valve: Mean gradient (S): 13 mm Hg. Peak gradient (S): 27 mm Hg. VTI ratio of LVOT to aortic valve: 0.55. Valve area (VTI): 1.92 cm^2. Valve area (Vmax): 1.76 cm^2. Valve area (Vmean): 1.67 cm^2. - Mitral valve: Mildly calcified annulus. Mildly thickened leaflets. - Technically adequate study.  Assessment and Plan:  1. Aortic stenosis status post bioprosthetic AVR at the time of CABG in 2013. Exam is stable and he had a follow-up echocardiogram within the last 2 years. We will continue with observation.  2. Multivessel CAD status post CABG in 2013. He is doing well on medical therapy including aspirin and statin. [facility].  3. Hyperlipidemia, on Lipitor. Will be having follow-up lab work with Dr. Sherril CroonVyas later this month.  4. Essential  hypertension, blood pressure is well controlled today.  Current medicines were reviewed with the patient today.  Disposition: Follow-up in 6 months.  Signed, Jonelle SidleSamuel G. McDowell, MD, [facility] 11/18/2015 4:07 PM    [facility] [address], [place], [place] [address] Phone: [phone]; Fax: [phone]

## 2015-11-18 ENCOUNTER — Ambulatory Visit (INDEPENDENT_AMBULATORY_CARE_PROVIDER_SITE_OTHER): Payer: Medicare Other | Admitting: Cardiology

## 2015-11-18 ENCOUNTER — Encounter: Payer: Self-pay | Admitting: Cardiology

## 2015-11-18 VITALS — BP 110/62 | HR 83 | Ht 70.0 in | Wt 184.0 lb

## 2015-11-18 DIAGNOSIS — Z953 Presence of xenogenic heart valve: Secondary | ICD-10-CM

## 2015-11-18 DIAGNOSIS — E782 Mixed hyperlipidemia: Secondary | ICD-10-CM

## 2015-11-18 DIAGNOSIS — I2510 Atherosclerotic heart disease of native coronary artery without angina pectoris: Secondary | ICD-10-CM

## 2015-11-18 DIAGNOSIS — I10 Essential (primary) hypertension: Secondary | ICD-10-CM | POA: Diagnosis not present

## 2015-11-18 NOTE — Patient Instructions (Signed)

## 2015-11-20 DIAGNOSIS — I10 Essential (primary) hypertension: Secondary | ICD-10-CM | POA: Diagnosis not present

## 2015-11-20 DIAGNOSIS — E7800 Pure hypercholesterolemia, unspecified: Secondary | ICD-10-CM | POA: Diagnosis not present

## 2015-11-27 DIAGNOSIS — Z79899 Other long term (current) drug therapy: Secondary | ICD-10-CM | POA: Diagnosis not present

## 2015-11-27 DIAGNOSIS — Z299 Encounter for prophylactic measures, unspecified: Secondary | ICD-10-CM | POA: Diagnosis not present

## 2015-11-27 DIAGNOSIS — Z6828 Body mass index (BMI) 28.0-28.9, adult: Secondary | ICD-10-CM | POA: Diagnosis not present

## 2015-11-27 DIAGNOSIS — E7800 Pure hypercholesterolemia, unspecified: Secondary | ICD-10-CM | POA: Diagnosis not present

## 2015-11-27 DIAGNOSIS — Z125 Encounter for screening for malignant neoplasm of prostate: Secondary | ICD-10-CM | POA: Diagnosis not present

## 2015-11-27 DIAGNOSIS — Z7189 Other specified counseling: Secondary | ICD-10-CM | POA: Diagnosis not present

## 2015-11-27 DIAGNOSIS — Z1211 Encounter for screening for malignant neoplasm of colon: Secondary | ICD-10-CM | POA: Diagnosis not present

## 2015-11-27 DIAGNOSIS — Z1389 Encounter for screening for other disorder: Secondary | ICD-10-CM | POA: Diagnosis not present

## 2015-11-27 DIAGNOSIS — Z0000 Encounter for general adult medical examination without abnormal findings: Secondary | ICD-10-CM | POA: Diagnosis not present

## 2015-11-27 DIAGNOSIS — R5383 Other fatigue: Secondary | ICD-10-CM | POA: Diagnosis not present

## 2015-12-12 ENCOUNTER — Encounter (HOSPITAL_COMMUNITY): Payer: Self-pay | Admitting: *Deleted

## 2015-12-12 ENCOUNTER — Ambulatory Visit (HOSPITAL_COMMUNITY)
Admission: RE | Admit: 2015-12-12 | Discharge: 2015-12-12 | Disposition: A | Discharge status: Home / Self Care | Payer: Medicare Other | Source: Ambulatory Visit | Attending: Internal Medicine | Admitting: Internal Medicine

## 2015-12-12 ENCOUNTER — Encounter (HOSPITAL_COMMUNITY)
Admission: RE | Disposition: A | Discharge status: Home / Self Care | Payer: Self-pay | Source: Ambulatory Visit | Attending: Internal Medicine

## 2015-12-12 DIAGNOSIS — I10 Essential (primary) hypertension: Secondary | ICD-10-CM | POA: Insufficient documentation

## 2015-12-12 DIAGNOSIS — K449 Diaphragmatic hernia without obstruction or gangrene: Secondary | ICD-10-CM | POA: Diagnosis not present

## 2015-12-12 DIAGNOSIS — Z955 Presence of coronary angioplasty implant and graft: Secondary | ICD-10-CM | POA: Diagnosis not present

## 2015-12-12 DIAGNOSIS — K31819 Angiodysplasia of stomach and duodenum without bleeding: Secondary | ICD-10-CM | POA: Insufficient documentation

## 2015-12-12 DIAGNOSIS — R1314 Dysphagia, pharyngoesophageal phase: Secondary | ICD-10-CM | POA: Diagnosis not present

## 2015-12-12 DIAGNOSIS — K222 Esophageal obstruction: Secondary | ICD-10-CM | POA: Insufficient documentation

## 2015-12-12 DIAGNOSIS — I2510 Atherosclerotic heart disease of native coronary artery without angina pectoris: Secondary | ICD-10-CM | POA: Insufficient documentation

## 2015-12-12 DIAGNOSIS — Z952 Presence of prosthetic heart valve: Secondary | ICD-10-CM | POA: Insufficient documentation

## 2015-12-12 DIAGNOSIS — Z7982 Long term (current) use of aspirin: Secondary | ICD-10-CM | POA: Insufficient documentation

## 2015-12-12 DIAGNOSIS — E782 Mixed hyperlipidemia: Secondary | ICD-10-CM | POA: Insufficient documentation

## 2015-12-12 DIAGNOSIS — K219 Gastro-esophageal reflux disease without esophagitis: Secondary | ICD-10-CM | POA: Diagnosis not present

## 2015-12-12 DIAGNOSIS — K2100 Gastro-esophageal reflux disease with esophagitis, without bleeding: Secondary | ICD-10-CM

## 2015-12-12 HISTORY — PX: ESOPHAGOGASTRODUODENOSCOPY: SHX5428

## 2015-12-12 SURGERY — EGD (ESOPHAGOGASTRODUODENOSCOPY)
Anesthesia: Moderate Sedation

## 2015-12-12 MED ORDER — MEPERIDINE HCL 50 MG/ML IJ SOLN
INTRAMUSCULAR | Status: AC
  Filled 2015-12-12: qty 1

## 2015-12-12 MED ORDER — MEPERIDINE HCL 50 MG/ML IJ SOLN
INTRAMUSCULAR | Status: DC | PRN
  Administered 2015-12-12 (×2): 25 mg via INTRAVENOUS

## 2015-12-12 MED ORDER — MIDAZOLAM HCL 5 MG/5ML IJ SOLN
INTRAMUSCULAR | Status: DC | PRN
  Administered 2015-12-12: 1 mg via INTRAVENOUS
  Administered 2015-12-12: 2 mg via INTRAVENOUS

## 2015-12-12 MED ORDER — BUTAMBEN-TETRACAINE-BENZOCAINE 2-2-14 % EX AERO
INHALATION_SPRAY | CUTANEOUS | Status: DC | PRN
  Administered 2015-12-12: 2 via TOPICAL

## 2015-12-12 MED ORDER — SIMETHICONE 40 MG/0.6ML PO SUSP
ORAL | Status: DC | PRN
  Administered 2015-12-12: 2.5 mL

## 2015-12-12 MED ORDER — MIDAZOLAM HCL 5 MG/5ML IJ SOLN
INTRAMUSCULAR | Status: AC
  Filled 2015-12-12: qty 10

## 2015-12-12 MED ORDER — SODIUM CHLORIDE 0.9 % IV SOLN
INTRAVENOUS | Status: DC
  Administered 2015-12-12: 08:00:00 via INTRAVENOUS

## 2015-12-12 NOTE — Op Note (Signed)
[facility] Patient Name: Edward Pena Procedure Date: 12/12/2015 8:17 AM MRN: [id number] Date of Birth: August 28, 1926 Attending MD: Lionel DecemberNajeeb Brandy Zuba , MD CSN: [id number] Age: 82 Admit Type: Outpatient Procedure:                Upper GI endoscopy Indications:              Esophageal dysphagia, Gastro-esophageal reflux                            disease Providers:                Lionel DecemberNajeeb Betina Puckett, MD, Nena PolioLisa Moore, RN, Birder Robsonebra Houghton,                            Technician Referring MD:             Ignatius Speckinghruv B. Vyas, MD Medicines:                Cetacaine spray, Meperidine 50 mg IV, Midazolam 3                            mg IV Complications:            No immediate complications. Estimated Blood Loss:     Estimated blood loss was minimal. Procedure:                Pre-Anesthesia Assessment:                           - Prior to the procedure, a History and Physical                            was performed, and patient medications and                            allergies were reviewed. The patient's tolerance of                            previous anesthesia was also reviewed. The risks                            and benefits of the procedure and the sedation                            options and risks were discussed with the patient.                            All questions were answered, and informed consent                            was obtained. Prior Anticoagulants: The patient                            last took aspirin 3 days prior to the procedure.  ASA Grade Assessment: III - A patient with severe                            systemic disease. After reviewing the risks and                            benefits, the patient was deemed in satisfactory                            condition to undergo the procedure.                           After obtaining informed consent, the endoscope was                            passed under direct vision. Throughout the                      procedure, the patient's blood pressure, pulse, and                            oxygen saturations were monitored continuously. The                            [id number] ([id number]) scope was introduced through the                            mouth, and advanced to the second part of duodenum.                            The upper GI endoscopy was accomplished without                            difficulty. The patient tolerated the procedure                            well. Scope In: 8:31:57 AM Scope Out: 8:39:35 AM Total Procedure Duration: 0 hours 7 minutes 38 seconds  Findings:      The examined esophagus was normal.      A moderate Schatzki ring (acquired) was found at the gastroesophageal       junction. The scope was withdrawn. Dilation was performed with a Maloney       dilator with mild resistance at 54 Fr. The dilation site was examined       following endoscope reinsertion and showed complete resolution of       luminal narrowing.      The Z-line was regular and was found 41 cm from the incisors.      A 2 cm hiatal hernia was present.      Mild, localized gastric antral vascular ectasia was present in the       gastric antrum.      The exam of the stomach was otherwise normal.      The duodenal bulb and second portion of the duodenum were normal. Impression:               - Normal  esophagus.                           - Moderate Schatzki ring. Dilated.                           - Z-line regular, 41 cm from the incisors.                           - 2 cm hiatal hernia.                           - Gastric antral vascular ectasia.                           - Normal duodenal bulb and second portion of the                            duodenum.                           - No specimens collected. Moderate Sedation:      Moderate (conscious) sedation was administered by the endoscopy nurse       and supervised by the endoscopist. The following parameters were        monitored: oxygen saturation, heart rate, blood pressure, CO2       capnography and response to care. Total physician intraservice time was       11 minutes. Recommendation:           - Patient has a contact number available for                            emergencies. The signs and symptoms of potential                            delayed complications were discussed with the                            patient. Return to normal activities tomorrow.                            Written discharge instructions were provided to the                            patient.                           - Resume previous diet today.                           - Continue present medications.                           - Resume aspirin at prior dose in 3 days.                           - Repeat upper endoscopy PRN for  retreatment. Procedure Code(s):        --- Professional ---                           [id number], Esophagogastroduodenoscopy, flexible,                            transoral; diagnostic, including collection of                            specimen(s) by brushing or washing, when performed                            (separate procedure)                           43450, Dilation of esophagus, by unguided sound or                            bougie, single or multiple passes                           99152, Moderate sedation services provided by the                            same physician or other qualified health care                            professional performing the diagnostic or                            therapeutic service that the sedation supports,                            requiring the presence of an independent trained                            observer to assist in the monitoring of the                            patient's level of consciousness and physiological                            status; initial 15 minutes of intraservice time,                            patient age 80 years or  older Diagnosis Code(s):        --- Professional ---                           K22.2, Esophageal obstruction                           K44.9, Diaphragmatic hernia without obstruction or  gangrene                           K31.819, Angiodysplasia of stomach and duodenum                            without bleeding                           R13.14, Dysphagia, pharyngoesophageal phase                           K21.9, Gastro-esophageal reflux disease without                            esophagitis CPT copyright 2016 American Medical Association. All rights reserved. The codes documented in this report are preliminary and upon coder review may  be revised to meet current compliance requirements. Lionel December, MD Lionel December, MD 12/12/2015 8:49:57 AM This report has been signed electronically. Number of Addenda: 0

## 2015-12-12 NOTE — Discharge Instructions (Signed)
Resume aspirin on 12/14/2015. Resume other medications and diet as before. No driving for 24 hours. Please call office with progress report in one week. Esophagogastroduodenoscopy, Care After Introduction Refer to this sheet in the next few weeks. These instructions provide you with information about caring for yourself after your procedure. Your health care provider may also give you more specific instructions. Your treatment has been planned according to current medical practices, but problems sometimes occur. Call your health care provider if you have any problems or questions after your procedure. What can I expect after the procedure? After the procedure, it is common to have:  A sore throat.  Nausea.  Bloating.  Dizziness.  Fatigue. Follow these instructions at home:  Do not eat or drink anything until the numbing medicine (local anesthetic) has worn off and your gag reflex has returned. You will know that the local anesthetic has worn off when you can swallow comfortably.  Do not drive for 24 hours if you received a medicine to help you relax (sedative).  If your health care provider took a tissue sample for testing during the procedure, make sure to get your test results. This is your responsibility. Ask your health care provider or the department performing the test when your results will be ready.  Keep all follow-up visits as told by your health care provider. This is important. Contact a health care provider if:  You cannot stop coughing.  You are not urinating.  You are urinating less than usual. Get help right away if:  You have trouble swallowing.  You cannot eat or drink.  You have throat or chest pain that gets worse.  You are dizzy or light-headed.  You faint.  You have nausea or vomiting.  You have chills.  You have a fever.  You have severe abdominal pain.  You have black, tarry, or bloody stools. This information is not intended to replace  advice given to you by your health care provider. Make sure you discuss any questions you have with your health care provider. Document Released: 12/16/2011 Document Revised: 06/06/2015 Document Reviewed: 11/22/2014  2017 Elsevier

## 2015-12-12 NOTE — H&P (Signed)
Edward Pena is an 82 y.o. male.   Chief Complaint: Patient's here for EGD and ED. HPI: Patient is 82 year old Caucasian male who was chronic GERD who presents with intermittent solid food dysphagia. He points lower sternum area site of bolus obstruction. About a week and a half ago he was 20 drink water and it would not go down any passed out. He regained consciousness very quickly. He does not remember that he had shortness of breath or chest pain. He denies nausea vomiting abdominal pain melena or rectal bleeding. He states heartburn is well controlled with Prevacid. He has history of duodenal ulcer. He has been treated for H. pylori gastritis. Last [facility] in May 2015 [facility] but he did not undergo dilation.  Past Medical History:  Diagnosis Date  . Aortic regurgitation   . Aortic stenosis   . Blood transfusion   . Carotid artery disease (HCC)    Mild  . Coronary atherosclerosis of native coronary artery    DES LAD 10/07  . Essential hypertension, benign   . GERD (gastroesophageal reflux disease)   . Mixed hyperlipidemia   . Peptic ulcer disease     Past Surgical History:  Procedure Laterality Date  . AORTIC VALVE REPLACEMENT  03/12/2011   Procedure: AORTIC VALVE REPLACEMENT (AVR);  Surgeon: Kathlee NationsPeter Van Suann Larryrigt III, MD;  [facility];  Service: Open Heart Surgery;  Laterality: N/A;  Aortic Valve Replacement   . APPENDECTOMY    . CORONARY ARTERY BYPASS GRAFT  03/12/2011   Procedure: CORONARY ARTERY BYPASS GRAFTING (CABG);  Surgeon: Kathlee NationsPeter Van Suann Larryrigt III, MD;  [facility];  Service: Open Heart Surgery;  Laterality: N/A;  Coronary Artery Bypass Graft on pump times  two utilizing left internal mammary artery and right greater saphenous vein, harvested endoscopically.  Transesophageal echocardiogram   . YAG LASER APPLICATION Left 01/15/2014   Procedure: YAG LASER APPLICATION;  Surgeon: Susa Simmondsarroll F Haines, MD;  Location: [facility];  Service: Ophthalmology;  Laterality: Left;  .  YAG LASER APPLICATION Right 02/12/2014   Procedure: YAG LASER APPLICATION;  Surgeon: Susa Simmondsarroll F Haines, MD;  Location: [facility];  Service: Ophthalmology;  Laterality: Right;    Family History  Problem Relation Age of Onset  . Hypertension     Social History:  reports that he has never smoked. He has never used smokeless tobacco. He reports that he drinks alcohol. He reports that he does not use drugs.  Allergies: No Known Allergies  Medications Prior to Admission  Medication Sig Dispense Refill  . amLODipine-benazepril (LOTREL) 5-40 MG capsule Take 1 capsule by mouth daily.    [name]. aspirin EC 81 MG tablet Take 81 mg by mouth. Takes about 2-3 times a week    . atorvastatin (LIPITOR) 20 MG tablet Take 1 tablet (20 mg total) by mouth daily. 30 tablet 6  . lansoprazole (PREVACID) 30 MG capsule Take 30 mg by mouth daily at 12 noon.    . metoprolol tartrate (LOPRESSOR) 25 MG tablet TAKE 1 TABLET THREE TIMES A DAY 270 tablet 2  . Multiple Vitamin (MULTIVITAMIN) tablet Take 1 tablet by mouth daily.      . nitroGLYCERIN (NITROSTAT) 0.4 MG SL tablet Place 0.4 mg under the tongue every 5 (five) minutes as needed for chest pain.       No results found for this or any previous visit (from the past 48 hour(s)). No results found.  ROS  Blood pressure (!) 180/65, pulse 71, temperature  98.1 F (36.7 C), temperature source Oral, resp. rate 16, height 5\' 10"  (1.778 m), weight 180 lb (81.6 kg), SpO2 98 %. Physical Exam  Constitutional: He appears well-developed and well-nourished.  HENT:  Mouth/Throat: Oropharynx is clear and moist.  Eyes: Conjunctivae are normal. No scleral icterus.  Neck: No thyromegaly present.  Cardiovascular:  Regular rhythm normal S1 with prominent S2 and grade 2/6 systolic ejection at started at left sternal border.  Respiratory: Effort normal and breath sounds normal.  GI: Soft. He exhibits no distension and no mass. There is no tenderness.  Musculoskeletal: He exhibits no edema.   Lymphadenopathy:    He has no cervical adenopathy.  Neurological: He is alert.  Skin: Skin is warm and dry.     Assessment/Plan Dysphagia primarily to solids. Chronic GERD. EGD with ED.  Lionel DecemberNajeeb Rehman, MD 12/12/2015, 8:17 AM

## 2015-12-16 ENCOUNTER — Encounter (HOSPITAL_COMMUNITY): Payer: Self-pay | Admitting: Internal Medicine

## 2016-01-02 DIAGNOSIS — I10 Essential (primary) hypertension: Secondary | ICD-10-CM | POA: Diagnosis not present

## 2016-01-02 DIAGNOSIS — E7800 Pure hypercholesterolemia, unspecified: Secondary | ICD-10-CM | POA: Diagnosis not present

## 2016-01-28 DIAGNOSIS — Z0000 Encounter for general adult medical examination without abnormal findings: Secondary | ICD-10-CM | POA: Diagnosis not present

## 2016-01-28 DIAGNOSIS — E785 Hyperlipidemia, unspecified: Secondary | ICD-10-CM | POA: Diagnosis not present

## 2016-01-28 DIAGNOSIS — Z6828 Body mass index (BMI) 28.0-28.9, adult: Secondary | ICD-10-CM | POA: Diagnosis not present

## 2016-01-28 DIAGNOSIS — K08409 Partial loss of teeth, unspecified cause, unspecified class: Secondary | ICD-10-CM | POA: Diagnosis not present

## 2016-01-28 DIAGNOSIS — Z79899 Other long term (current) drug therapy: Secondary | ICD-10-CM | POA: Diagnosis not present

## 2016-01-28 DIAGNOSIS — I10 Essential (primary) hypertension: Secondary | ICD-10-CM | POA: Diagnosis not present

## 2016-01-28 DIAGNOSIS — K219 Gastro-esophageal reflux disease without esophagitis: Secondary | ICD-10-CM | POA: Diagnosis not present

## 2016-01-28 DIAGNOSIS — Z972 Presence of dental prosthetic device (complete) (partial): Secondary | ICD-10-CM | POA: Diagnosis not present

## 2016-03-26 DIAGNOSIS — E7800 Pure hypercholesterolemia, unspecified: Secondary | ICD-10-CM | POA: Diagnosis not present

## 2016-03-26 DIAGNOSIS — K219 Gastro-esophageal reflux disease without esophagitis: Secondary | ICD-10-CM | POA: Diagnosis not present

## 2016-03-26 DIAGNOSIS — Z713 Dietary counseling and surveillance: Secondary | ICD-10-CM | POA: Diagnosis not present

## 2016-03-26 DIAGNOSIS — I2510 Atherosclerotic heart disease of native coronary artery without angina pectoris: Secondary | ICD-10-CM | POA: Diagnosis not present

## 2016-03-26 DIAGNOSIS — I270 Primary pulmonary hypertension: Secondary | ICD-10-CM | POA: Diagnosis not present

## 2016-03-26 DIAGNOSIS — Z299 Encounter for prophylactic measures, unspecified: Secondary | ICD-10-CM | POA: Diagnosis not present

## 2016-03-26 DIAGNOSIS — Z6828 Body mass index (BMI) 28.0-28.9, adult: Secondary | ICD-10-CM | POA: Diagnosis not present

## 2016-05-18 ENCOUNTER — Encounter: Payer: Self-pay | Admitting: *Deleted

## 2016-05-19 ENCOUNTER — Ambulatory Visit (INDEPENDENT_AMBULATORY_CARE_PROVIDER_SITE_OTHER): Payer: Medicare HMO | Admitting: Cardiology

## 2016-05-19 ENCOUNTER — Encounter: Payer: Self-pay | Admitting: Cardiology

## 2016-05-19 VITALS — BP 161/60 | HR 52 | Ht 70.0 in | Wt 184.0 lb

## 2016-05-19 DIAGNOSIS — I10 Essential (primary) hypertension: Secondary | ICD-10-CM | POA: Diagnosis not present

## 2016-05-19 DIAGNOSIS — I2510 Atherosclerotic heart disease of native coronary artery without angina pectoris: Secondary | ICD-10-CM | POA: Diagnosis not present

## 2016-05-19 DIAGNOSIS — Z953 Presence of xenogenic heart valve: Secondary | ICD-10-CM

## 2016-05-19 DIAGNOSIS — E782 Mixed hyperlipidemia: Secondary | ICD-10-CM

## 2016-05-19 NOTE — Progress Notes (Signed)
Cardiology Office Note  Date: 05/19/2016   ID: PHUONG MOFFATT, DOB 06-27-1926, MRN [id number]  PCP: Ignatius Specking, MD  Primary Cardiologist: Nona Dell, MD   Chief Complaint  Patient presents with  . Coronary Artery Disease    History of Present Illness: Edward Pena is an 83 y.o. male last seen in November 2017. He presents for a routine follow-up visit. States that he has had no angina symptoms. He exercises at the [facility] 3 days a week. Walks on the treadmill for a mile and also uses other machines.  I reviewed his medications. Current cardiac regimen includes aspirin, Lotrel, Lipitor, Lopressor, and as needed nitroglycerin which he has not used.  I personally reviewed his ECG today which shows sinus bradycardia with prolonged PR interval.  Echocardiogram from 2015 is outlined below.  Past Medical History:  Diagnosis Date  . Aortic regurgitation   . Aortic stenosis   . Blood transfusion   . Carotid artery disease (HCC)    Mild  . Coronary atherosclerosis of native coronary artery    DES LAD 10/07  . Essential hypertension, benign   . GERD (gastroesophageal reflux disease)   . Mixed hyperlipidemia   . Peptic ulcer disease     Past Surgical History:  Procedure Laterality Date  . AORTIC VALVE REPLACEMENT  03/12/2011   Procedure: AORTIC VALVE REPLACEMENT (AVR);  Surgeon: Kathlee Nations Suann Larry, MD;  Location: [facility] OR;  Service: Open Heart Surgery;  Laterality: N/A;  Aortic Valve Replacement   . APPENDECTOMY    . CORONARY ARTERY BYPASS GRAFT  03/12/2011   Procedure: CORONARY ARTERY BYPASS GRAFTING (CABG);  Surgeon: Kathlee Nations Suann Larry, MD;  Location: [facility] OR;  Service: Open Heart Surgery;  Laterality: N/A;  Coronary Artery Bypass Graft on pump times  two utilizing left internal mammary artery and right greater saphenous vein, harvested endoscopically.  Transesophageal echocardiogram   . ESOPHAGOGASTRODUODENOSCOPY N/A 12/12/2015   Procedure: ESOPHAGOGASTRODUODENOSCOPY (EGD);   Surgeon: Malissa Hippo, MD;  [facility];  Service: Endoscopy;  Laterality: N/A;  255  . YAG LASER APPLICATION Left 01/15/2014   Procedure: YAG LASER APPLICATION;  Surgeon: Susa Simmonds, MD;  [facility] ORS;  Service: Ophthalmology;  Laterality: Left;  . YAG LASER APPLICATION Right 02/12/2014   Procedure: YAG LASER APPLICATION;  Surgeon: Susa Simmonds, MD;  [facility]: [facility];  Service: Ophthalmology;  Laterality: Right;    Current Outpatient Prescriptions  Medication Sig Dispense Refill  . amLODipine-benazepril (LOTREL) 5-40 MG capsule Take 1 capsule by mouth daily.    [name] aspirin EC 81 MG tablet Take 1 tablet (81 mg total) by mouth daily. Takes about 2-3 times a week    . atorvastatin (LIPITOR) 20 MG tablet Take 1 tablet (20 mg total) by mouth daily. 30 tablet 6  . lansoprazole (PREVACID) 30 MG capsule Take 30 mg by mouth daily at 12 noon.    . metoprolol tartrate (LOPRESSOR) 25 MG tablet TAKE 1 TABLET THREE TIMES A DAY 270 tablet 2  . Multiple Vitamin (MULTIVITAMIN) tablet Take 1 tablet by mouth daily.      . nitroGLYCERIN (NITROSTAT) 0.4 MG SL tablet Place 0.4 mg under the tongue every 5 (five) minutes as needed for chest pain.      No current facility-administered medications for this visit.    Allergies:  Patient has no known allergies.   Social History: The patient  reports that he has never smoked. He has never used smokeless tobacco.  He reports that he drinks alcohol. He reports that he does not use drugs.   ROS:  Please see the history of present illness. Otherwise, complete review of systems is positive for insomnia.  All other systems are reviewed and negative.   Physical Exam: VS:  BP (!) 161/60   Pulse (!) 52   Ht 5\' 10"  (1.778 m)   Wt 184 lb (83.5 kg)   SpO2 97%   BMI 26.40 kg/m , BMI Body mass index is 26.4 kg/m.  Wt Readings from Last 3 Encounters:  05/19/16 184 lb (83.5 kg)  12/12/15 180 lb (81.6 kg)  11/18/15 184 lb (83.5 kg)    Appears  comfortable at rest.  HEENT: Conjunctiva and lids normal, oropharynx is clear.  Neck: Supple, soft carotid bruits as noted previously.  Lungs: Clear without labored breathing, decreased at the bases.  Cardiac: Regular rate and rhythm, 2/6 murmur at the right base. No S3 gallop.  Abdomen: Soft, nontender, bowel sounds present  Extremities: Trace edema, distal pulses 2+.  Skin: Warm and dry. Musculoskeletal: No kyphosis. Neuropsychiatric: Alert and oriented x3, affect appropriate.  ECG: I personally reviewed the tracing from 12/20/2014 which showed normal sinus rhythm with prolonged PR interval and poor R-wave progression.  Recent Labwork:  12/20/2014: BUN 14; Creatinine, Ser 1.06; Hemoglobin 12.3; Platelets 192; Potassium 5.0; Sodium 134   Other Studies Reviewed Today:  Echocardiogram 10/18/2013: Study Conclusions  - Left ventricle: The cavity size was normal. Wall thickness was normal. Systolic function was normal. The estimated ejection fraction was in the range of 60% to 65%. There is diastolic dysfunction, indeterminant grade. E/e&' 15 consistent with elevated LA pressure. Wall motion was normal; there were no regional wall motion abnormalities. - Aortic valve: Mean gradient (S): 13 mm Hg. Peak gradient (S): 27 mm Hg. VTI ratio of LVOT to aortic valve: 0.55. Valve area (VTI): 1.92 cm^2. Valve area (Vmax): 1.76 cm^2. Valve area (Vmean): 1.67 cm^2. - Mitral valve: Mildly calcified annulus. Mildly thickened leaflets. - Technically adequate study.  Assessment and Plan:  1. Aortic stenosis status post bioprosthetic AVR with CABG in 2013. She continues to do well. No change on examination in terms of heart murmur.  2. CAD status post CABG in 2013. He reports no angina on medical therapy and continues to exercise regularly.  3. Hyperlipidemia on Lipitor. He follows with Dr. Sherril CroonVyas, will be having lab work later this year for physical.  4. Essential  hypertension, blood pressure elevated today. He reports compliance with his medications. We discussed salt restriction. Keep follow-up with Dr. Sherril CroonVyas.  Current medicines were reviewed with the patient today.   Orders Placed This Encounter  Procedures  . EKG 12-Lead    Disposition: Follow-up in 6 months.  Signed, Jonelle SidleSamuel G. Dejah Droessler, MD, [facility]ACC 05/19/2016 2:12 PM     [facility] [address], [place], [place] [address] Phone: [phone]; Fax: [phone]

## 2016-05-19 NOTE — Patient Instructions (Signed)
Medication Instructions:   Your physician recommends that you continue on your current medications as directed. Please refer to the Current Medication list given to you today.  Start melatonin 5 mg by mouth daily at bedtime as needed. This is found over the counter.  Labwork: NONE  Testing/Procedures: NONE  Follow-Up:  Your physician recommends that you schedule a follow-up appointment in: 6 months. You will receive a reminder letter in the mail in about 4 months reminding you to call and schedule your appointment. If you don't receive this letter, please contact our office.  Any Other Special Instructions Will Be Listed Below (If Applicable).  If you need a refill on your cardiac medications before your next appointment, please call your pharmacy.

## 2016-05-27 DIAGNOSIS — H04122 Dry eye syndrome of left lacrimal gland: Secondary | ICD-10-CM | POA: Diagnosis not present

## 2016-06-19 ENCOUNTER — Other Ambulatory Visit: Payer: Self-pay | Admitting: Cardiology

## 2016-07-30 DIAGNOSIS — Z713 Dietary counseling and surveillance: Secondary | ICD-10-CM | POA: Diagnosis not present

## 2016-07-30 DIAGNOSIS — Z6828 Body mass index (BMI) 28.0-28.9, adult: Secondary | ICD-10-CM | POA: Diagnosis not present

## 2016-07-30 DIAGNOSIS — I270 Primary pulmonary hypertension: Secondary | ICD-10-CM | POA: Diagnosis not present

## 2016-07-30 DIAGNOSIS — I10 Essential (primary) hypertension: Secondary | ICD-10-CM | POA: Diagnosis not present

## 2016-07-30 DIAGNOSIS — E7800 Pure hypercholesterolemia, unspecified: Secondary | ICD-10-CM | POA: Diagnosis not present

## 2016-07-30 DIAGNOSIS — N400 Enlarged prostate without lower urinary tract symptoms: Secondary | ICD-10-CM | POA: Diagnosis not present

## 2016-07-30 DIAGNOSIS — I2510 Atherosclerotic heart disease of native coronary artery without angina pectoris: Secondary | ICD-10-CM | POA: Diagnosis not present

## 2016-07-30 DIAGNOSIS — Z299 Encounter for prophylactic measures, unspecified: Secondary | ICD-10-CM | POA: Diagnosis not present

## 2016-10-28 DIAGNOSIS — R69 Illness, unspecified: Secondary | ICD-10-CM | POA: Diagnosis not present

## 2016-11-17 DIAGNOSIS — E7800 Pure hypercholesterolemia, unspecified: Secondary | ICD-10-CM | POA: Diagnosis not present

## 2016-11-17 DIAGNOSIS — I10 Essential (primary) hypertension: Secondary | ICD-10-CM | POA: Diagnosis not present

## 2016-12-02 DIAGNOSIS — Z79899 Other long term (current) drug therapy: Secondary | ICD-10-CM | POA: Diagnosis not present

## 2016-12-02 DIAGNOSIS — Z6827 Body mass index (BMI) 27.0-27.9, adult: Secondary | ICD-10-CM | POA: Diagnosis not present

## 2016-12-02 DIAGNOSIS — E7800 Pure hypercholesterolemia, unspecified: Secondary | ICD-10-CM | POA: Diagnosis not present

## 2016-12-02 DIAGNOSIS — R5383 Other fatigue: Secondary | ICD-10-CM | POA: Diagnosis not present

## 2016-12-02 DIAGNOSIS — Z7189 Other specified counseling: Secondary | ICD-10-CM | POA: Diagnosis not present

## 2016-12-02 DIAGNOSIS — Z1331 Encounter for screening for depression: Secondary | ICD-10-CM | POA: Diagnosis not present

## 2016-12-02 DIAGNOSIS — Z1211 Encounter for screening for malignant neoplasm of colon: Secondary | ICD-10-CM | POA: Diagnosis not present

## 2016-12-02 DIAGNOSIS — Z0000 Encounter for general adult medical examination without abnormal findings: Secondary | ICD-10-CM | POA: Diagnosis not present

## 2016-12-02 DIAGNOSIS — Z299 Encounter for prophylactic measures, unspecified: Secondary | ICD-10-CM | POA: Diagnosis not present

## 2016-12-02 DIAGNOSIS — Z125 Encounter for screening for malignant neoplasm of prostate: Secondary | ICD-10-CM | POA: Diagnosis not present

## 2016-12-02 DIAGNOSIS — Z1339 Encounter for screening examination for other mental health and behavioral disorders: Secondary | ICD-10-CM | POA: Diagnosis not present

## 2016-12-09 DIAGNOSIS — H524 Presbyopia: Secondary | ICD-10-CM | POA: Diagnosis not present

## 2016-12-09 DIAGNOSIS — Z961 Presence of intraocular lens: Secondary | ICD-10-CM | POA: Diagnosis not present

## 2016-12-09 DIAGNOSIS — H04122 Dry eye syndrome of left lacrimal gland: Secondary | ICD-10-CM | POA: Diagnosis not present

## 2016-12-09 DIAGNOSIS — H52203 Unspecified astigmatism, bilateral: Secondary | ICD-10-CM | POA: Diagnosis not present

## 2016-12-09 DIAGNOSIS — H5211 Myopia, right eye: Secondary | ICD-10-CM | POA: Diagnosis not present

## 2016-12-09 DIAGNOSIS — H353131 Nonexudative age-related macular degeneration, bilateral, early dry stage: Secondary | ICD-10-CM | POA: Diagnosis not present

## 2016-12-09 DIAGNOSIS — H5202 Hypermetropia, left eye: Secondary | ICD-10-CM | POA: Diagnosis not present

## 2016-12-24 DIAGNOSIS — E7800 Pure hypercholesterolemia, unspecified: Secondary | ICD-10-CM | POA: Diagnosis not present

## 2016-12-24 DIAGNOSIS — I10 Essential (primary) hypertension: Secondary | ICD-10-CM | POA: Diagnosis not present

## 2016-12-25 ENCOUNTER — Ambulatory Visit: Payer: Medicare HMO | Admitting: Cardiology

## 2016-12-25 ENCOUNTER — Telehealth: Payer: Self-pay | Admitting: Cardiology

## 2016-12-25 ENCOUNTER — Encounter: Payer: Self-pay | Admitting: Cardiology

## 2016-12-25 VITALS — BP 118/54 | HR 55 | Ht 70.0 in | Wt 179.0 lb

## 2016-12-25 DIAGNOSIS — I2510 Atherosclerotic heart disease of native coronary artery without angina pectoris: Secondary | ICD-10-CM | POA: Diagnosis not present

## 2016-12-25 DIAGNOSIS — I10 Essential (primary) hypertension: Secondary | ICD-10-CM | POA: Diagnosis not present

## 2016-12-25 DIAGNOSIS — E782 Mixed hyperlipidemia: Secondary | ICD-10-CM

## 2016-12-25 DIAGNOSIS — Z953 Presence of xenogenic heart valve: Secondary | ICD-10-CM | POA: Diagnosis not present

## 2016-12-25 NOTE — Progress Notes (Signed)
.    Cardiology Office Note  Date: 12/25/2016   ID: Edward Pena, DOB Feb 01, 1926, MRN [id number]  PCP: Ignatius SpeckingVyas, Dhruv B, MD  Primary Cardiologist: Nona DellSamuel McDowell, MD   Chief Complaint  Patient presents with  . Coronary Artery Disease    History of Present Illness: Edward Pena is an 83 y.o. male last seen in May.  He is here with his wife for a follow-up visit.  He reports no angina symptoms or decreasing stamina.  [facility].  Uses a treadmill and elliptical machine.  Reports NYHA class II dyspnea at baseline, no palpitations or syncope.  I reviewed his medications which are stable and outlined below.  He reports no nitroglycerin use.  States that he had lab work with Dr. Sherril CroonVyas recently which we are requesting.  His last echocardiogram was in 2015 as outlined below.  We discussed obtaining a follow-up study to reassess LVEF and aortic gradient.  Past Medical History:  Diagnosis Date  . Aortic regurgitation   . Aortic stenosis   . Blood transfusion   . Carotid artery disease (HCC)    Mild  . Coronary atherosclerosis of native coronary artery    DES LAD 10/07  . Essential hypertension, benign   . GERD (gastroesophageal reflux disease)   . Mixed hyperlipidemia   . Peptic ulcer disease     Past Surgical History:  Procedure Laterality Date  . AORTIC VALVE REPLACEMENT  03/12/2011   Procedure: AORTIC VALVE REPLACEMENT (AVR);  Surgeon: Kathlee NationsPeter Van Suann Larryrigt III, MD;  [facility];  Service: Open Heart Surgery;  Laterality: N/A;  Aortic Valve Replacement   . APPENDECTOMY    . CORONARY ARTERY BYPASS GRAFT  03/12/2011   Procedure: CORONARY ARTERY BYPASS GRAFTING (CABG);  Surgeon: Kathlee NationsPeter Van Suann Larryrigt III, MD;  [facility];  Service: Open Heart Surgery;  Laterality: N/A;  Coronary Artery Bypass Graft on pump times  two utilizing left internal mammary artery and right greater saphenous vein, harvested endoscopically.  Transesophageal echocardiogram     . ESOPHAGOGASTRODUODENOSCOPY N/A 12/12/2015   Procedure: ESOPHAGOGASTRODUODENOSCOPY (EGD);  Surgeon: Malissa HippoNajeeb U Rehman, MD;  [facility];  Service: Endoscopy;  Laterality: N/A;  255  . YAG LASER APPLICATION Left 01/15/2014   Procedure: YAG LASER APPLICATION;  Surgeon: Susa Simmondsarroll F Haines, MD;  Location: [facility];  Service: Ophthalmology;  Laterality: Left;  . YAG LASER APPLICATION Right 02/12/2014   Procedure: YAG LASER APPLICATION;  Surgeon: Susa Simmondsarroll F Haines, MD;  Location: [facility];  Service: Ophthalmology;  Laterality: Right;    Current Outpatient Medications  Medication Sig Dispense Refill  . amLODipine-benazepril (LOTREL) 5-40 MG capsule Take 1 capsule by mouth daily.    [name]. aspirin EC 81 MG tablet Take 1 tablet (81 mg total) by mouth daily. Takes about 2-3 times a week    . atorvastatin (LIPITOR) 20 MG tablet Take 1 tablet (20 mg total) by mouth daily. 30 tablet 6  . lansoprazole (PREVACID) 30 MG capsule Take 30 mg by mouth daily at 12 noon.    . metoprolol tartrate (LOPRESSOR) 25 MG tablet TAKE 1 TABLET THREE TIMES A DAY 270 tablet 2  . Multiple Vitamin (MULTIVITAMIN) tablet Take 1 tablet by mouth daily.      . nitroGLYCERIN (NITROSTAT) 0.4 MG SL tablet Place 0.4 mg under the tongue every 5 (five) minutes as needed for chest pain.      No current facility-administered medications for this visit.  Allergies:  Patient has no known allergies.   Social History: The patient  reports that  has never smoked. he has never used smokeless tobacco. He reports that he drinks alcohol. He reports that he does not use drugs.   ROS:  Please see the history of present illness. Otherwise, complete review of systems is positive for hearing loss.  All other systems are reviewed and negative.   Physical Exam: VS:  BP (!) 118/54   Pulse (!) 55   Ht 5\' 10"  (1.778 m)   Wt 179 lb (81.2 kg)   SpO2 98%   BMI 25.68 kg/m , BMI Body mass index is 25.68 kg/m.  Wt Readings from Last 3 Encounters:   12/25/16 179 lb (81.2 kg)  05/19/16 184 lb (83.5 kg)  12/12/15 180 lb (81.6 kg)    General: Patient appears comfortable at rest. HEENT: Conjunctiva and lids normal, oropharynx clear. Neck: Supple, soft carotid bruits, no thyromegaly. Lungs: Clear to auscultation, nonlabored breathing at rest. Cardiac: Regular rate and rhythm, no S3, 2/6 systolic murmur, no pericardial rub. Abdomen: Soft, nontender, bowel sounds present, no guarding or rebound. Extremities: Trace ankle edema, distal pulses 2+. Skin: Warm and dry. Musculoskeletal: No kyphosis. Neuropsychiatric: Alert and oriented x3, affect grossly appropriate.  ECG: I personally reviewed the tracing from 05/19/2016 which showed sinus bradycardia with prolonged PR interval.  Recent Labwork:  December 2016: BUN 14; Creatinine, Ser 1.06; Hemoglobin 12.3; Platelets 192; Potassium 5.0; Sodium 134  Other Studies Reviewed Today:  Echocardiogram 10/18/2013: Study Conclusions  - Left ventricle: The cavity size was normal. Wall thickness was normal. Systolic function was normal. The estimated ejection fraction was in the range of 60% to 65%. There is diastolic dysfunction, indeterminant grade. E/e&' 15 consistent with elevated LA pressure. Wall motion was normal; there were no regional wall motion abnormalities. - Aortic valve: Mean gradient (S): 13 mm Hg. Peak gradient (S): 27 mm Hg. VTI ratio of LVOT to aortic valve: 0.55. Valve area (VTI): 1.92 cm^2. Valve area (Vmax): 1.76 cm^2. Valve area (Vmean): 1.67 cm^2. - Mitral valve: Mildly calcified annulus. Mildly thickened leaflets. - Technically adequate study.  Assessment and Plan:  1.  CAD status post CABG in 2013.  He continues to do well without significant angina symptoms on medical therapy.  He maintains a regular exercise plan.  Continue observation.  2.  Aortic stenosis status post bioprosthetic AVR in 2013.  We will obtain a follow-up echocardiogram in  comparison to the previous study from 2015.  3.  Hyperlipidemia, continues on Lipitor.  Requesting most recent lab work from Dr. Sherril CroonVyas.  4.  Essential hypertension, blood pressure well controlled today.  Current medicines were reviewed with the patient today.   Orders Placed This Encounter  Procedures  . ECHOCARDIOGRAM COMPLETE    Disposition: Follow-up in 6 months.  Signed, Jonelle SidleSamuel G. McDowell, MD, [facility] 12/25/2016 11:24 AM    [facility] [address], [place], [place] [address] Phone: [phone]; Fax: [phone]

## 2016-12-25 NOTE — Patient Instructions (Signed)

## 2016-12-25 NOTE — Telephone Encounter (Signed)
Pre-cert Verification for the following procedure   °Echo scheduled for 12-30-16  °

## 2016-12-30 ENCOUNTER — Ambulatory Visit (INDEPENDENT_AMBULATORY_CARE_PROVIDER_SITE_OTHER): Payer: Medicare HMO

## 2016-12-30 ENCOUNTER — Other Ambulatory Visit: Payer: Self-pay

## 2016-12-30 DIAGNOSIS — Z953 Presence of xenogenic heart valve: Secondary | ICD-10-CM

## 2016-12-31 ENCOUNTER — Telehealth: Payer: Self-pay

## 2016-12-31 NOTE — Telephone Encounter (Signed)
Patient notified. Routed to PCP 

## 2016-12-31 NOTE — Telephone Encounter (Signed)
-----   Message from Eustace MooreLydia M Anderson, LPN sent at 34/74/259512/20/2018  7:20 AM EST -----   ----- Message ----- From: Jonelle SidleMcDowell, Samuel G, MD Sent: 12/30/2016   5:23 PM To: Eustace MooreLydia M Anderson, LPN  Results reviewed. LVEF remains normal range. Bioprosthetic AVR shows stable gradients and function. Continue with current plan. A copy of this test should be forwarded to Ignatius SpeckingVyas, Dhruv B, MD.

## 2017-01-18 DIAGNOSIS — Z713 Dietary counseling and surveillance: Secondary | ICD-10-CM | POA: Diagnosis not present

## 2017-01-18 DIAGNOSIS — M47817 Spondylosis without myelopathy or radiculopathy, lumbosacral region: Secondary | ICD-10-CM | POA: Diagnosis not present

## 2017-01-18 DIAGNOSIS — M47816 Spondylosis without myelopathy or radiculopathy, lumbar region: Secondary | ICD-10-CM | POA: Diagnosis not present

## 2017-01-18 DIAGNOSIS — M160 Bilateral primary osteoarthritis of hip: Secondary | ICD-10-CM | POA: Diagnosis not present

## 2017-01-18 DIAGNOSIS — M25552 Pain in left hip: Secondary | ICD-10-CM | POA: Diagnosis not present

## 2017-01-18 DIAGNOSIS — Z6828 Body mass index (BMI) 28.0-28.9, adult: Secondary | ICD-10-CM | POA: Diagnosis not present

## 2017-01-18 DIAGNOSIS — I10 Essential (primary) hypertension: Secondary | ICD-10-CM | POA: Diagnosis not present

## 2017-01-18 DIAGNOSIS — Z299 Encounter for prophylactic measures, unspecified: Secondary | ICD-10-CM | POA: Diagnosis not present

## 2017-01-26 DIAGNOSIS — I10 Essential (primary) hypertension: Secondary | ICD-10-CM | POA: Diagnosis not present

## 2017-01-26 DIAGNOSIS — E7800 Pure hypercholesterolemia, unspecified: Secondary | ICD-10-CM | POA: Diagnosis not present

## 2017-02-23 ENCOUNTER — Encounter (INDEPENDENT_AMBULATORY_CARE_PROVIDER_SITE_OTHER): Payer: Self-pay | Admitting: Internal Medicine

## 2017-02-23 ENCOUNTER — Ambulatory Visit (INDEPENDENT_AMBULATORY_CARE_PROVIDER_SITE_OTHER): Payer: Medicare HMO | Admitting: Internal Medicine

## 2017-02-23 VITALS — BP 160/58 | HR 60 | Temp 97.3°F | Ht 67.0 in | Wt 174.3 lb

## 2017-02-23 DIAGNOSIS — K219 Gastro-esophageal reflux disease without esophagitis: Secondary | ICD-10-CM

## 2017-02-23 DIAGNOSIS — R1013 Epigastric pain: Secondary | ICD-10-CM | POA: Diagnosis not present

## 2017-02-23 DIAGNOSIS — R1310 Dysphagia, unspecified: Secondary | ICD-10-CM

## 2017-02-23 DIAGNOSIS — R1319 Other dysphagia: Secondary | ICD-10-CM

## 2017-02-23 HISTORY — DX: Dysphagia, unspecified: R13.10

## 2017-02-23 NOTE — Patient Instructions (Signed)
EGD. The risks of bleeding, perforation and infection were reviewed with patient.  

## 2017-02-23 NOTE — Progress Notes (Signed)
Subjective:    Patient ID: Edward Pena, male    DOB: 1926/12/23, 84 y.o.   MRN: [id number]  HPI Presents today with c/o dysphagia to water.  Hast EGD/ED in November of 2017 which revealed  Impression:               - Normal esophagus.                           - Moderate Schatzki ring. Dilated.                           - Z-line regular, 41 cm from the incisors.                           - 2 cm hiatal hernia.                           - Gastric antral vascular ectasia.                           - Normal duodenal bulb and second portion of the                            duodenum.                           - No specimens collected. Has been evaluated by Dr. Teena DunkBenson in the past. In 2015 underwent and EGD by Dr. Teena DunkBenson for dysphagia. Finding: Hiatal hernia. Mild gastritis in the antrum, esophageal ring.  In 2013 underwent an EGD for melena. Found to have hiatal hernia, a stricture at the distal esophagus, and a duodenal ulcer in the bulb of the duodenum. He was positive for H pylori. He was treated with Biaxin and Amoxicillin x 10 days. Today he states he drank water, says his face flushes.  He has a nagging feeling in his epigastric region. Nagging feeling started about 4 weeks ago.  He has trouble swallowing corn bread.  He says water does not go down like it is suppose to. He occasionally has heart burn. Take Prevacid for this.  Takes ASA daily.  Appetite is okay. No weight. BMs are okay. No melena or BRRB.   Hx of valve replacement in 2013 and has 5 cardiac stents.  Review of Systems Past Medical History:  Diagnosis Date  . Aortic regurgitation   . Aortic stenosis   . Blood transfusion   . Carotid artery disease (HCC)    Mild  . Coronary atherosclerosis of native coronary artery    DES LAD 10/07  . Dysphagia 02/23/2017  . Essential hypertension, benign   . GERD (gastroesophageal reflux disease)   . Mixed hyperlipidemia   . Peptic ulcer disease     Past Surgical History:    Procedure Laterality Date  . AORTIC VALVE REPLACEMENT  03/12/2011   Procedure: AORTIC VALVE REPLACEMENT (AVR);  Surgeon: Kathlee NationsPeter Van Suann Larryrigt III, MD;  [facility];  Service: Open Heart Surgery;  Laterality: N/A;  Aortic Valve Replacement   . APPENDECTOMY    . CORONARY ARTERY BYPASS GRAFT  03/12/2011   Procedure: CORONARY ARTERY BYPASS GRAFTING (CABG);  Surgeon: Kathlee NationsPeter Van Suann Larryrigt III, MD;  [facility];  Service:  Open Heart Surgery;  Laterality: N/A;  Coronary Artery Bypass Graft on pump times  two utilizing left internal mammary artery and right greater saphenous vein, harvested endoscopically.  Transesophageal echocardiogram   . ESOPHAGOGASTRODUODENOSCOPY N/A 12/12/2015   Procedure: ESOPHAGOGASTRODUODENOSCOPY (EGD);  Surgeon: Malissa Hippo, MD;  [facility];  Service: Endoscopy;  Laterality: N/A;  255  . YAG LASER APPLICATION Left 01/15/2014   Procedure: YAG LASER APPLICATION;  Surgeon: Susa Simmonds, MD;  Location[facility];  Service: Ophthalmology;  Laterality: Left;  . YAG LASER APPLICATION Right 02/12/2014   Procedure: YAG LASER APPLICATION;  Surgeon: Susa Simmonds, MD;  Location: [facility];  Service: Ophthalmology;  Laterality: Right;    No Known Allergies  Current Outpatient Medications on File Prior to Visit  Medication Sig Dispense Refill  . amLODipine-benazepril (LOTREL) 5-40 MG capsule Take 1 capsule by mouth daily.    [name] aspirin EC 81 MG tablet Take 1 tablet (81 mg total) by mouth daily. Takes about 2-3 times a week    . atorvastatin (LIPITOR) 20 MG tablet Take 1 tablet (20 mg total) by mouth daily. 30 tablet 6  . lansoprazole (PREVACID) 30 MG capsule Take 30 mg by mouth daily at 12 noon.    . metoprolol tartrate (LOPRESSOR) 25 MG tablet TAKE 1 TABLET THREE TIMES A DAY 270 tablet 2  . Multiple Vitamin (MULTIVITAMIN) tablet Take 1 tablet by mouth daily.      . nitroGLYCERIN (NITROSTAT) 0.4 MG SL tablet Place 0.4 mg under the tongue every 5 (five) minutes as needed for  chest pain.     . [DISCONTINUED] benazepril (LOTENSIN) 40 MG tablet Take 1 tablet (40 mg total) by mouth daily. 30 tablet 1  . [DISCONTINUED] potassium chloride (KLOR-CON) 10 MEQ CR tablet Take 2 tablets (20 mEq total) by mouth as needed. For 5 days then stop. 10 tablet 0   No current facility-administered medications on file prior to visit.         Objective:   Physical Exam Blood pressure (!) 160/58, pulse 60, temperature (!) 97.3 F (36.3 C), height 5\' 7"  (1.702 m), weight 174 lb 4.8 oz (79.1 kg).  Alert and oriented. Skin warm and dry. Oral mucosa is moist.   . Sclera anicteric, conjunctivae is pink. Thyroid not enlarged. No cervical lymphadenopathy. Lungs clear. Heart regular rate and rhythm.  Abdomen is soft. Bowel sounds are positive. No hepatomegaly. No abdominal masses felt. No tenderness.  No edema to lower extremities.          Assessment & Plan:    Epigastric pain.  GERD needs to be ruled out. Dysphagia to water. Really does not have much trouble with solid foods except corn bread.  EGD. The risks of bleeding, perforation and infection were reviewed with patient.

## 2017-03-01 ENCOUNTER — Encounter (INDEPENDENT_AMBULATORY_CARE_PROVIDER_SITE_OTHER): Payer: Self-pay | Admitting: *Deleted

## 2017-03-01 DIAGNOSIS — R1310 Dysphagia, unspecified: Secondary | ICD-10-CM | POA: Insufficient documentation

## 2017-03-01 DIAGNOSIS — R1319 Other dysphagia: Secondary | ICD-10-CM | POA: Insufficient documentation

## 2017-03-15 ENCOUNTER — Other Ambulatory Visit: Payer: Self-pay | Admitting: Cardiology

## 2017-03-24 DIAGNOSIS — I10 Essential (primary) hypertension: Secondary | ICD-10-CM | POA: Diagnosis not present

## 2017-03-24 DIAGNOSIS — E7800 Pure hypercholesterolemia, unspecified: Secondary | ICD-10-CM | POA: Diagnosis not present

## 2017-03-26 ENCOUNTER — Encounter (HOSPITAL_COMMUNITY): Payer: Self-pay | Admitting: *Deleted

## 2017-03-26 ENCOUNTER — Ambulatory Visit (HOSPITAL_COMMUNITY)
Admission: RE | Admit: 2017-03-26 | Discharge: 2017-03-26 | Disposition: A | Discharge status: Home / Self Care | Payer: Medicare HMO | Source: Ambulatory Visit | Attending: Internal Medicine | Admitting: Internal Medicine

## 2017-03-26 ENCOUNTER — Other Ambulatory Visit: Payer: Self-pay

## 2017-03-26 ENCOUNTER — Encounter (HOSPITAL_COMMUNITY)
Admission: RE | Disposition: A | Discharge status: Home / Self Care | Payer: Self-pay | Source: Ambulatory Visit | Attending: Internal Medicine

## 2017-03-26 DIAGNOSIS — I10 Essential (primary) hypertension: Secondary | ICD-10-CM | POA: Diagnosis not present

## 2017-03-26 DIAGNOSIS — E782 Mixed hyperlipidemia: Secondary | ICD-10-CM | POA: Diagnosis not present

## 2017-03-26 DIAGNOSIS — R1319 Other dysphagia: Secondary | ICD-10-CM

## 2017-03-26 DIAGNOSIS — R1310 Dysphagia, unspecified: Secondary | ICD-10-CM | POA: Insufficient documentation

## 2017-03-26 DIAGNOSIS — Z7982 Long term (current) use of aspirin: Secondary | ICD-10-CM | POA: Insufficient documentation

## 2017-03-26 DIAGNOSIS — Z952 Presence of prosthetic heart valve: Secondary | ICD-10-CM | POA: Diagnosis not present

## 2017-03-26 DIAGNOSIS — Z951 Presence of aortocoronary bypass graft: Secondary | ICD-10-CM | POA: Insufficient documentation

## 2017-03-26 DIAGNOSIS — K449 Diaphragmatic hernia without obstruction or gangrene: Secondary | ICD-10-CM | POA: Diagnosis not present

## 2017-03-26 DIAGNOSIS — K3189 Other diseases of stomach and duodenum: Secondary | ICD-10-CM | POA: Diagnosis not present

## 2017-03-26 DIAGNOSIS — Z955 Presence of coronary angioplasty implant and graft: Secondary | ICD-10-CM | POA: Insufficient documentation

## 2017-03-26 DIAGNOSIS — I2510 Atherosclerotic heart disease of native coronary artery without angina pectoris: Secondary | ICD-10-CM | POA: Diagnosis not present

## 2017-03-26 DIAGNOSIS — R1013 Epigastric pain: Secondary | ICD-10-CM | POA: Diagnosis not present

## 2017-03-26 DIAGNOSIS — K219 Gastro-esophageal reflux disease without esophagitis: Secondary | ICD-10-CM | POA: Diagnosis not present

## 2017-03-26 DIAGNOSIS — R1314 Dysphagia, pharyngoesophageal phase: Secondary | ICD-10-CM | POA: Insufficient documentation

## 2017-03-26 DIAGNOSIS — K222 Esophageal obstruction: Secondary | ICD-10-CM | POA: Diagnosis not present

## 2017-03-26 DIAGNOSIS — Z79899 Other long term (current) drug therapy: Secondary | ICD-10-CM | POA: Diagnosis not present

## 2017-03-26 HISTORY — PX: ESOPHAGOGASTRODUODENOSCOPY: SHX5428

## 2017-03-26 SURGERY — EGD (ESOPHAGOGASTRODUODENOSCOPY)
Anesthesia: Moderate Sedation

## 2017-03-26 MED ORDER — MEPERIDINE HCL 50 MG/ML IJ SOLN
INTRAMUSCULAR | Status: AC
  Filled 2017-03-26: qty 1

## 2017-03-26 MED ORDER — MEPERIDINE HCL 50 MG/ML IJ SOLN
INTRAMUSCULAR | Status: DC | PRN
  Administered 2017-03-26 (×2): 25 mg via INTRAVENOUS

## 2017-03-26 MED ORDER — LIDOCAINE VISCOUS 2 % MT SOLN
OROMUCOSAL | Status: DC | PRN
  Administered 2017-03-26: 4 mL via OROMUCOSAL

## 2017-03-26 MED ORDER — STERILE WATER FOR IRRIGATION IR SOLN
Status: DC | PRN
  Administered 2017-03-26: 11:00:00

## 2017-03-26 MED ORDER — MIDAZOLAM HCL 5 MG/5ML IJ SOLN
INTRAMUSCULAR | Status: DC | PRN
  Administered 2017-03-26 (×3): 1 mg via INTRAVENOUS
  Administered 2017-03-26: 2 mg via INTRAVENOUS

## 2017-03-26 MED ORDER — SODIUM CHLORIDE 0.9 % IV SOLN
INTRAVENOUS | Status: DC
  Administered 2017-03-26: 10:00:00 via INTRAVENOUS

## 2017-03-26 MED ORDER — LIDOCAINE VISCOUS 2 % MT SOLN
OROMUCOSAL | Status: AC
  Filled 2017-03-26: qty 15

## 2017-03-26 MED ORDER — MIDAZOLAM HCL 5 MG/5ML IJ SOLN
INTRAMUSCULAR | Status: AC
  Filled 2017-03-26: qty 10

## 2017-03-26 NOTE — Op Note (Signed)
[facility] Patient Name: Edward Pena Procedure Date: 03/26/2017 10:36 AM MRN: [id number] Date of Birth: 1926/07/27 Attending MD: Lionel December , MD CSN: [id number] Age: 84 Admit Type: Outpatient Procedure:                Upper GI endoscopy Indications:              Epigastric abdominal pain, Esophageal dysphagia Providers:                Lionel December, MD, Loma Messing B. Patsy Lager, RN, Edythe Clarity, Technician Referring MD:             Ignatius Specking, MD Medicines:                Lidocaine spray, Meperidine 50 mg IV, Midazolam 5                            mg IV Complications:            No immediate complications. Estimated Blood Loss:     Estimated blood loss was minimal. Procedure:                Pre-Anesthesia Assessment:                           - Prior to the procedure, a History and Physical                            was performed, and patient medications and                            allergies were reviewed. The patient's tolerance of                            previous anesthesia was also reviewed. The risks                            and benefits of the procedure and the sedation                            options and risks were discussed with the patient.                            All questions were answered, and informed consent                            was obtained. Prior Anticoagulants: The patient                            last took aspirin 4 days prior to the procedure.                            ASA Grade Assessment: II - A patient with mild  systemic disease. After reviewing the risks and                            benefits, the patient was deemed in satisfactory                            condition to undergo the procedure.                           After obtaining informed consent, the endoscope was                            passed under direct vision. Throughout the                            procedure, the  patient's blood pressure, pulse, and                            oxygen saturations were monitored continuously. The                            [id number]) scope was introduced through the                            mouth, and advanced to the second part of duodenum.                            The upper GI endoscopy was accomplished without                            difficulty. The patient tolerated the procedure                            well. Scope In: 11:06:15 AM Scope Out: 11:18:07 AM Total Procedure Duration: 0 hours 11 minutes 52 seconds  Findings:      The examined esophagus was normal.      One mild benign-appearing, intrinsic stenosis was found 38 cm from the       incisors. This measured 1.3 cm (inner diameter) x less than one cm (in       length) and was traversed. The scope was withdrawn. Dilation was       performed with a Maloney dilator with mild resistance at 54 Fr. The       dilation site was examined following endoscope reinsertion and showed       moderate improvement in luminal narrowing and no perforation.      A 2 cm hiatal hernia was present.      The entire examined stomach was normal.      A scar was found in the duodenal bulb.      The second portion of the duodenum was normal. Impression:               - Normal esophagus.                           - Benign-appearing esophageal stenosis. Dilated.                           -  2 cm hiatal hernia.                           - Normal stomach.                           - Duodenal scar.                           - Normal second portion of the duodenum.                           - No specimens collected. Moderate Sedation:      Moderate (conscious) sedation was administered by the endoscopy nurse       and supervised by the endoscopist. The following parameters were       monitored: oxygen saturation, heart rate, blood pressure, CO2       capnography and response to care. Total physician intraservice time was        18 minutes. Recommendation:           - Patient has a contact number available for                            emergencies. The signs and symptoms of potential                            delayed complications were discussed with the                            patient. Return to normal activities tomorrow.                            Written discharge instructions were provided to the                            patient.                           - Resume previous diet today.                           - Continue present medications.                           - Resume aspirin at prior dose in 3 days.                           - Telephone GI clinic in 1 week. Procedure Code(s):        --- Professional ---                           [id number], Esophagogastroduodenoscopy, flexible,                            transoral; diagnostic, including collection of  specimen(s) by brushing or washing, when performed                            (separate procedure)                           43450, Dilation of esophagus, by unguided sound or                            bougie, single or multiple passes                           99152, Moderate sedation services provided by the                            same physician or other qualified health care                            professional performing the diagnostic or                            therapeutic service that the sedation supports,                            requiring the presence of an independent trained                            observer to assist in the monitoring of the                            patient's level of consciousness and physiological                            status; initial 15 minutes of intraservice time,                            patient age 7 years or older Diagnosis Code(s):        --- Professional ---                           K22.2, Esophageal obstruction                           K44.9,  Diaphragmatic hernia without obstruction or                            gangrene                           K31.89, Other diseases of stomach and duodenum                           R10.13, Epigastric pain                           R13.14, Dysphagia, pharyngoesophageal phase CPT copyright 2016  American Medical Association. All rights reserved. The codes documented in this report are preliminary and upon coder review may  be revised to meet current compliance requirements. Lionel December, MD Lionel December, MD 03/26/2017 11:30:34 AM This report has been signed electronically. Number of Addenda: 0

## 2017-03-26 NOTE — H&P (Signed)
Edward Pena is an 84 y.o. male.   Chief Complaint: Patient is here for EGD and ED. HPI: Patient is 84 year old Caucasian male with history of GERD and Schatzki's ring who presents with intermittent epigastric pain.  He also complains of dysphagia to solids.  He has had few episodes where he also had difficulty with liquids.  He says few weeks ago he passed out while he was drinking water.  He does not remember having chest pain or other symptoms.  He denies nausea vomiting melena or rectal bleeding.  Past Medical History:  Diagnosis Date  . Aortic regurgitation   . Aortic stenosis   . Blood transfusion   . Carotid artery disease (HCC)    Mild  . Coronary atherosclerosis of native coronary artery    DES LAD 10/07  . Dysphagia 02/23/2017  . Essential hypertension, benign   . GERD (gastroesophageal reflux disease)   . Mixed hyperlipidemia   . Peptic ulcer disease     Past Surgical History:  Procedure Laterality Date  . AORTIC VALVE REPLACEMENT  03/12/2011   Procedure: AORTIC VALVE REPLACEMENT (AVR);  Surgeon: Kathlee Nations Suann Larry, MD;  Location: [facility] OR;  Service: Open Heart Surgery;  Laterality: N/A;  Aortic Valve Replacement   . APPENDECTOMY    . CORONARY ARTERY BYPASS GRAFT  03/12/2011   Procedure: CORONARY ARTERY BYPASS GRAFTING (CABG);  Surgeon: Kathlee Nations Suann Larry, MD;  Location: [facility] OR;  Service: Open Heart Surgery;  Laterality: N/A;  Coronary Artery Bypass Graft on pump times  two utilizing left internal mammary artery and right greater saphenous vein, harvested endoscopically.  Transesophageal echocardiogram   . ESOPHAGOGASTRODUODENOSCOPY N/A 12/12/2015   Procedure: ESOPHAGOGASTRODUODENOSCOPY (EGD);  Surgeon: Malissa Hippo, MD;  Location: [facility] ENDO SUITE;  Service: Endoscopy;  Laterality: N/A;  255  . YAG LASER APPLICATION Left 01/15/2014   Procedure: YAG LASER APPLICATION;  Surgeon: Susa Simmonds, MD;  Location: [facility] ORS;  Service: Ophthalmology;  Laterality: Left;  . YAG LASER  APPLICATION Right 02/12/2014   Procedure: YAG LASER APPLICATION;  Surgeon: Susa Simmonds, MD;  Location: [facility] ORS;  Service: Ophthalmology;  Laterality: Right;    Family History  Problem Relation Age of Onset  . Hypertension Unknown    Social History:  reports that  has never smoked. he has never used smokeless tobacco. He reports that he drinks alcohol. He reports that he does not use drugs.  Allergies: No Known Allergies  Medications Prior to Admission  Medication Sig Dispense Refill  . amLODipine-benazepril (LOTREL) 5-40 MG capsule Take 1 capsule by mouth daily.    [name] aspirin EC 81 MG tablet Take 81 mg by mouth 3 (three) times a week.     [name] atorvastatin (LIPITOR) 20 MG tablet Take 1 tablet (20 mg total) by mouth daily. 30 tablet 6  . lansoprazole (PREVACID) 15 MG capsule Take 15 mg by mouth daily at 12 noon.     . metoprolol tartrate (LOPRESSOR) 25 MG tablet TAKE 1 TABLET THREE TIMES A DAY (Patient taking differently: Take 25 mg by mouth 3 times daily) 270 tablet 2  . Multiple Vitamin (MULTIVITAMIN) tablet Take 1 tablet by mouth daily.      . Polyethylene Glycol 400 (BLINK TEARS OP) Place 2 drops into both eyes daily as needed (for dry eyes).    . nitroGLYCERIN (NITROSTAT) 0.4 MG SL tablet Place 0.4 mg under the tongue every 5 (five) minutes as needed for chest pain.  No results found for this or any previous visit (from the past 48 hour(s)). No results found.  ROS  Blood pressure (!) 173/93, pulse 68, temperature 98.1 F (36.7 C), temperature source Oral, resp. rate 16, SpO2 98 %. Physical Exam  Constitutional: He appears well-developed and well-nourished.  HENT:  Mouth/Throat: Oropharynx is clear and moist.  Eyes: Conjunctivae are normal. No scleral icterus.  Neck: No thyromegaly present.  Cardiovascular: Normal rate, regular rhythm and normal heart sounds.  No murmur heard. Respiratory: Effort normal and breath sounds normal.  Sternal scar.   GI: Soft. He exhibits  no distension and no mass. There is no tenderness.  Musculoskeletal: He exhibits no edema.  Lymphadenopathy:    He has no cervical adenopathy.  Neurological: He is alert.  Skin: Skin is warm and dry.     Assessment/Plan Epigastric pain and esophageal dysphagia. History of Schatzki's ring. EGD with ED.  Lionel DecemberNajeeb Fable Huisman, MD 03/26/2017, 10:54 AM

## 2017-03-26 NOTE — Discharge Instructions (Signed)
Resume aspirin on 03/29/2017. Resume other medications and diet as before. No driving for 24 hours. Please call office with progress report in 1 week.   Upper Endoscopy, Care After Refer to this sheet in the next few weeks. These instructions provide you with information about caring for yourself after your procedure. Your health care provider may also give you more specific instructions. Your treatment has been planned according to current medical practices, but problems sometimes occur. Call your health care provider if you have any problems or questions after your procedure. What can I expect after the procedure? After the procedure, it is common to have:  A sore throat.  Bloating.  Nausea.  Follow these instructions at home:  Follow instructions from your health care provider about what to eat or drink after your procedure.  Return to your normal activities as told by your health care provider. Ask your health care provider what activities are safe for you.  Take over-the-counter and prescription medicines only as told by your health care provider.  Do not drive for 24 hours if you received a sedative.  Keep all follow-up visits as told by your health care provider. This is important. Contact a health care provider if:  You have a sore throat that lasts longer than one day.  You have trouble swallowing. Get help right away if:  You have a fever.  You vomit blood or your vomit looks like coffee grounds.  You have bloody, black, or tarry stools.  You have a severe sore throat or you cannot swallow.  You have difficulty breathing.  You have severe pain in your chest or belly. This information is not intended to replace advice given to you by your health care provider. Make sure you discuss any questions you have with your health care provider. Document Released: 06/30/2011 Document Revised: 06/06/2015 Document Reviewed: 10/11/2014 Elsevier Interactive Patient Education   2018 [employer]  Hiatal Hernia A hiatal hernia occurs when part of the stomach slides above the muscle that separates the abdomen from the chest (diaphragm). A person can be born with a hiatal hernia (congenital), or it may develop over time. In almost all cases of hiatal hernia, only the top part of the stomach pushes through the diaphragm. Many people have a hiatal hernia with no symptoms. The larger the hernia, the more likely it is that you will have symptoms. In some cases, a hiatal hernia allows stomach acid to flow back into the tube that carries food from your mouth to your stomach (esophagus). This may cause heartburn symptoms. Severe heartburn symptoms may mean that you have developed a condition called gastroesophageal reflux disease (GERD). What are the causes? This condition is caused by a weakness in the opening (hiatus) where the esophagus passes through the diaphragm to attach to the upper part of the stomach. A person may be born with a weakness in the hiatus, or a weakness can develop over time. What increases the risk? This condition is more likely to develop in:  Older people. Age is a major risk factor for a hiatal hernia, especially if you are over the age of 16.  Pregnant women.  People who are overweight.  People who have frequent constipation.  What are the signs or symptoms? Symptoms of this condition usually develop in the form of GERD symptoms. Symptoms include:  Heartburn.  Belching.  Indigestion.  Trouble swallowing.  Coughing or wheezing.  Sore throat.  Hoarseness.  Chest pain.  Nausea and vomiting.  How is this diagnosed? This condition may be diagnosed during testing for GERD. Tests that may be done include:  X-rays of your stomach or chest.  An upper gastrointestinal (GI) series. This is an X-ray exam of your GI tract that is taken after you swallow a chalky liquid that shows up clearly on the X-ray.  Endoscopy. This is a procedure  to look into your stomach using a thin, flexible tube that has a tiny camera and light on the end of it.  How is this treated? This condition may be treated by:  Dietary and lifestyle changes to help reduce GERD symptoms.  Medicines. These may include: ? Over-the-counter antacids. ? Medicines that make your stomach empty more quickly. ? Medicines that block the production of stomach acid (H2 blockers). ? Stronger medicines to reduce stomach acid (proton pump inhibitors).  Surgery to repair the hernia, if other treatments are not helping.  If you have no symptoms, you may not need treatment. Follow these instructions at home: Lifestyle and activity  Do not use any products that contain nicotine or tobacco, such as cigarettes and e-cigarettes. If you need help quitting, ask your health care provider.  Try to achieve and maintain a healthy body weight.  Avoid putting pressure on your abdomen. Anything that puts pressure on your abdomen increases the amount of acid that may be pushed up into your esophagus. ? Avoid bending over, especially after eating. ? Raise the head of your bed by putting blocks under the legs. This keeps your head and esophagus higher than your stomach. ? Do not wear tight clothing around your chest or stomach. ? Try not to strain when having a bowel movement, when urinating, or when lifting heavy objects. Eating and drinking  Avoid foods that can worsen GERD symptoms. These may include: ? Fatty foods, like fried foods. ? Citrus fruits, like oranges or lemon. ? Other foods and drinks that contain acid, like orange juice or tomatoes. ? Spicy food. ? Chocolate.  Eat frequent small meals instead of three large meals a day. This helps prevent your stomach from getting too full. ? Eat slowly. ? Do not lie down right after eating. ? Do not eat 1-2 hours before bed.  Do not drink beverages with caffeine. These include cola, coffee, cocoa, and tea.  Do not drink  alcohol. General instructions  Take over-the-counter and prescription medicines only as told by your health care provider.  Keep all follow-up visits as told by your health care provider. This is important. Contact a health care provider if:  Your symptoms are not controlled with medicines or lifestyle changes.  You are having trouble swallowing.  You have coughing or wheezing that will not go away. Get help right away if:  Your pain is getting worse.  Your pain spreads to your arms, neck, jaw, teeth, or back.  You have shortness of breath.  You sweat for no reason.  You feel sick to your stomach (nauseous) or you vomit.  You vomit blood.  You have bright red blood in your stools.  You have black, tarry stools. This information is not intended to replace advice given to you by your health care provider. Make sure you discuss any questions you have with your health care provider. Document Released: 03/21/2003 Document Revised: 12/23/2015 Document Reviewed: 12/23/2015 Elsevier Interactive Patient Education  [employer]2018 [employer]

## 2017-04-01 ENCOUNTER — Encounter (HOSPITAL_COMMUNITY): Payer: Self-pay | Admitting: Internal Medicine

## 2017-04-01 DIAGNOSIS — Z299 Encounter for prophylactic measures, unspecified: Secondary | ICD-10-CM | POA: Diagnosis not present

## 2017-04-01 DIAGNOSIS — I2510 Atherosclerotic heart disease of native coronary artery without angina pectoris: Secondary | ICD-10-CM | POA: Diagnosis not present

## 2017-04-01 DIAGNOSIS — Z6828 Body mass index (BMI) 28.0-28.9, adult: Secondary | ICD-10-CM | POA: Diagnosis not present

## 2017-04-01 DIAGNOSIS — Z789 Other specified health status: Secondary | ICD-10-CM | POA: Diagnosis not present

## 2017-04-01 DIAGNOSIS — E7800 Pure hypercholesterolemia, unspecified: Secondary | ICD-10-CM | POA: Diagnosis not present

## 2017-04-01 DIAGNOSIS — L570 Actinic keratosis: Secondary | ICD-10-CM | POA: Diagnosis not present

## 2017-04-01 DIAGNOSIS — K219 Gastro-esophageal reflux disease without esophagitis: Secondary | ICD-10-CM | POA: Diagnosis not present

## 2017-04-01 DIAGNOSIS — I270 Primary pulmonary hypertension: Secondary | ICD-10-CM | POA: Diagnosis not present

## 2017-04-01 DIAGNOSIS — M25552 Pain in left hip: Secondary | ICD-10-CM | POA: Diagnosis not present

## 2017-04-15 DIAGNOSIS — Z299 Encounter for prophylactic measures, unspecified: Secondary | ICD-10-CM | POA: Diagnosis not present

## 2017-04-15 DIAGNOSIS — L570 Actinic keratosis: Secondary | ICD-10-CM | POA: Diagnosis not present

## 2017-04-15 DIAGNOSIS — Z6828 Body mass index (BMI) 28.0-28.9, adult: Secondary | ICD-10-CM | POA: Diagnosis not present

## 2017-04-21 DIAGNOSIS — Z23 Encounter for immunization: Secondary | ICD-10-CM | POA: Diagnosis not present

## 2017-04-28 DIAGNOSIS — I10 Essential (primary) hypertension: Secondary | ICD-10-CM | POA: Diagnosis not present

## 2017-04-28 DIAGNOSIS — E7800 Pure hypercholesterolemia, unspecified: Secondary | ICD-10-CM | POA: Diagnosis not present

## 2017-06-09 DIAGNOSIS — E7800 Pure hypercholesterolemia, unspecified: Secondary | ICD-10-CM | POA: Diagnosis not present

## 2017-06-09 DIAGNOSIS — I10 Essential (primary) hypertension: Secondary | ICD-10-CM | POA: Diagnosis not present

## 2017-06-23 NOTE — Progress Notes (Signed)
Cardiology Office Note  Date: 06/24/2017   ID: Edward Diegolmer L Cai, DOB 02-19-1926, MRN [id number]  PCP: Ignatius SpeckingVyas, Dhruv B, MD  Primary Cardiologist: Nona DellSamuel Gabrielle Mester, MD   Chief Complaint  Patient presents with  . Coronary Artery Disease    History of Present Illness: Edward Pena is an 84 y.o. male last seen in December 2018.  He is here with his wife for a follow-up visit.  Reports chronic stable dyspnea on exertion, generally NYHA class II.  No exertional chest pain or palpitations.  He denies any recent nitroglycerin use.  Follow-up echocardiogram done in December 2018 revealed LVEF 65 to 70% with grade 1 diastolic dysfunction and stable bioprosthetic aortic valve function.  I personally reviewed his ECG today which shows a sinus bradycardia with nonspecific ST-T changes.  Current cardiac regimen includes aspirin, Lipitor, Lopressor, and as needed nitroglycerin.  Past Medical History:  Diagnosis Date  . Aortic regurgitation   . Aortic stenosis   . Blood transfusion   . Carotid artery disease (HCC)    Mild  . Coronary atherosclerosis of native coronary artery    DES LAD 10/07  . Dysphagia 02/23/2017  . Essential hypertension, benign   . GERD (gastroesophageal reflux disease)   . Mixed hyperlipidemia   . Peptic ulcer disease     Past Surgical History:  Procedure Laterality Date  . AORTIC VALVE REPLACEMENT  03/12/2011   Procedure: AORTIC VALVE REPLACEMENT (AVR);  Surgeon: Kathlee NationsPeter Van Suann Larryrigt III, MD;  [facility];  Service: Open Heart Surgery;  Laterality: N/A;  Aortic Valve Replacement   . APPENDECTOMY    . CORONARY ARTERY BYPASS GRAFT  03/12/2011   Procedure: CORONARY ARTERY BYPASS GRAFTING (CABG);  Surgeon: Kathlee NationsPeter Van Suann Larryrigt III, MD;  [facility];  Service: Open Heart Surgery;  Laterality: N/A;  Coronary Artery Bypass Graft on pump times  two utilizing left internal mammary artery and right greater saphenous vein, harvested endoscopically.  Transesophageal echocardiogram     . ESOPHAGOGASTRODUODENOSCOPY N/A 12/12/2015   Procedure: ESOPHAGOGASTRODUODENOSCOPY (EGD);  Surgeon: Malissa HippoNajeeb U Rehman, MD;  [facility];  Service: Endoscopy;  Laterality: N/A;  255  . ESOPHAGOGASTRODUODENOSCOPY N/A 03/26/2017   Procedure: ESOPHAGOGASTRODUODENOSCOPY (EGD);  Surgeon: Malissa Hippoehman, Najeeb U, MD;  [facility];  Service: Endoscopy;  Laterality: N/A;  10:40  . YAG LASER APPLICATION Left 01/15/2014   Procedure: YAG LASER APPLICATION;  Surgeon: Susa Simmondsarroll F Haines, MD;  Location: [facility] ORS;  Service: Ophthalmology;  Laterality: Left;  . YAG LASER APPLICATION Right 02/12/2014   Procedure: YAG LASER APPLICATION;  Surgeon: Susa Simmondsarroll F Haines, MD;  Location: [facility];  Service: Ophthalmology;  Laterality: Right;    Current Outpatient Medications  Medication Sig Dispense Refill  . amLODipine-benazepril (LOTREL) 5-40 MG capsule Take 1 capsule by mouth daily.    [name]. aspirin EC 81 MG tablet Take 1 tablet (81 mg total) by mouth 3 (three) times a week.    [name]. atorvastatin (LIPITOR) 20 MG tablet Take 1 tablet (20 mg total) by mouth daily. 30 tablet 6  . lansoprazole (PREVACID) 15 MG capsule Take 15 mg by mouth daily at 12 noon.     . metoprolol tartrate (LOPRESSOR) 25 MG tablet TAKE 1 TABLET THREE TIMES A DAY (Patient taking differently: Take 25 mg by mouth 3 times daily) 270 tablet 2  . Multiple Vitamin (MULTIVITAMIN) tablet Take 1 tablet by mouth daily.      . nitroGLYCERIN (NITROSTAT) 0.4 MG SL tablet Place 0.4 mg under the  tongue every 5 (five) minutes as needed for chest pain.     . Polyethylene Glycol 400 (BLINK TEARS OP) Place 2 drops into both eyes daily as needed (for dry eyes).     No current facility-administered medications for this visit.    Allergies:  Patient has no known allergies.   Social History: The patient  reports that he has never smoked. He has never used smokeless tobacco. He reports that he drinks alcohol. He reports that he does not use drugs.   ROS:  Please see  the history of present illness. Otherwise, complete review of systems is positive for hearing loss.  All other systems are reviewed and negative.   Physical Exam: VS:  BP (!) 106/54   Pulse (!) 57   Ht 5\' 10"  (1.778 m)   Wt 178 lb (80.7 kg)   SpO2 97%   BMI 25.54 kg/m , BMI Body mass index is 25.54 kg/m.  Wt Readings from Last 3 Encounters:  06/24/17 178 lb (80.7 kg)  02/23/17 174 lb 4.8 oz (79.1 kg)  12/25/16 179 lb (81.2 kg)    General: Elderly male, appears comfortable at rest. HEENT: Conjunctiva and lids normal, oropharynx clear. Neck: Supple, no elevated JVP or carotid bruits, no thyromegaly. Lungs: Clear to auscultation, nonlabored breathing at rest. Cardiac: Regular rate and rhythm, no S3, 2/6 systolic murmur. Abdomen: Soft, nontender, bowel sounds present. Extremities: Trace ankle edema, distal pulses 2+. Skin: Warm and dry. Musculoskeletal: No kyphosis. Neuropsychiatric: Alert and oriented x3, affect grossly appropriate.  ECG: I personally reviewed the tracing from 05/19/2016 which showed sinus bradycardia with prolonged PR interval.  Recent Labwork:  December 2016: BUN 14; Creatinine, Ser 1.06; Hemoglobin 12.3; Platelets 192; Potassium 5.0; Sodium 134  Other Studies Reviewed Today:  Echocardiogram 12/30/2016: Study Conclusions  - Left ventricle: The cavity size was normal. Wall thickness was   normal. Systolic function was vigorous. The estimated ejection   fraction was in the range of 65% to 70%. Wall motion was normal;   there were no regional wall motion abnormalities. Doppler   parameters are consistent with abnormal left ventricular   relaxation (grade 1 diastolic dysfunction). Doppler parameters   are consistent with high ventricular filling pressure. - Aortic valve: Bioprosthetic aortic valve noted. It is functioning   normally. There was no stenosis. There was trivial regurgitation.   Peak velocity (S): 263 cm/s. Mean gradient (S): 14 mm Hg. - Mitral  valve: Moderately calcified annulus. Mildly thickened,   mildly calcified leaflets . There was trivial regurgitation. - Tricuspid valve: There was mild regurgitation. - Pulmonic valve: There was mild regurgitation. - Pulmonary arteries: PA peak pressure: 44 mm Hg (S).  Assessment and Plan:  1.  Multivessel CAD status post CABG in 2013.  He remains clinically stable without angina on medical therapy and we will continue with observation for now.  Follow-up ischemic testing can be considered if he develops worsening angina.  No changes made to present regimen.  2.  Aortic stenosis status post bioprosthetic AVR in 2013.  Echocardiogram in December 2018 showed stable valve function with LVEF 65 to 70%.  3.  Mixed hyperlipidemia on statin therapy.  He continues to follow with Dr. Sherril Croon.  4.  Essential hypertension, blood pressure well controlled today.  Current medicines were reviewed with the patient today.   Orders Placed This Encounter  Procedures  . EKG 12-Lead    Disposition: Follow-up in 6 months.  Signed, Jonelle Sidle, MD, [facility] 06/24/2017 11:40 AM  [facility] [place], [place], [place] [address] Phone: [phone]; Fax: [phone]

## 2017-06-24 ENCOUNTER — Encounter: Payer: Self-pay | Admitting: Cardiology

## 2017-06-24 ENCOUNTER — Ambulatory Visit (INDEPENDENT_AMBULATORY_CARE_PROVIDER_SITE_OTHER): Payer: Medicare HMO | Admitting: Cardiology

## 2017-06-24 VITALS — BP 106/54 | HR 57 | Ht 70.0 in | Wt 178.0 lb

## 2017-06-24 DIAGNOSIS — E782 Mixed hyperlipidemia: Secondary | ICD-10-CM

## 2017-06-24 DIAGNOSIS — I10 Essential (primary) hypertension: Secondary | ICD-10-CM | POA: Diagnosis not present

## 2017-06-24 DIAGNOSIS — I25119 Atherosclerotic heart disease of native coronary artery with unspecified angina pectoris: Secondary | ICD-10-CM

## 2017-06-24 DIAGNOSIS — Z953 Presence of xenogenic heart valve: Secondary | ICD-10-CM

## 2017-06-24 NOTE — Patient Instructions (Signed)

## 2017-07-05 DIAGNOSIS — Z299 Encounter for prophylactic measures, unspecified: Secondary | ICD-10-CM | POA: Diagnosis not present

## 2017-07-05 DIAGNOSIS — I10 Essential (primary) hypertension: Secondary | ICD-10-CM | POA: Diagnosis not present

## 2017-07-05 DIAGNOSIS — M25552 Pain in left hip: Secondary | ICD-10-CM | POA: Diagnosis not present

## 2017-07-05 DIAGNOSIS — Z6827 Body mass index (BMI) 27.0-27.9, adult: Secondary | ICD-10-CM | POA: Diagnosis not present

## 2017-07-05 DIAGNOSIS — I2510 Atherosclerotic heart disease of native coronary artery without angina pectoris: Secondary | ICD-10-CM | POA: Diagnosis not present

## 2017-08-09 DIAGNOSIS — I10 Essential (primary) hypertension: Secondary | ICD-10-CM | POA: Diagnosis not present

## 2017-08-09 DIAGNOSIS — E7800 Pure hypercholesterolemia, unspecified: Secondary | ICD-10-CM | POA: Diagnosis not present

## 2017-08-12 DIAGNOSIS — R634 Abnormal weight loss: Secondary | ICD-10-CM | POA: Diagnosis not present

## 2017-08-12 DIAGNOSIS — Z6827 Body mass index (BMI) 27.0-27.9, adult: Secondary | ICD-10-CM | POA: Diagnosis not present

## 2017-08-12 DIAGNOSIS — Z299 Encounter for prophylactic measures, unspecified: Secondary | ICD-10-CM | POA: Diagnosis not present

## 2017-08-12 DIAGNOSIS — I270 Primary pulmonary hypertension: Secondary | ICD-10-CM | POA: Diagnosis not present

## 2017-08-12 DIAGNOSIS — I10 Essential (primary) hypertension: Secondary | ICD-10-CM | POA: Diagnosis not present

## 2017-08-12 DIAGNOSIS — K219 Gastro-esophageal reflux disease without esophagitis: Secondary | ICD-10-CM | POA: Diagnosis not present

## 2017-08-12 DIAGNOSIS — R42 Dizziness and giddiness: Secondary | ICD-10-CM | POA: Diagnosis not present

## 2017-08-18 DIAGNOSIS — Z79899 Other long term (current) drug therapy: Secondary | ICD-10-CM | POA: Diagnosis not present

## 2017-08-18 DIAGNOSIS — R5383 Other fatigue: Secondary | ICD-10-CM | POA: Diagnosis not present

## 2017-08-25 DIAGNOSIS — Z952 Presence of prosthetic heart valve: Secondary | ICD-10-CM | POA: Diagnosis not present

## 2017-08-25 DIAGNOSIS — I10 Essential (primary) hypertension: Secondary | ICD-10-CM | POA: Diagnosis not present

## 2017-08-25 DIAGNOSIS — K219 Gastro-esophageal reflux disease without esophagitis: Secondary | ICD-10-CM | POA: Diagnosis not present

## 2017-08-25 DIAGNOSIS — Z820 Family history of epilepsy and other diseases of the nervous system: Secondary | ICD-10-CM | POA: Diagnosis not present

## 2017-08-25 DIAGNOSIS — E785 Hyperlipidemia, unspecified: Secondary | ICD-10-CM | POA: Diagnosis not present

## 2017-08-25 DIAGNOSIS — Z7982 Long term (current) use of aspirin: Secondary | ICD-10-CM | POA: Diagnosis not present

## 2017-08-25 DIAGNOSIS — Z8249 Family history of ischemic heart disease and other diseases of the circulatory system: Secondary | ICD-10-CM | POA: Diagnosis not present

## 2017-08-25 DIAGNOSIS — K08409 Partial loss of teeth, unspecified cause, unspecified class: Secondary | ICD-10-CM | POA: Diagnosis not present

## 2017-08-25 DIAGNOSIS — Z85828 Personal history of other malignant neoplasm of skin: Secondary | ICD-10-CM | POA: Diagnosis not present

## 2017-08-25 DIAGNOSIS — I2510 Atherosclerotic heart disease of native coronary artery without angina pectoris: Secondary | ICD-10-CM | POA: Diagnosis not present

## 2017-09-03 DIAGNOSIS — E7800 Pure hypercholesterolemia, unspecified: Secondary | ICD-10-CM | POA: Diagnosis not present

## 2017-09-03 DIAGNOSIS — I10 Essential (primary) hypertension: Secondary | ICD-10-CM | POA: Diagnosis not present

## 2017-09-06 DIAGNOSIS — Z299 Encounter for prophylactic measures, unspecified: Secondary | ICD-10-CM | POA: Diagnosis not present

## 2017-09-06 DIAGNOSIS — Z713 Dietary counseling and surveillance: Secondary | ICD-10-CM | POA: Diagnosis not present

## 2017-09-06 DIAGNOSIS — I10 Essential (primary) hypertension: Secondary | ICD-10-CM | POA: Diagnosis not present

## 2017-09-06 DIAGNOSIS — Z6827 Body mass index (BMI) 27.0-27.9, adult: Secondary | ICD-10-CM | POA: Diagnosis not present

## 2017-09-20 DIAGNOSIS — R5383 Other fatigue: Secondary | ICD-10-CM | POA: Diagnosis not present

## 2017-10-01 DIAGNOSIS — E7800 Pure hypercholesterolemia, unspecified: Secondary | ICD-10-CM | POA: Diagnosis not present

## 2017-10-01 DIAGNOSIS — I10 Essential (primary) hypertension: Secondary | ICD-10-CM | POA: Diagnosis not present

## 2017-10-27 DIAGNOSIS — R5383 Other fatigue: Secondary | ICD-10-CM | POA: Diagnosis not present

## 2017-11-10 DIAGNOSIS — I10 Essential (primary) hypertension: Secondary | ICD-10-CM | POA: Diagnosis not present

## 2017-11-10 DIAGNOSIS — E7800 Pure hypercholesterolemia, unspecified: Secondary | ICD-10-CM | POA: Diagnosis not present

## 2017-11-24 DIAGNOSIS — R5383 Other fatigue: Secondary | ICD-10-CM | POA: Diagnosis not present

## 2017-12-08 DIAGNOSIS — Z1339 Encounter for screening examination for other mental health and behavioral disorders: Secondary | ICD-10-CM | POA: Diagnosis not present

## 2017-12-08 DIAGNOSIS — Z6827 Body mass index (BMI) 27.0-27.9, adult: Secondary | ICD-10-CM | POA: Diagnosis not present

## 2017-12-08 DIAGNOSIS — Z299 Encounter for prophylactic measures, unspecified: Secondary | ICD-10-CM | POA: Diagnosis not present

## 2017-12-08 DIAGNOSIS — Z79899 Other long term (current) drug therapy: Secondary | ICD-10-CM | POA: Diagnosis not present

## 2017-12-08 DIAGNOSIS — Z0000 Encounter for general adult medical examination without abnormal findings: Secondary | ICD-10-CM | POA: Diagnosis not present

## 2017-12-08 DIAGNOSIS — I270 Primary pulmonary hypertension: Secondary | ICD-10-CM | POA: Diagnosis not present

## 2017-12-08 DIAGNOSIS — Z1211 Encounter for screening for malignant neoplasm of colon: Secondary | ICD-10-CM | POA: Diagnosis not present

## 2017-12-08 DIAGNOSIS — N400 Enlarged prostate without lower urinary tract symptoms: Secondary | ICD-10-CM | POA: Diagnosis not present

## 2017-12-08 DIAGNOSIS — Z1331 Encounter for screening for depression: Secondary | ICD-10-CM | POA: Diagnosis not present

## 2017-12-08 DIAGNOSIS — Z125 Encounter for screening for malignant neoplasm of prostate: Secondary | ICD-10-CM | POA: Diagnosis not present

## 2017-12-08 DIAGNOSIS — E7800 Pure hypercholesterolemia, unspecified: Secondary | ICD-10-CM | POA: Diagnosis not present

## 2017-12-08 DIAGNOSIS — R5383 Other fatigue: Secondary | ICD-10-CM | POA: Diagnosis not present

## 2017-12-08 DIAGNOSIS — Z7189 Other specified counseling: Secondary | ICD-10-CM | POA: Diagnosis not present

## 2017-12-14 ENCOUNTER — Encounter (INDEPENDENT_AMBULATORY_CARE_PROVIDER_SITE_OTHER): Payer: Self-pay | Admitting: *Deleted

## 2017-12-15 DIAGNOSIS — I10 Essential (primary) hypertension: Secondary | ICD-10-CM | POA: Diagnosis not present

## 2017-12-15 DIAGNOSIS — D649 Anemia, unspecified: Secondary | ICD-10-CM | POA: Diagnosis not present

## 2017-12-15 DIAGNOSIS — Z299 Encounter for prophylactic measures, unspecified: Secondary | ICD-10-CM | POA: Diagnosis not present

## 2017-12-15 DIAGNOSIS — Z6827 Body mass index (BMI) 27.0-27.9, adult: Secondary | ICD-10-CM | POA: Diagnosis not present

## 2017-12-15 DIAGNOSIS — I2510 Atherosclerotic heart disease of native coronary artery without angina pectoris: Secondary | ICD-10-CM | POA: Diagnosis not present

## 2017-12-15 DIAGNOSIS — N400 Enlarged prostate without lower urinary tract symptoms: Secondary | ICD-10-CM | POA: Diagnosis not present

## 2017-12-15 DIAGNOSIS — M25552 Pain in left hip: Secondary | ICD-10-CM | POA: Diagnosis not present

## 2017-12-29 ENCOUNTER — Encounter: Payer: Self-pay | Admitting: *Deleted

## 2017-12-30 ENCOUNTER — Ambulatory Visit: Payer: Medicare HMO | Admitting: Cardiology

## 2017-12-30 ENCOUNTER — Encounter: Payer: Self-pay | Admitting: Cardiology

## 2017-12-30 VITALS — BP 141/54 | HR 50 | Ht 70.0 in | Wt 177.2 lb

## 2017-12-30 DIAGNOSIS — I25119 Atherosclerotic heart disease of native coronary artery with unspecified angina pectoris: Secondary | ICD-10-CM | POA: Diagnosis not present

## 2017-12-30 DIAGNOSIS — E782 Mixed hyperlipidemia: Secondary | ICD-10-CM

## 2017-12-30 DIAGNOSIS — I10 Essential (primary) hypertension: Secondary | ICD-10-CM

## 2017-12-30 DIAGNOSIS — Z953 Presence of xenogenic heart valve: Secondary | ICD-10-CM | POA: Diagnosis not present

## 2017-12-30 MED ORDER — NITROGLYCERIN 0.4 MG SL SUBL: 0.4000 mg | SUBLINGUAL_TABLET | SUBLINGUAL | 3 refills | Status: DC | PRN

## 2017-12-30 NOTE — Patient Instructions (Addendum)

## 2017-12-30 NOTE — Progress Notes (Signed)
Cardiology Office Note  Date: 12/30/2017   ID: Edward Pena, DOB [birth year], MRN [id number]  PCP: Ignatius SpeckingVyas, Dhruv B, MD  Primary Cardiologist: Nona DellSamuel Cindy Fullman, MD   Chief Complaint  Patient presents with  . Coronary Artery Disease    History of Present Illness: Edward Pena is an 84 y.o. male last seen in June.  He is here for a routine visit.  He reports no angina symptoms or nitroglycerin use.  He remains functional with ADLs.  He and his wife are preparing to get together with their children for Christmas.  I reviewed his medications.  He continues on aspirin, Lotrel, Lipitor, and Lopressor.  We are sending in a prescription for a new bottle of nitroglycerin.  Echocardiogram from 1 year ago demonstrated LVEF 65 to 70% with stable bioprosthetic AVR and trivial aortic regurgitation.  Past Medical History:  Diagnosis Date  . Aortic regurgitation   . Aortic stenosis   . Blood transfusion   . Carotid artery disease (HCC)    Mild  . Coronary atherosclerosis of native coronary artery    DES LAD 10/07  . Dysphagia 02/23/2017  . Essential hypertension, benign   . GERD (gastroesophageal reflux disease)   . Mixed hyperlipidemia   . Peptic ulcer disease     Past Surgical History:  Procedure Laterality Date  . AORTIC VALVE REPLACEMENT  03/12/2011   Procedure: AORTIC VALVE REPLACEMENT (AVR);  Surgeon: Kathlee NationsPeter Van Suann Larryrigt III, MD;  [facility];  Service: Open Heart Surgery;  Laterality: N/A;  Aortic Valve Replacement   . APPENDECTOMY    . CORONARY ARTERY BYPASS GRAFT  03/12/2011   Procedure: CORONARY ARTERY BYPASS GRAFTING (CABG);  Surgeon: Kathlee NationsPeter Van Suann Larryrigt III, MD;  [facility];  Service: Open Heart Surgery;  Laterality: N/A;  Coronary Artery Bypass Graft on pump times  two utilizing left internal mammary artery and right greater saphenous vein, harvested endoscopically.  Transesophageal echocardiogram   . ESOPHAGOGASTRODUODENOSCOPY N/A 12/12/2015   Procedure:  ESOPHAGOGASTRODUODENOSCOPY (EGD);  Surgeon: Malissa HippoNajeeb U Rehman, MD;  [facility];  Service: Endoscopy;  Laterality: N/A;  255  . ESOPHAGOGASTRODUODENOSCOPY N/A 03/26/2017   Procedure: ESOPHAGOGASTRODUODENOSCOPY (EGD);  Surgeon: Malissa Hippoehman, Najeeb U, MD;  [facility];  Service: Endoscopy;  Laterality: N/A;  10:40  . YAG LASER APPLICATION Left 01/15/2014   Procedure: YAG LASER APPLICATION;  Surgeon: Susa Simmondsarroll F Haines, MD;  Location: [facility];  Service: Ophthalmology;  Laterality: Left;  . YAG LASER APPLICATION Right 02/12/2014   Procedure: YAG LASER APPLICATION;  Surgeon: Susa Simmondsarroll F Haines, MD;  Location: [facility]S;  Service: Ophthalmology;  Laterality: Right;    Current Outpatient Medications  Medication Sig Dispense Refill  . amLODipine-benazepril (LOTREL) 5-40 MG capsule Take 1 capsule by mouth daily.    [name]. aspirin EC 81 MG tablet Take 1 tablet (81 mg total) by mouth 3 (three) times a week.    [name]. atorvastatin (LIPITOR) 20 MG tablet Take 1 tablet (20 mg total) by mouth daily. 30 tablet 6  . lansoprazole (PREVACID) 15 MG capsule Take 15 mg by mouth daily at 12 noon.     . metoprolol tartrate (LOPRESSOR) 25 MG tablet TAKE 1 TABLET THREE TIMES A DAY (Patient taking differently: Take 25 mg by mouth 3 times daily) 270 tablet 2  . Multiple Vitamin (MULTIVITAMIN) tablet Take 1 tablet by mouth daily.      . nitroGLYCERIN (NITROSTAT) 0.4 MG SL tablet Place 1 tablet (0.4 mg total) under the tongue every  5 (five) minutes x 3 doses as needed for chest pain (if no relief after 3rd dose proceed to the ED for an evaluation). 25 tablet 3  . Polyethylene Glycol 400 (BLINK TEARS OP) Place 2 drops into both eyes daily as needed (for dry eyes).     No current facility-administered medications for this visit.    Allergies:  Patient has no known allergies.   Social History: The patient  reports that he has never smoked. He has never used smokeless tobacco. He reports current alcohol use. He reports that he does  not use drugs.   ROS:  Please see the history of present illness. Otherwise, complete review of systems is positive for hearing loss.  All other systems are reviewed and negative.   Physical Exam: VS:  BP (!) 141/54   Pulse (!) 50   Ht 5\' 10"  (1.778 m)   Wt 177 lb 3.2 oz (80.4 kg)   SpO2 99%   BMI 25.43 kg/m , BMI Body mass index is 25.43 kg/m.  Wt Readings from Last 3 Encounters:  12/30/17 177 lb 3.2 oz (80.4 kg)  06/24/17 178 lb (80.7 kg)  02/23/17 174 lb 4.8 oz (79.1 kg)    General: Elderly male, appears comfortable at rest. HEENT: Conjunctiva and lids normal, oropharynx clear. Neck: Supple, no elevated JVP or carotid bruits, no thyromegaly. Lungs: Clear to auscultation, nonlabored breathing at rest. Cardiac: Regular rate and rhythm, no S3, 2/6 systolic murmur. Abdomen: Soft, nontender, bowel sounds present. Extremities: Trace ankle edema, distal pulses 2+. Skin: Warm and dry. Musculoskeletal: No kyphosis. Neuropsychiatric: Alert and oriented x3, affect grossly appropriate.  ECG: I personally reviewed the tracing from 06/24/2017 which showed sinus bradycardia with nonspecific ST-T changes.  Recent Labwork:  December 2016: BUN 14; Creatinine, Ser 1.06; Hemoglobin 12.3; Platelets 192; Potassium 5.0; Sodium 134  Other Studies Reviewed Today:  Echocardiogram 12/30/2016: Study Conclusions  - Left ventricle: The cavity size was normal. Wall thickness was normal. Systolic function was vigorous. The estimated ejection fraction was in the range of 65% to 70%. Wall motion was normal; there were no regional wall motion abnormalities. Doppler parameters are consistent with abnormal left ventricular relaxation (grade 1 diastolic dysfunction). Doppler parameters are consistent with high ventricular filling pressure. - Aortic valve: Bioprosthetic aortic valve noted. It is functioning normally. There was no stenosis. There was trivial regurgitation. Peak velocity  (S): 263 cm/s. Mean gradient (S): 14 mm Hg. - Mitral valve: Moderately calcified annulus. Mildly thickened, mildly calcified leaflets . There was trivial regurgitation. - Tricuspid valve: There was mild regurgitation. - Pulmonic valve: There was mild regurgitation. - Pulmonary arteries: PA peak pressure: 44 mm Hg (S).  Assessment and Plan:  1.  Multivessel CAD status post CABG in 2013.  He continues to do very well without active angina on medical therapy.  Prescription given for new bottle of nitroglycerin.  Continue observation.  2.  Aortic stenosis status post bioprosthetic AVR at the time of CABG.  Echocardiogram from last year showed stable valve function.  3.  Mixed hyperlipidemia, on Lipitor.  He follows with Dr. Sherril CroonVyas.  4.  Essential hypertension, blood pressure is adequately controlled today.  Current medicines were reviewed with the patient today.  Disposition: Follow-up in 6 months.  Signed, Jonelle SidleSamuel G. Danita Proud, MD, [facility] 12/30/2017 2:14 PM    [facility] [address], [place], [place] [address] Phone: [phone]; Fax: [phone]

## 2018-01-10 ENCOUNTER — Ambulatory Visit (INDEPENDENT_AMBULATORY_CARE_PROVIDER_SITE_OTHER): Payer: Medicare HMO | Admitting: Internal Medicine

## 2018-01-10 ENCOUNTER — Encounter (INDEPENDENT_AMBULATORY_CARE_PROVIDER_SITE_OTHER): Payer: Self-pay | Admitting: Internal Medicine

## 2018-01-10 VITALS — BP 154/60 | HR 60 | Temp 97.5°F | Ht 70.0 in | Wt 174.5 lb

## 2018-01-10 DIAGNOSIS — D508 Other iron deficiency anemias: Secondary | ICD-10-CM | POA: Diagnosis not present

## 2018-01-10 NOTE — Patient Instructions (Signed)
Labs and stool cards. Further recommendations to follow.

## 2018-01-10 NOTE — Progress Notes (Signed)
Subjective:    Patient ID: Edward Pena, male    DOB: 12/07/1926, 84 y.o.   MRN: [id number] Dr. Sherril CroonVyas HPI Referred by Dr. Sherril CroonVyas for  Screening colonoscopy. H and H 12/08/2017. 12.7 and 36.9.  Last seen in our office in February of this year for dysphagia. Underwent an EGD/ED in March of this year> Impression:               - Normal esophagus.                           - Benign-appearing esophageal stenosis. Dilated.                           - 2 cm hiatal hernia.                           - Normal stomach.                           - Duodenal scar.                           - Normal second portion of the duodenum.e in February of this  year for GERD/dysphagia. Underwent an EGD/ED. Patient states he has not seen any blood. Stools are the same. No melena or BRRB. No weight loss. Has a BM daily.   Last colonoscopy was years ago by Dr. Cleotis NipperFleishman. His appetite is good.   Review of Systems Past Medical History:  Diagnosis Date  . Aortic regurgitation   . Aortic stenosis   . Blood transfusion   . Carotid artery disease (HCC)    Mild  . Coronary atherosclerosis of native coronary artery    DES LAD 10/07  . Dysphagia 02/23/2017  . Essential hypertension, benign   . GERD (gastroesophageal reflux disease)   . Mixed hyperlipidemia   . Peptic ulcer disease     Past Surgical History:  Procedure Laterality Date  . AORTIC VALVE REPLACEMENT  03/12/2011   Procedure: AORTIC VALVE REPLACEMENT (AVR);  Surgeon: Kathlee NationsPeter Van Suann Larryrigt III, MD;  [facility];  Service: Open Heart Surgery;  Laterality: N/A;  Aortic Valve Replacement   . APPENDECTOMY    . CORONARY ARTERY BYPASS GRAFT  03/12/2011   Procedure: CORONARY ARTERY BYPASS GRAFTING (CABG);  Surgeon: Kathlee NationsPeter Van Suann Larryrigt III, MD;  [facility];  Service: Open Heart Surgery;  Laterality: N/A;  Coronary Artery Bypass Graft on pump times  two utilizing left internal mammary artery and right greater saphenous vein, harvested endoscopically.  Transesophageal  echocardiogram   . ESOPHAGOGASTRODUODENOSCOPY N/A 12/12/2015   Procedure: ESOPHAGOGASTRODUODENOSCOPY (EGD);  Surgeon: Malissa HippoNajeeb U Rehman, MD;  [facility];  Service: Endoscopy;  Laterality: N/A;  255  . ESOPHAGOGASTRODUODENOSCOPY N/A 03/26/2017   Procedure: ESOPHAGOGASTRODUODENOSCOPY (EGD);  Surgeon: Malissa Hippoehman, Najeeb U, MD;  [facility];  Service: Endoscopy;  Laterality: N/A;  10:40  . YAG LASER APPLICATION Left 01/15/2014   Procedure: YAG LASER APPLICATION;  Surgeon: Susa Simmondsarroll F Haines, MD;  Location[facility];  Service: Ophthalmology;  Laterality: Left;  . YAG LASER APPLICATION Right 02/12/2014   Procedure: YAG LASER APPLICATION;  Surgeon: Susa Simmondsarroll F Haines, MD;  Location: [facility];  Service: Ophthalmology;  Laterality: Right;    No Known Allergies  Current Outpatient Medications on  File Prior to Visit  Medication Sig Dispense Refill  . amLODipine-benazepril (LOTREL) 5-40 MG capsule Take 1 capsule by mouth daily.    [name]. aspirin EC 81 MG tablet Take 1 tablet (81 mg total) by mouth 3 (three) times a week.    [name]. atorvastatin (LIPITOR) 20 MG tablet Take 1 tablet (20 mg total) by mouth daily. 30 tablet 6  . metoprolol tartrate (LOPRESSOR) 25 MG tablet TAKE 1 TABLET THREE TIMES A DAY (Patient taking differently: Take 25 mg by mouth 3 times daily) 270 tablet 2  . Multiple Vitamin (MULTIVITAMIN) tablet Take 1 tablet by mouth daily.      . nitroGLYCERIN (NITROSTAT) 0.4 MG SL tablet Place 1 tablet (0.4 mg total) under the tongue every 5 (five) minutes x 3 doses as needed for chest pain (if no relief after 3rd dose proceed to the ED for an evaluation). 25 tablet 3  . Polyethylene Glycol 400 (BLINK TEARS OP) Place 2 drops into both eyes daily as needed (for dry eyes).    . [DISCONTINUED] benazepril (LOTENSIN) 40 MG tablet Take 1 tablet (40 mg total) by mouth daily. 30 tablet 1  . [DISCONTINUED] potassium chloride (KLOR-CON) 10 MEQ CR tablet Take 2 tablets (20 mEq total) by mouth as needed. For 5  days then stop. 10 tablet 0   No current facility-administered medications on file prior to visit.         Objective:   Physical Exam Blood pressure (!) 154/60, pulse 60, temperature (!) 97.5 F (36.4 C), height 5\' 10"  (1.778 m), weight 174 lb 8 oz (79.2 kg). Alert and oriented. Skin warm and dry. Oral mucosa is moist.   . Sclera anicteric, conjunctivae is pink. Thyroid not enlarged. No cervical lymphadenopathy. Lungs clear. Heart regular rate and rhythm.  Abdomen is soft. Bowel sounds are positive. No hepatomegaly. No abdominal masses felt. No tenderness.  No edema to lower extremities. Very health gentleman.           Assessment & Plan:  ? Anemia. Am going to get iron studies. 3 stool cards home with patient.  Further recommendations to follow.

## 2018-01-11 LAB — HEMOGLOBIN AND HEMATOCRIT, BLOOD
HCT: 35.5 % — ABNORMAL LOW (ref 38.5–50.0)
Hemoglobin: 12.6 g/dL — ABNORMAL LOW (ref 13.2–17.1)

## 2018-01-11 LAB — IRON, TOTAL/TOTAL IRON BINDING CAP
%SAT: 34 % (calc) (ref 20–48)
IRON: 96 ug/dL (ref 50–180)
TIBC: 282 ug/dL (ref 250–425)

## 2018-01-11 LAB — FERRITIN: Ferritin: 114 ng/mL (ref 24–380)

## 2018-01-17 DIAGNOSIS — I10 Essential (primary) hypertension: Secondary | ICD-10-CM | POA: Diagnosis not present

## 2018-01-17 DIAGNOSIS — E7800 Pure hypercholesterolemia, unspecified: Secondary | ICD-10-CM | POA: Diagnosis not present

## 2018-01-21 ENCOUNTER — Telehealth (INDEPENDENT_AMBULATORY_CARE_PROVIDER_SITE_OTHER): Payer: Self-pay | Admitting: *Deleted

## 2018-01-21 NOTE — Telephone Encounter (Signed)
   Diagnosis:    Result(s)   Card 1: Negative:     Card 2 Negative:   Card 3:Negative:    Completed by: Larose Hires , LPN   HEMOCCULT SENSA DEVELOPER: LOT#:  [id number] S EXPIRATION DATE: 2020-05   HEMOCCULT SENSA CARD:  LOT#:  [id number] EXPIRATION DATE: 05/21   CARD CONTROL RESULTS:  POSITIVE: Positive NEGATIVE: Negative    ADDITIONAL COMMENTS: Results forwarded to the ordering provider, Delrae Rend . Patient has not been called.

## 2018-01-24 ENCOUNTER — Other Ambulatory Visit (INDEPENDENT_AMBULATORY_CARE_PROVIDER_SITE_OTHER): Payer: Self-pay | Admitting: *Deleted

## 2018-01-24 DIAGNOSIS — K279 Peptic ulcer, site unspecified, unspecified as acute or chronic, without hemorrhage or perforation: Secondary | ICD-10-CM

## 2018-01-24 NOTE — Telephone Encounter (Signed)
Results given to wife. All labs are looking good.   Tammy, H and H in 4 weeks. Please send letter

## 2018-01-24 NOTE — Telephone Encounter (Signed)
H&H noted for 4 weeks and a letter will be sent as a reminder. 

## 2018-02-07 ENCOUNTER — Other Ambulatory Visit (INDEPENDENT_AMBULATORY_CARE_PROVIDER_SITE_OTHER): Payer: Self-pay | Admitting: *Deleted

## 2018-02-07 ENCOUNTER — Encounter (INDEPENDENT_AMBULATORY_CARE_PROVIDER_SITE_OTHER): Payer: Self-pay | Admitting: *Deleted

## 2018-02-07 DIAGNOSIS — K279 Peptic ulcer, site unspecified, unspecified as acute or chronic, without hemorrhage or perforation: Secondary | ICD-10-CM

## 2018-02-21 DIAGNOSIS — K279 Peptic ulcer, site unspecified, unspecified as acute or chronic, without hemorrhage or perforation: Secondary | ICD-10-CM | POA: Diagnosis not present

## 2018-02-21 LAB — HEMOGLOBIN AND HEMATOCRIT, BLOOD
HCT: 37.5 % — ABNORMAL LOW (ref 38.5–50.0)
Hemoglobin: 12.6 g/dL — ABNORMAL LOW (ref 13.2–17.1)

## 2018-02-23 ENCOUNTER — Other Ambulatory Visit (INDEPENDENT_AMBULATORY_CARE_PROVIDER_SITE_OTHER): Payer: Self-pay | Admitting: *Deleted

## 2018-02-23 DIAGNOSIS — K279 Peptic ulcer, site unspecified, unspecified as acute or chronic, without hemorrhage or perforation: Secondary | ICD-10-CM

## 2018-03-31 ENCOUNTER — Other Ambulatory Visit (INDEPENDENT_AMBULATORY_CARE_PROVIDER_SITE_OTHER): Payer: Self-pay | Admitting: *Deleted

## 2018-03-31 ENCOUNTER — Encounter (INDEPENDENT_AMBULATORY_CARE_PROVIDER_SITE_OTHER): Payer: Self-pay | Admitting: *Deleted

## 2018-03-31 DIAGNOSIS — K279 Peptic ulcer, site unspecified, unspecified as acute or chronic, without hemorrhage or perforation: Secondary | ICD-10-CM

## 2018-04-20 DIAGNOSIS — K279 Peptic ulcer, site unspecified, unspecified as acute or chronic, without hemorrhage or perforation: Secondary | ICD-10-CM | POA: Diagnosis not present

## 2018-04-20 LAB — HEMOGLOBIN AND HEMATOCRIT, BLOOD
HEMATOCRIT: 34.7 % — AB (ref 38.5–50.0)
HEMOGLOBIN: 12.1 g/dL — AB (ref 13.2–17.1)

## 2018-05-04 DIAGNOSIS — I10 Essential (primary) hypertension: Secondary | ICD-10-CM | POA: Diagnosis not present

## 2018-05-04 DIAGNOSIS — R0789 Other chest pain: Secondary | ICD-10-CM | POA: Diagnosis not present

## 2018-05-04 DIAGNOSIS — M25552 Pain in left hip: Secondary | ICD-10-CM | POA: Diagnosis not present

## 2018-05-04 DIAGNOSIS — Z299 Encounter for prophylactic measures, unspecified: Secondary | ICD-10-CM | POA: Diagnosis not present

## 2018-05-04 DIAGNOSIS — I2510 Atherosclerotic heart disease of native coronary artery without angina pectoris: Secondary | ICD-10-CM | POA: Diagnosis not present

## 2018-06-27 ENCOUNTER — Telehealth: Payer: Self-pay | Admitting: Cardiology

## 2018-06-27 NOTE — Telephone Encounter (Signed)

## 2018-06-28 ENCOUNTER — Encounter: Payer: Self-pay | Admitting: Cardiology

## 2018-06-28 ENCOUNTER — Other Ambulatory Visit: Payer: Self-pay

## 2018-06-28 ENCOUNTER — Ambulatory Visit (INDEPENDENT_AMBULATORY_CARE_PROVIDER_SITE_OTHER): Payer: Medicare HMO | Admitting: Cardiology

## 2018-06-28 VITALS — BP 155/57 | HR 57 | Temp 97.3°F | Ht 70.0 in | Wt 182.0 lb

## 2018-06-28 DIAGNOSIS — E782 Mixed hyperlipidemia: Secondary | ICD-10-CM | POA: Diagnosis not present

## 2018-06-28 DIAGNOSIS — I25119 Atherosclerotic heart disease of native coronary artery with unspecified angina pectoris: Secondary | ICD-10-CM | POA: Diagnosis not present

## 2018-06-28 DIAGNOSIS — Z953 Presence of xenogenic heart valve: Secondary | ICD-10-CM

## 2018-06-28 DIAGNOSIS — I10 Essential (primary) hypertension: Secondary | ICD-10-CM | POA: Diagnosis not present

## 2018-06-28 NOTE — Progress Notes (Signed)
Cardiology Office Note  Date: 06/28/2018   ID: Edward Pena, DOB 08/06/1926, MRN [id number]  PCP:  [name], MD  Cardiologist:  [name], MD Electrophysiologist:  None   Chief Complaint  Patient presents with  . Coronary Artery Disease    History of Present Illness: Edward Pena is a 85 y.o. male last seen in December 2019.  He presents for a routine visit.  He does not report any angina symptoms or nitroglycerin use, remains functional with ADLs.  He reports no dizziness or syncope.  I personally reviewed his ECG today which shows sinus bradycardia with prolonged PR interval, anteroseptal Q waves.  We went over his medications which are listed below and stable from a cardiac perspective.  He has a fresh bottle of nitroglycerin.  I reviewed his lab work from November 2019 as outlined below.  Past Medical History:  Diagnosis Date  . Aortic regurgitation   . Aortic stenosis   . Blood transfusion   . Carotid artery disease (HCC)    Mild  . Coronary atherosclerosis of native coronary artery    DES LAD 10/07  . Dysphagia 02/23/2017  . Essential hypertension   . GERD (gastroesophageal reflux disease)   . Mixed hyperlipidemia   . Peptic ulcer disease     Past Surgical History:  Procedure Laterality Date  . AORTIC VALVE REPLACEMENT  03/12/2011   Procedure: AORTIC VALVE REPLACEMENT (AVR);  Surgeon: [name], MD;  Location: [facility];  Service: Open Heart Surgery;  Laterality: N/A;  Aortic Valve Replacement   . APPENDECTOMY    . CORONARY ARTERY BYPASS GRAFT  03/12/2011   Procedure: CORONARY ARTERY BYPASS GRAFTING (CABG);  Surgeon: [name], MD;  Location: [facility];  Service: Open Heart Surgery;  Laterality: N/A;  Coronary Artery Bypass Graft on pump times  two utilizing left internal mammary artery and right greater saphenous vein, harvested endoscopically.  Transesophageal echocardiogram   . ESOPHAGOGASTRODUODENOSCOPY N/A 12/12/2015   Procedure:  ESOPHAGOGASTRODUODENOSCOPY (EGD);  Surgeon: [name], MD;  [facility];  Service: Endoscopy;  Laterality: N/A;  255  . ESOPHAGOGASTRODUODENOSCOPY N/A 03/26/2017   Procedure: ESOPHAGOGASTRODUODENOSCOPY (EGD);  Surgeon: [name], MD;  [facility];  Service: Endoscopy;  Laterality: N/A;  10:40  . YAG LASER APPLICATION Left 0/01/6008   Procedure: YAG LASER APPLICATION;  Surgeon: [name], MD;  Location[facility];  Service: Ophthalmology;  Laterality: Left;  . YAG LASER APPLICATION Right 09/14/2353   Procedure: YAG LASER APPLICATION;  Surgeon: [name], MD;  Location: [facility];  Service: Ophthalmology;  Laterality: Right;    Current Outpatient Medications  Medication Sig Dispense Refill  . amLODipine-benazepril (LOTREL) 5-40 MG capsule Take 1 capsule by mouth daily.    [name] aspirin EC 81 MG tablet Take 1 tablet (81 mg total) by mouth 3 (three) times a week.    [name] atorvastatin (LIPITOR) 20 MG tablet Take 1 tablet (20 mg total) by mouth daily. 30 tablet 6  . metoprolol tartrate (LOPRESSOR) 25 MG tablet TAKE 1 TABLET THREE TIMES A DAY (Patient taking differently: Take 25 mg by mouth 3 times daily) 270 tablet 2  . Multiple Vitamin (MULTIVITAMIN) tablet Take 1 tablet by mouth daily.      . nitroGLYCERIN (NITROSTAT) 0.4 MG SL tablet Place 1 tablet (0.4 mg total) under the tongue every 5 (five) minutes x 3 doses as needed for chest pain (if no relief after 3rd  dose proceed to the ED for an evaluation). 25 tablet 3  . pantoprazole (PROTONIX) 40 MG tablet Take 1 tablet by mouth as needed.    . Polyethylene Glycol 400 (BLINK TEARS OP) Place 2 drops into both eyes daily as needed (for dry eyes).     No current facility-administered medications for this visit.    Allergies:  Patient has no known allergies.   Social History: The patient  reports that he has never smoked. He has never used smokeless tobacco. He reports current alcohol use. He reports that he does not  use drugs.   ROS:  Please see the history of present illness. Otherwise, complete review of systems is positive for hearing loss.  All other systems are reviewed and negative.   Physical Exam: VS:  BP (!) 155/57   Pulse (!) 57   Temp (!) 97.3 F (36.3 C)   Ht 5\' 10"  (1.778 m)   Wt 182 lb (82.6 kg)   SpO2 98%   BMI 26.11 kg/m , BMI Body mass index is 26.11 kg/m.  Wt Readings from Last 3 Encounters:  06/28/18 182 lb (82.6 kg)  01/10/18 174 lb 8 oz (79.2 kg)  12/30/17 177 lb 3.2 oz (80.4 kg)    General: Elderly male, appears comfortable at rest. HEENT: Conjunctiva and lids normal, oropharynx clear. Neck: Supple, no elevated JVP or carotid bruits, no thyromegaly. Lungs: Clear to auscultation, nonlabored breathing at rest. Cardiac: Regular rate and rhythm, no S3, 2/6 systolic murmur, no pericardial rub. Abdomen: Soft, nontender, bowel sounds present. Extremities: Trace ankle edema, distal pulses 2+. Skin: Warm and dry. Musculoskeletal: No kyphosis. Neuropsychiatric: Alert and oriented x3, affect grossly appropriate.  ECG:  An ECG dated 06/24/2017 was personally reviewed today and demonstrated:  Sinus bradycardia with nonspecific ST-T changes.  Recent Labwork: 04/20/2018: Hemoglobin 12.12 November 2017: Cholesterol 167, triglycerides 96, HDL 66, LDL 82, BUN 15, creatinine 0.95, potassium 4.8, AST 19, ALT, hemoglobin 12.7, platelets 235, TSH 2.21  Other Studies Reviewed Today:  Echocardiogram 12/30/2016: Study Conclusions  - Left ventricle: The cavity size was normal. Wall thickness was normal. Systolic function was vigorous. The estimated ejection fraction was in the range of 65% to 70%. Wall motion was normal; there were no regional wall motion abnormalities. Doppler parameters are consistent with abnormal left ventricular relaxation (grade 1 diastolic dysfunction). Doppler parameters are consistent with high ventricular filling pressure. - Aortic valve:  Bioprosthetic aortic valve noted. It is functioning normally. There was no stenosis. There was trivial regurgitation. Peak velocity (S): 263 cm/s. Mean gradient (S): 14 mm Hg. - Mitral valve: Moderately calcified annulus. Mildly thickened, mildly calcified leaflets . There was trivial regurgitation. - Tricuspid valve: There was mild regurgitation. - Pulmonic valve: There was mild regurgitation. - Pulmonary arteries: PA peak pressure: 44 mm Hg (S).  Assessment and Plan:  1.  CAD status post CABG in 2013.  He reports no angina on medical therapy.  Continue aspirin and statin.  ECG reviewed and stable.  2.  Aortic stenosis status post bioprosthetic AVR in 2013.  Valve function stable by echocardiogram in December 2018 with mean gradient 14 mmHg.  3.  Mixed hyperlipidemia on Lipitor.  Last LDL was 82.  Keep follow-up with Dr. Sherril CroonVyas for routine lab work in November.  Medication Adjustments/Labs and Tests Ordered: Current medicines are reviewed at length with the patient today.  Concerns regarding medicines are outlined above.   Tests Ordered: Orders Placed This Encounter  Procedures  . EKG 12-Lead  Medication Changes: No orders of the defined types were placed in this encounter.   Disposition:  Follow up 6 months in the [place] office.  Signed, Jonelle SidleSamuel G. Omeed Osuna, MD, [facility]ACC 06/28/2018 2:14 PM    [facility] [address], [place], [place] [address] Phone: [phone]) [phone]; Fax: [phone]

## 2018-06-28 NOTE — Patient Instructions (Addendum)

## 2018-09-29 ENCOUNTER — Telehealth: Payer: Self-pay | Admitting: Cardiology

## 2018-09-29 NOTE — Telephone Encounter (Signed)
°*  STAT* If patient is at the pharmacy, call can be transferred to refill team.   1. Which medications need to be refilled? (please list name of each medication and dose if known)  Amlodipine 5mg  takes 1 daily   2. Which pharmacy/location (including street and city if local pharmacy) is medication to be sent to?[facility] on [address]  3. Do they need a 30 day or 90 day supply? 90           Patient is completely out!

## 2018-09-30 DIAGNOSIS — M24542 Contracture, left hand: Secondary | ICD-10-CM | POA: Diagnosis not present

## 2018-09-30 DIAGNOSIS — I10 Essential (primary) hypertension: Secondary | ICD-10-CM | POA: Diagnosis not present

## 2018-09-30 DIAGNOSIS — Z299 Encounter for prophylactic measures, unspecified: Secondary | ICD-10-CM | POA: Diagnosis not present

## 2018-09-30 DIAGNOSIS — Z789 Other specified health status: Secondary | ICD-10-CM | POA: Diagnosis not present

## 2018-09-30 DIAGNOSIS — Z6828 Body mass index (BMI) 28.0-28.9, adult: Secondary | ICD-10-CM | POA: Diagnosis not present

## 2018-09-30 DIAGNOSIS — M25552 Pain in left hip: Secondary | ICD-10-CM | POA: Diagnosis not present

## 2018-09-30 DIAGNOSIS — I2510 Atherosclerotic heart disease of native coronary artery without angina pectoris: Secondary | ICD-10-CM | POA: Diagnosis not present

## 2018-09-30 NOTE — Telephone Encounter (Signed)
Pt sees Dr Domenic Polite

## 2018-09-30 NOTE — Telephone Encounter (Signed)
Spoke with patient and he says he did not need any refills at this time. Says he has 3 RF's on lotrel. Confirmed that [facility] uses [facility].

## 2018-10-05 DIAGNOSIS — R200 Anesthesia of skin: Secondary | ICD-10-CM | POA: Diagnosis not present

## 2018-10-05 DIAGNOSIS — M720 Palmar fascial fibromatosis [Dupuytren]: Secondary | ICD-10-CM | POA: Diagnosis not present

## 2018-10-12 DIAGNOSIS — G5623 Lesion of ulnar nerve, bilateral upper limbs: Secondary | ICD-10-CM | POA: Diagnosis not present

## 2018-10-12 DIAGNOSIS — G5603 Carpal tunnel syndrome, bilateral upper limbs: Secondary | ICD-10-CM | POA: Diagnosis not present

## 2018-10-18 ENCOUNTER — Other Ambulatory Visit: Payer: Self-pay | Admitting: Orthopedic Surgery

## 2018-11-04 ENCOUNTER — Encounter (HOSPITAL_BASED_OUTPATIENT_CLINIC_OR_DEPARTMENT_OTHER): Payer: Self-pay | Admitting: *Deleted

## 2018-11-04 ENCOUNTER — Other Ambulatory Visit: Payer: Self-pay

## 2018-11-07 ENCOUNTER — Other Ambulatory Visit (HOSPITAL_COMMUNITY)
Admission: RE | Admit: 2018-11-07 | Discharge: 2018-11-07 | Disposition: A | Discharge status: Home / Self Care | Payer: Medicare HMO | Source: Ambulatory Visit | Attending: Orthopedic Surgery | Admitting: Orthopedic Surgery

## 2018-11-07 ENCOUNTER — Other Ambulatory Visit (HOSPITAL_COMMUNITY): Payer: Medicare HMO

## 2018-11-07 DIAGNOSIS — Z20828 Contact with and (suspected) exposure to other viral communicable diseases: Secondary | ICD-10-CM | POA: Diagnosis not present

## 2018-11-07 DIAGNOSIS — Z01812 Encounter for preprocedural laboratory examination: Secondary | ICD-10-CM | POA: Diagnosis not present

## 2018-11-07 LAB — SARS CORONAVIRUS 2 (TAT 6-24 HRS): SARS Coronavirus 2: NEGATIVE

## 2018-11-10 ENCOUNTER — Ambulatory Visit (HOSPITAL_BASED_OUTPATIENT_CLINIC_OR_DEPARTMENT_OTHER)
Admission: RE | Admit: 2018-11-10 | Discharge: 2018-11-10 | Disposition: A | Discharge status: Home / Self Care | Payer: Medicare HMO | Source: Ambulatory Visit | Attending: Orthopedic Surgery | Admitting: Orthopedic Surgery

## 2018-11-10 ENCOUNTER — Ambulatory Visit (HOSPITAL_BASED_OUTPATIENT_CLINIC_OR_DEPARTMENT_OTHER): Payer: Medicare HMO | Admitting: Certified Registered"

## 2018-11-10 ENCOUNTER — Encounter (HOSPITAL_BASED_OUTPATIENT_CLINIC_OR_DEPARTMENT_OTHER): Payer: Self-pay

## 2018-11-10 ENCOUNTER — Encounter (HOSPITAL_BASED_OUTPATIENT_CLINIC_OR_DEPARTMENT_OTHER)
Admission: RE | Disposition: A | Discharge status: Home / Self Care | Payer: Self-pay | Source: Ambulatory Visit | Attending: Orthopedic Surgery

## 2018-11-10 ENCOUNTER — Other Ambulatory Visit: Payer: Self-pay

## 2018-11-10 DIAGNOSIS — E782 Mixed hyperlipidemia: Secondary | ICD-10-CM | POA: Insufficient documentation

## 2018-11-10 DIAGNOSIS — K219 Gastro-esophageal reflux disease without esophagitis: Secondary | ICD-10-CM | POA: Diagnosis not present

## 2018-11-10 DIAGNOSIS — I2510 Atherosclerotic heart disease of native coronary artery without angina pectoris: Secondary | ICD-10-CM | POA: Insufficient documentation

## 2018-11-10 DIAGNOSIS — G5622 Lesion of ulnar nerve, left upper limb: Secondary | ICD-10-CM | POA: Diagnosis not present

## 2018-11-10 DIAGNOSIS — Z952 Presence of prosthetic heart valve: Secondary | ICD-10-CM | POA: Insufficient documentation

## 2018-11-10 DIAGNOSIS — Z951 Presence of aortocoronary bypass graft: Secondary | ICD-10-CM | POA: Insufficient documentation

## 2018-11-10 DIAGNOSIS — I10 Essential (primary) hypertension: Secondary | ICD-10-CM | POA: Diagnosis not present

## 2018-11-10 DIAGNOSIS — Z955 Presence of coronary angioplasty implant and graft: Secondary | ICD-10-CM | POA: Insufficient documentation

## 2018-11-10 DIAGNOSIS — G5602 Carpal tunnel syndrome, left upper limb: Secondary | ICD-10-CM | POA: Insufficient documentation

## 2018-11-10 DIAGNOSIS — M720 Palmar fascial fibromatosis [Dupuytren]: Secondary | ICD-10-CM | POA: Diagnosis not present

## 2018-11-10 DIAGNOSIS — I739 Peripheral vascular disease, unspecified: Secondary | ICD-10-CM | POA: Insufficient documentation

## 2018-11-10 HISTORY — PX: CARPAL TUNNEL RELEASE: SHX101

## 2018-11-10 HISTORY — PX: FASCIECTOMY: SHX6525

## 2018-11-10 SURGERY — CARPAL TUNNEL RELEASE
Anesthesia: Regional | Site: Wrist | Laterality: Left

## 2018-11-10 MED ORDER — MIDAZOLAM HCL 2 MG/2ML IJ SOLN: 1.0000 mg | INTRAMUSCULAR | Status: DC | PRN

## 2018-11-10 MED ORDER — LACTATED RINGERS IV SOLN
INTRAVENOUS | Status: DC
  Administered 2018-11-10: 08:00:00 via INTRAVENOUS

## 2018-11-10 MED ORDER — PHENYLEPHRINE HCL (PRESSORS) 10 MG/ML IV SOLN
INTRAVENOUS | Status: DC | PRN
  Administered 2018-11-10 (×2): 40 ug via INTRAVENOUS

## 2018-11-10 MED ORDER — OXYCODONE HCL 5 MG/5ML PO SOLN: 5.0000 mg | Freq: Once | ORAL | Status: DC | PRN

## 2018-11-10 MED ORDER — FENTANYL CITRATE (PF) 100 MCG/2ML IJ SOLN: 25.0000 ug | INTRAMUSCULAR | Status: DC | PRN

## 2018-11-10 MED ORDER — BUPIVACAINE-EPINEPHRINE (PF) 0.5% -1:200000 IJ SOLN
INTRAMUSCULAR | Status: DC | PRN
  Administered 2018-11-10: 30 mL via PERINEURAL

## 2018-11-10 MED ORDER — PROPOFOL 10 MG/ML IV BOLUS
INTRAVENOUS | Status: AC
  Filled 2018-11-10: qty 20

## 2018-11-10 MED ORDER — FENTANYL CITRATE (PF) 100 MCG/2ML IJ SOLN
INTRAMUSCULAR | Status: AC
  Filled 2018-11-10: qty 2

## 2018-11-10 MED ORDER — PROPOFOL 500 MG/50ML IV EMUL
INTRAVENOUS | Status: AC
  Filled 2018-11-10: qty 200

## 2018-11-10 MED ORDER — PROPOFOL 10 MG/ML IV BOLUS
INTRAVENOUS | Status: DC | PRN
  Administered 2018-11-10: 110 mg via INTRAVENOUS

## 2018-11-10 MED ORDER — CHLORHEXIDINE GLUCONATE 4 % EX LIQD: 60.0000 mL | Freq: Once | CUTANEOUS | Status: DC

## 2018-11-10 MED ORDER — FENTANYL CITRATE (PF) 100 MCG/2ML IJ SOLN
50.0000 ug | INTRAMUSCULAR | Status: DC | PRN
  Administered 2018-11-10: 08:00:00 50 ug via INTRAVENOUS

## 2018-11-10 MED ORDER — CEFAZOLIN SODIUM-DEXTROSE 2-4 GM/100ML-% IV SOLN
INTRAVENOUS | Status: AC
  Filled 2018-11-10: qty 100

## 2018-11-10 MED ORDER — PHENYLEPHRINE HCL-NACL 10-0.9 MG/250ML-% IV SOLN
INTRAVENOUS | Status: DC | PRN
  Administered 2018-11-10: 50 ug/min via INTRAVENOUS

## 2018-11-10 MED ORDER — TRAMADOL HCL 50 MG PO TABS: 50.0000 mg | ORAL_TABLET | Freq: Four times a day (QID) | ORAL | 0 refills | Status: DC | PRN

## 2018-11-10 MED ORDER — DEXAMETHASONE SODIUM PHOSPHATE 10 MG/ML IJ SOLN
INTRAMUSCULAR | Status: DC | PRN
  Administered 2018-11-10: 5 mg via INTRAVENOUS

## 2018-11-10 MED ORDER — ONDANSETRON HCL 4 MG/2ML IJ SOLN: 4.0000 mg | Freq: Once | INTRAMUSCULAR | Status: DC | PRN

## 2018-11-10 MED ORDER — MIDAZOLAM HCL 2 MG/2ML IJ SOLN
INTRAMUSCULAR | Status: AC
  Filled 2018-11-10: qty 2

## 2018-11-10 MED ORDER — EPHEDRINE SULFATE 50 MG/ML IJ SOLN
INTRAMUSCULAR | Status: DC | PRN
  Administered 2018-11-10 (×3): 10 mg via INTRAVENOUS

## 2018-11-10 MED ORDER — CEFAZOLIN SODIUM-DEXTROSE 2-4 GM/100ML-% IV SOLN
2.0000 g | INTRAVENOUS | Status: AC
  Administered 2018-11-10: 2 g via INTRAVENOUS

## 2018-11-10 MED ORDER — LIDOCAINE 2% (20 MG/ML) 5 ML SYRINGE
INTRAMUSCULAR | Status: DC | PRN
  Administered 2018-11-10: 40 mg via INTRAVENOUS

## 2018-11-10 MED ORDER — OXYCODONE HCL 5 MG PO TABS: 5.0000 mg | ORAL_TABLET | Freq: Once | ORAL | Status: DC | PRN

## 2018-11-10 MED ORDER — ONDANSETRON HCL 4 MG/2ML IJ SOLN
INTRAMUSCULAR | Status: DC | PRN
  Administered 2018-11-10: 4 mg via INTRAVENOUS

## 2018-11-10 SURGICAL SUPPLY — 48 items
BLADE MINI RND TIP GREEN BEAV (BLADE) ×4 IMPLANT
BLADE SURG 15 STRL LF DISP TIS (BLADE) ×2 IMPLANT
BLADE SURG 15 STRL SS (BLADE) ×2
BNDG COHESIVE 3X5 TAN STRL LF (GAUZE/BANDAGES/DRESSINGS) ×4 IMPLANT
BNDG ESMARK 4X9 LF (GAUZE/BANDAGES/DRESSINGS) ×4 IMPLANT
BNDG GAUZE ELAST 4 BULKY (GAUZE/BANDAGES/DRESSINGS) ×4 IMPLANT
CHLORAPREP W/TINT 26 (MISCELLANEOUS) ×4 IMPLANT
CORD BIPOLAR FORCEPS 12FT (ELECTRODE) ×4 IMPLANT
COVER BACK TABLE REUSABLE LG (DRAPES) ×4 IMPLANT
COVER MAYO STAND REUSABLE (DRAPES) ×4 IMPLANT
COVER WAND RF STERILE (DRAPES) IMPLANT
CUFF TOURN SGL QUICK 18X4 (TOURNIQUET CUFF) ×4 IMPLANT
DECANTER SPIKE VIAL GLASS SM (MISCELLANEOUS) IMPLANT
DRAPE EXTREMITY T 121X128X90 (DISPOSABLE) ×4 IMPLANT
DRAPE SURG 17X23 STRL (DRAPES) ×4 IMPLANT
DRSG PAD ABDOMINAL 8X10 ST (GAUZE/BANDAGES/DRESSINGS) ×4 IMPLANT
GAUZE SPONGE 4X4 12PLY STRL (GAUZE/BANDAGES/DRESSINGS) ×4 IMPLANT
GAUZE XEROFORM 1X8 LF (GAUZE/BANDAGES/DRESSINGS) ×4 IMPLANT
GLOVE BIO SURGEON STRL SZ 6.5 (GLOVE) ×1 IMPLANT
GLOVE BIO SURGEONS STRL SZ 6.5 (GLOVE) ×1
GLOVE BIOGEL PI IND STRL 7.0 (GLOVE) IMPLANT
GLOVE BIOGEL PI IND STRL 8.5 (GLOVE) ×2 IMPLANT
GLOVE BIOGEL PI INDICATOR 7.0 (GLOVE) ×4
GLOVE BIOGEL PI INDICATOR 8.5 (GLOVE) ×2
GLOVE SURG ORTHO 8.0 STRL STRW (GLOVE) ×4 IMPLANT
GOWN STRL REUS W/ TWL LRG LVL3 (GOWN DISPOSABLE) ×2 IMPLANT
GOWN STRL REUS W/TWL LRG LVL3 (GOWN DISPOSABLE) ×2
GOWN STRL REUS W/TWL XL LVL3 (GOWN DISPOSABLE) ×4 IMPLANT
LOOP VESSEL MAXI BLUE (MISCELLANEOUS) ×4 IMPLANT
NDL PRECISIONGLIDE 27X1.5 (NEEDLE) ×2 IMPLANT
NEEDLE PRECISIONGLIDE 27X1.5 (NEEDLE) ×4 IMPLANT
NS IRRIG 1000ML POUR BTL (IV SOLUTION) ×4 IMPLANT
PACK BASIN DAY SURGERY FS (CUSTOM PROCEDURE TRAY) ×4 IMPLANT
PAD CAST 3X4 CTTN HI CHSV (CAST SUPPLIES) ×2 IMPLANT
PADDING CAST ABS 4INX4YD NS (CAST SUPPLIES) ×2
PADDING CAST ABS COTTON 4X4 ST (CAST SUPPLIES) ×2 IMPLANT
PADDING CAST COTTON 3X4 STRL (CAST SUPPLIES) ×2
SLEEVE SCD COMPRESS KNEE MED (MISCELLANEOUS) ×4 IMPLANT
SPLINT PLASTER CAST XFAST 3X15 (CAST SUPPLIES) IMPLANT
SPLINT PLASTER XTRA FASTSET 3X (CAST SUPPLIES) ×20
STOCKINETTE 4X48 STRL (DRAPES) ×4 IMPLANT
SUT ETHILON 4 0 PS 2 18 (SUTURE) ×4 IMPLANT
SUT SILK 2 0 PERMA HAND 18 BK (SUTURE) IMPLANT
SUT VICRYL 4-0 PS2 18IN ABS (SUTURE) IMPLANT
SYR BULB 3OZ (MISCELLANEOUS) ×4 IMPLANT
SYR CONTROL 10ML LL (SYRINGE) ×4 IMPLANT
TOWEL GREEN STERILE FF (TOWEL DISPOSABLE) ×8 IMPLANT
UNDERPAD 30X36 HEAVY ABSORB (UNDERPADS AND DIAPERS) ×4 IMPLANT

## 2018-11-10 NOTE — Brief Op Note (Signed)
11/10/2018  9:56 AM  PATIENT:  Edward Pena  85 y.o. male  PRE-OPERATIVE DIAGNOSIS:  DUPUYTREN'S  LEFT MIDDLE AND RING FINGERS, LEFT CARPAL TUNNEL SYNDROME, LEFT GUYONS CANAL  POST-OPERATIVE DIAGNOSIS:  * No post-op diagnosis entered *  PROCEDURE:  Procedure(s) with comments: LEFT CARPAL TUNNEL RELEASE (Left) - AXILLARY BLOCK LEFT GUYON'S CANAL RELEASE (Left) SEGMENTAL FASCIECTOMY LEFT RING FINGER AND LEFT MIDDLE FINGER (Left)  SURGEON:  Surgeon(s) and Role:    Daryll Brod, MD - Primary  PHYSICIAN ASSISTANT:   ASSISTANTS: none   ANESTHESIA:   regional and IV sedation  EBL:  6ml   BLOOD ADMINISTERED:none  DRAINS: none   LOCAL MEDICATIONS USED:  NONE  SPECIMEN:  Excision  DISPOSITION OF SPECIMEN:  PATHOLOGY  COUNTS:  YES  TOURNIQUET:   Total Tourniquet Time Documented: Upper Arm (Left) - 48 minutes Total: Upper Arm (Left) - 48 minutes   DICTATION: .Viviann Spare Dictation  PLAN OF CARE: Discharge to home after PACU  PATIENT DISPOSITION:  PACU - hemodynamically stable.

## 2018-11-10 NOTE — Anesthesia Preprocedure Evaluation (Addendum)
Anesthesia Evaluation  Patient identified by MRN, date of birth, ID band Patient awake    Reviewed: Allergy & Precautions, NPO status , Patient's Chart, lab work & pertinent test results, reviewed documented beta blocker date and time   History of Anesthesia Complications Negative for: history of anesthetic complications  Airway Mallampati: II  TM Distance: >3 FB Neck ROM: Full    Dental  (+) Dental Advisory Given, Edentulous Upper   Pulmonary neg pulmonary ROS,    Pulmonary exam normal        Cardiovascular hypertension, Pt. on medications and Pt. on home beta blockers + CAD, + CABG and + Peripheral Vascular Disease  Normal cardiovascular exam+ Valvular Problems/Murmurs (s/p AVR)    '18 TTE - EF 65% to 70%. Grade 1 diastolic dysfunction. Bioprosthetic aortic valve noted. It is functioning normally. Trivial AI. Trivial MR, mild TR and PR. PASP: 44 mm Hg    Neuro/Psych negative neurological ROS  negative psych ROS   GI/Hepatic Neg liver ROS, PUD, GERD  Medicated and Controlled,  Endo/Other  negative endocrine ROS  Renal/GU negative Renal ROS     Musculoskeletal negative musculoskeletal ROS (+)   Abdominal   Peds  Hematology negative hematology ROS (+)   Anesthesia Other Findings   Reproductive/Obstetrics                            Anesthesia Physical Anesthesia Plan  ASA: III  Anesthesia Plan: Regional   Post-op Pain Management:    Induction: Intravenous  PONV Risk Score and Plan: 1 and Propofol infusion and Treatment may vary due to age or medical condition  Airway Management Planned: Natural Airway and Simple Face Mask  Additional Equipment: None  Intra-op Plan:   Post-operative Plan:   Informed Consent: I have reviewed the patients History and Physical, chart, labs and discussed the procedure including the risks, benefits and alternatives for the proposed anesthesia with  the patient or authorized representative who has indicated his/her understanding and acceptance.       Plan Discussed with: CRNA and Anesthesiologist  Anesthesia Plan Comments: (LMA as backup to MAC if block without complete sensory blockade)       Anesthesia Quick Evaluation

## 2018-11-10 NOTE — Op Note (Signed)
NAME: Edward Pena MEDICAL RECORD NO: [id number] DATE OF BIRTH: 12/19/1926 FACILITY: [facility] LOCATION: [facility] PHYSICIAN: Wynonia Sours, MD   OPERATIVE REPORT   DATE OF PROCEDURE: 11/10/18    PREOPERATIVE DIAGNOSIS:   Dupuytren's contracture middle ring fingers left hand with carpal tunnel and Guyon's canal compression median and ulnar nerve left wrist   POSTOPERATIVE DIAGNOSIS:   Same   PROCEDURE:   Release of median nerve at the wrist and Guyon's canal at the wrist ulnar nerve.  Segmental fasciectomy middle and ring fingers left hand   SURGEON: Daryll Brod, M.D.   ASSISTANT: none   ANESTHESIA:  Regional with sedation   INTRAVENOUS FLUIDS:  Per anesthesia flow sheet.   ESTIMATED BLOOD LOSS:  Minimal.   COMPLICATIONS:  None.   SPECIMENS:   Palmar fascia   TOURNIQUET TIME:    Total Tourniquet Time Documented: Upper Arm (Left) - 48 minutes Total: Upper Arm (Left) - 48 minutes    DISPOSITION:  Stable to PACU.   INDICATIONS: Patient is a 85 year old male with a history of contracture of his middle and ring fingers with numbness and tingling in his hand atrophy to the thenar intrinsics.  Nerve conductions are positive for compression of the median and ulnar nerve at the wrist.  He has Dupuytren's cords with flexion deformity to the PIP joint of the middle and flexion deformity MP joint of the ring.  He has elected undergo release of the median and ulnar nerve at the wrist and fasciectomy segmental in nature to the middle and ring fingers left hand.  Preperi-and postoperative course been discussed along with risks and complications.  He is aware there is no guarantee to the surgery the possibility of infection recurrence injury to arteries nerves tendons complete relief symptoms and dystrophy.  In the preoperative area the patient is seen the extremity marked by both patient and surgeon antibiotic given  OPERATIVE COURSE: Patient is brought to the operating room  after supraclavicular block was carried out without difficulty under the direction the anesthesia department.  He was prepped using ChloraPrep in a supine position with the left arm free.  He continued to have some feeling in the ulnar nerve distribution LMA was then performed by anesthesia.  A 3-minute dry time was allowed and a timeout taken to confirm patient procedure.  The limb was exsanguinated with an Esmarch bandage turn placed high in the arm was inflated to 250 mmHg.  A longitudinal incision was made left palm carried down through subcutaneous tissue.  Bleeders were electrocauterized with bipolar.  The palmar fascia was split.  Superficial palmar arch was identified the flexor tendon the ring little finger was identified the flexor retinaculum was then released on its ulnar border after retractors were placed retracting median nerve radially and ulnar nerve ulnarly.  The distal forearm fascia was then released proximally after dissecting deep structures with blunt scissors.  Right angle and sewell retractor were then placed and a release of the distal forearm fascia was then performed for approximately 2 to 3 cm proximal to the wrist crease under direct vision.  The motor branch was noted underneath the muscle distally and area compression of the nerve was apparent.  The superficial arch was then identified this was traced proximally releasing the superficial portion of the Guyon's canal.  The motor branch was noted to enter into the hypothenar musculature.  This was present then released from the hamate hook with blunt sharp dissection after protecting the  deep branch of the ulnar nerve.  This was all along its entire course.  No further lesions were identified an area of banding was present in the deep branch with fascial fibers to the hypothenar musculature which were released.  Wound was copiously irrigated with saline.  The skin was closed interrupted 4-0 nylon sutures.  A transverse incision was  then made over the proximal aspect of the palmar fascia distal to the longitudinal incision.  This allowed visualization of the palmar fascia distally took off from the flexor retinaculum.  The nerves arteries were dissected deeply.  These were protected.  The palmar fascia to the middle ring finger was then excised.  Protecting neurovascular bundles deeply.  The second incision was made distal to this approximately a centimeter and a half.  This allowed visualization of the cord distally.  Neurovascular structures were identified deep to it and the remainder of the fascia from the first incision was then brought beneath the skin elevated and excised and sent to pathology.  This allowed full extension of the ring finger.  A separate incision was then made over the metacarpal phalangeal joint area of the middle finger.  This allowed visualization of the cord which was excised.  This was done protecting the neurovascular structures deeply.  This allowed a lateral digital sheet cord to be identified on the middle finger ulnar side.  A transverse incision was then made over the area.  The neurovascular bundle was identified deep to this.  Retractors were placed retracting the neurovascular structures in the lateral digital sheath was isolated and portion excised allowing the PIP joint to come fully straight.  Each of the wounds were irrigated.  The most proximal incision was able to be closed with interrupted 4-0 nylon sutures and the proximal incision on the middle finger was also able to be closed with interrupted 4-0 nylon the 2 remaining incisions were left open due to insufficient skin.  Each of the wounds was irrigated sterile compressive dressing over nonadherent gauze was placed.  The tourniquet deflated all fingers immediately pink dorsal splint was applied the patient tolerated the procedure well and was taken to the recovery room for observation in satisfactory condition.  He will be discharged home to [place] in 1 week not Tylenol ibuprofen for pain with Ultram for backup breakthrough.   Cindee Salt, MD Electronically signed, 11/10/18

## 2018-11-10 NOTE — Transfer of Care (Signed)
Immediate Anesthesia Transfer of Care Note  Patient: Geovannie L Kabat  Procedure(s) Performed: LEFT CARPAL TUNNEL RELEASE (Left Wrist) LEFT GUYON'S CANAL RELEASE (Left Wrist) SEGMENTAL FASCIECTOMY LEFT RING FINGER AND LEFT MIDDLE FINGER (Left Hand)  Patient Location: PACU  Anesthesia Type:GA combined with regional for post-op pain  Level of Consciousness: awake, alert  and oriented  Airway & Oxygen Therapy: Patient Spontanous Breathing and Patient connected to face mask oxygen  Post-op Assessment: Report given to RN and Post -op Vital signs reviewed and stable  Post vital signs: Reviewed and stable  Last Vitals:  Vitals Value Taken Time  BP    Temp    Pulse 81 11/10/18 0958  Resp    SpO2 100 % 11/10/18 0958  Vitals shown include unvalidated device data.  Last Pain:  Vitals:   11/10/18 0741  TempSrc: Oral  PainSc: 0-No pain         Complications: No apparent anesthesia complications

## 2018-11-10 NOTE — Anesthesia Procedure Notes (Signed)
Anesthesia Regional Block: Axillary brachial plexus block   Pre-Anesthetic Checklist: ,, timeout performed, Correct Patient, Correct Site, Correct Laterality, Correct Procedure, Correct Position, site marked, Risks and benefits discussed,  Surgical consent,  Pre-op evaluation,  At surgeon's request and post-op pain management  Laterality: Left  Prep: chloraprep       Needles:  Injection technique: Single-shot  Needle Type: Echogenic Needle     Needle Length: 5cm  Needle Gauge: 21     Additional Needles:   Narrative:  Start time: 11/10/2018 8:02 AM End time: 11/10/2018 8:07 AM Injection made incrementally with aspirations every 5 mL.  Performed by: Personally  Anesthesiologist: Audry Pili, MD  Additional Notes: No pain on injection. No increased resistance to injection. Injection made in 5cc increments. Good needle visualization. Patient tolerated the procedure well.

## 2018-11-10 NOTE — Anesthesia Procedure Notes (Signed)
Procedure Name: LMA Insertion Date/Time: 11/10/2018 8:48 AM Performed by: Lavonia Dana, CRNA Pre-anesthesia Checklist: Patient identified, Emergency Drugs available, Suction available and Patient being monitored Patient Re-evaluated:Patient Re-evaluated prior to induction Oxygen Delivery Method: Circle system utilized Preoxygenation: Pre-oxygenation with 100% oxygen Induction Type: IV induction Ventilation: Mask ventilation without difficulty LMA: LMA inserted LMA Size: 5.0 Number of attempts: 1 Airway Equipment and Method: Bite block Placement Confirmation: positive ETCO2 Tube secured with: Tape Dental Injury: Teeth and Oropharynx as per pre-operative assessment

## 2018-11-10 NOTE — Progress Notes (Signed)
Assisted Dr. Brock with left, ultrasound guided, axillary block. Side rails up, monitors on throughout procedure. See vital signs in flow sheet. Tolerated Procedure well. 
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
## 2018-11-10 NOTE — Anesthesia Postprocedure Evaluation (Signed)
Anesthesia Post Note  Patient: Edward Pena  Procedure(s) Performed: LEFT CARPAL TUNNEL RELEASE (Left Wrist) LEFT GUYON'S CANAL RELEASE (Left Wrist) SEGMENTAL FASCIECTOMY LEFT RING FINGER AND LEFT MIDDLE FINGER (Left Hand)     Patient location during evaluation: PACU Anesthesia Type: General Level of consciousness: awake and alert Pain management: pain level controlled Vital Signs Assessment: post-procedure vital signs reviewed and stable Respiratory status: spontaneous breathing, nonlabored ventilation and respiratory function stable Cardiovascular status: blood pressure returned to baseline and stable Postop Assessment: no apparent nausea or vomiting Anesthetic complications: no    Last Vitals:  Vitals:   11/10/18 1030 11/10/18 1101  BP: (!) 160/87 (!) 170/63  Pulse: 71 71  Resp: 18 16  Temp:  36.9 C  SpO2: 94% 98%    Last Pain:  Vitals:   11/10/18 1101  TempSrc:   PainSc: 0-No pain                 Audry Pili

## 2018-11-10 NOTE — Discharge Instructions (Addendum)

## 2018-11-11 LAB — SURGICAL PATHOLOGY

## 2018-11-14 ENCOUNTER — Encounter (HOSPITAL_BASED_OUTPATIENT_CLINIC_OR_DEPARTMENT_OTHER): Payer: Self-pay | Admitting: Orthopedic Surgery

## 2018-12-12 DIAGNOSIS — Z0000 Encounter for general adult medical examination without abnormal findings: Secondary | ICD-10-CM | POA: Diagnosis not present

## 2018-12-12 DIAGNOSIS — Z7189 Other specified counseling: Secondary | ICD-10-CM | POA: Diagnosis not present

## 2018-12-12 DIAGNOSIS — Z6827 Body mass index (BMI) 27.0-27.9, adult: Secondary | ICD-10-CM | POA: Diagnosis not present

## 2018-12-12 DIAGNOSIS — Z1211 Encounter for screening for malignant neoplasm of colon: Secondary | ICD-10-CM | POA: Diagnosis not present

## 2018-12-12 DIAGNOSIS — Z299 Encounter for prophylactic measures, unspecified: Secondary | ICD-10-CM | POA: Diagnosis not present

## 2018-12-12 DIAGNOSIS — Z125 Encounter for screening for malignant neoplasm of prostate: Secondary | ICD-10-CM | POA: Diagnosis not present

## 2018-12-12 DIAGNOSIS — R5383 Other fatigue: Secondary | ICD-10-CM | POA: Diagnosis not present

## 2018-12-12 DIAGNOSIS — I10 Essential (primary) hypertension: Secondary | ICD-10-CM | POA: Diagnosis not present

## 2018-12-12 DIAGNOSIS — E7800 Pure hypercholesterolemia, unspecified: Secondary | ICD-10-CM | POA: Diagnosis not present

## 2018-12-12 DIAGNOSIS — Z79899 Other long term (current) drug therapy: Secondary | ICD-10-CM | POA: Diagnosis not present

## 2018-12-12 DIAGNOSIS — Z1339 Encounter for screening examination for other mental health and behavioral disorders: Secondary | ICD-10-CM | POA: Diagnosis not present

## 2018-12-12 DIAGNOSIS — Z1331 Encounter for screening for depression: Secondary | ICD-10-CM | POA: Diagnosis not present

## 2018-12-20 ENCOUNTER — Encounter: Payer: Self-pay | Admitting: *Deleted

## 2018-12-20 ENCOUNTER — Other Ambulatory Visit: Payer: Self-pay

## 2018-12-20 ENCOUNTER — Encounter: Payer: Self-pay | Admitting: Cardiology

## 2018-12-20 ENCOUNTER — Ambulatory Visit (INDEPENDENT_AMBULATORY_CARE_PROVIDER_SITE_OTHER): Payer: Medicare HMO | Admitting: Cardiology

## 2018-12-20 VITALS — BP 161/58 | HR 50 | Ht 70.0 in | Wt 175.6 lb

## 2018-12-20 DIAGNOSIS — I10 Essential (primary) hypertension: Secondary | ICD-10-CM

## 2018-12-20 DIAGNOSIS — E782 Mixed hyperlipidemia: Secondary | ICD-10-CM | POA: Diagnosis not present

## 2018-12-20 DIAGNOSIS — E871 Hypo-osmolality and hyponatremia: Secondary | ICD-10-CM | POA: Diagnosis not present

## 2018-12-20 DIAGNOSIS — I25119 Atherosclerotic heart disease of native coronary artery with unspecified angina pectoris: Secondary | ICD-10-CM | POA: Diagnosis not present

## 2018-12-20 NOTE — Progress Notes (Signed)
Cardiology Office Note  Date: 12/20/2018   ID: Edward Croissantlmer L Pena, DOB 04-Aug-1926, MRN [id number]  PCP:  Edward Pena, Edward B, MD  Cardiologist:  Edward DellSamuel Leigh Kaeding, MD Electrophysiologist:  None   Chief Complaint  Patient presents with   Cardiac follow-up    History of Present Illness: Edward Pena is a 85 y.o. male last seen in June.  He is here with his wife for a routine visit.  He does not report any angina or nitroglycerin use.  He and his wife still live in their own home and remains functional with ADLs.  I reviewed his medications.  Cardiac regimen includes aspirin, Lipitor, Lopressor, Lotrel, and as needed nitroglycerin.  I went over his recent lab work obtained per PCP as outlined below.  LDL 76.  Past Medical History:  Diagnosis Date   Aortic regurgitation    Aortic stenosis    Blood transfusion    Carotid artery disease (HCC)    Mild   Coronary atherosclerosis of native coronary artery    DES LAD 10/07   Dysphagia 02/23/2017   Essential hypertension    GERD (gastroesophageal reflux disease)    Mixed hyperlipidemia    Peptic ulcer disease     Past Surgical History:  Procedure Laterality Date   AORTIC VALVE REPLACEMENT  03/12/2011   Procedure: AORTIC VALVE REPLACEMENT (AVR);  Surgeon: Edward NationsPeter Van Suann Larryrigt III, MD;  [facility];  Service: Open Heart Surgery;  Laterality: N/A;  Aortic Valve Replacement    APPENDECTOMY     CARPAL TUNNEL RELEASE Left 11/10/2018   Procedure: LEFT CARPAL TUNNEL RELEASE;  Surgeon: Edward SaltKuzma, Gary, MD;  Location: [facility];  Service: Orthopedics;  Laterality: Left;  AXILLARY BLOCK   CORONARY ARTERY BYPASS GRAFT  03/12/2011   Procedure: CORONARY ARTERY BYPASS GRAFTING (CABG);  Surgeon: Edward NationsPeter Van Suann Larryrigt III, MD;  [facility];  Service: Open Heart Surgery;  Laterality: N/A;  Coronary Artery Bypass Graft on pump times  two utilizing left internal mammary artery and right greater saphenous vein, harvested endoscopically.   Transesophageal echocardiogram    ESOPHAGOGASTRODUODENOSCOPY N/A 12/12/2015   Procedure: ESOPHAGOGASTRODUODENOSCOPY (EGD);  Surgeon: Edward HippoNajeeb Pena Rehman, MD;  [facility];  Service: Endoscopy;  Laterality: N/A;  255   ESOPHAGOGASTRODUODENOSCOPY N/A 03/26/2017   Procedure: ESOPHAGOGASTRODUODENOSCOPY (EGD);  Surgeon: Edward Hippoehman, Najeeb U, MD;  [facility];  Service: Endoscopy;  Laterality: N/A;  10:40   FASCIECTOMY Left 11/10/2018   Procedure: SEGMENTAL FASCIECTOMY LEFT RING FINGER AND LEFT MIDDLE FINGER;  Surgeon: Edward SaltKuzma, Gary, MD;  Location: [facility];  Service: Orthopedics;  Laterality: Left;   YAG LASER APPLICATION Left 01/15/2014   Procedure: YAG LASER APPLICATION;  Surgeon: Edward Simmondsarroll F Haines, MD;  Location: [facility];  Service: Ophthalmology;  Laterality: Left;   YAG LASER APPLICATION Right 02/12/2014   Procedure: YAG LASER APPLICATION;  Surgeon: Edward Simmondsarroll F Haines, MD;  Location: [facility];  Service: Ophthalmology;  Laterality: Right;    Current Outpatient Medications  Medication Sig Dispense Refill   amLODipine-benazepril (LOTREL) 5-40 MG capsule Take 1 capsule by mouth daily.     aspirin EC 81 MG tablet Take 1 tablet (81 mg total) by mouth 3 (three) times a week.     atorvastatin (LIPITOR) 20 MG tablet Take 1 tablet (20 mg total) by mouth daily. 30 tablet 6   metoprolol tartrate (LOPRESSOR) 25 MG tablet TAKE 1 TABLET THREE TIMES A DAY (Patient taking differently: Take 25 mg by mouth 3 times  daily) 270 tablet 2   Multiple Vitamin (MULTIVITAMIN) tablet Take 1 tablet by mouth daily.       nitroGLYCERIN (NITROSTAT) 0.4 MG SL tablet Place 1 tablet (0.4 mg total) under the tongue every 5 (five) minutes x 3 doses as needed for chest pain (if no relief after 3rd dose proceed to the ED for an evaluation). 25 tablet 3   pantoprazole (PROTONIX) 40 MG tablet Take 1 tablet by mouth as needed.     Polyethylene Glycol 400 (BLINK TEARS OP) Place 2 drops into both eyes daily  as needed (for dry eyes).     No current facility-administered medications for this visit.    Allergies:  Patient has no known allergies.   Social History: The patient  reports that he has never smoked. He has never used smokeless tobacco. He reports current alcohol use. He reports that he does not use drugs.   ROS:  Please see the history of present illness. Otherwise, complete review of systems is positive for hearing loss, interval left hand surgery.  All other systems are reviewed and negative.   Physical Exam: VS:  BP (!) 161/58    Pulse (!) 50    Ht 5\' 10"  (1.778 m)    Wt 175 lb 9.6 oz (79.7 kg)    SpO2 98%    BMI 25.20 kg/m , BMI Body mass index is 25.2 kg/m.  Wt Readings from Last 3 Encounters:  12/20/18 175 lb 9.6 oz (79.7 kg)  11/10/18 179 lb 0.2 oz (81.2 kg)  06/28/18 182 lb (82.6 kg)    General: Early male, appears comfortable at rest. HEENT: Conjunctiva and lids normal, wearing a mask. Neck: Supple, no elevated JVP or carotid bruits, no thyromegaly. Lungs: Clear to auscultation, nonlabored breathing at rest. Cardiac: Regular rate and rhythm, no S3, 2/6 systolic murmur. Abdomen: Soft, nontender, bowel sounds present. Extremities: No pitting edema, distal pulses 2+. Skin: Warm and dry. Musculoskeletal: No kyphosis. Neuropsychiatric: Alert and oriented x3, affect grossly appropriate.  ECG:  An ECG dated 06/28/2018 was personally reviewed today and demonstrated:  Sinus bradycardia with prolonged PR interval.  Possible septal infarct pattern.  Recent Labwork: 04/20/2018: Hemoglobin 12.13 November 2018: Hemoglobin 13.1, platelets 239, BUN 13, creatinine 0.96, potassium 4.8, AST 16, ALT 13, cholesterol 165, triglycerides 93, HDL 72, LDL 76, TSH 1.82  Other Studies Reviewed Today:  Echocardiogram 12/30/2016: Study Conclusions  - Left ventricle: The cavity size was normal. Wall thickness was normal. Systolic function was vigorous. The estimated ejection fraction was  in the range of 65% to 70%. Wall motion was normal; there were no regional wall motion abnormalities. Doppler parameters are consistent with abnormal left ventricular relaxation (grade 1 diastolic dysfunction). Doppler parameters are consistent with high ventricular filling pressure. - Aortic valve: Bioprosthetic aortic valve noted. It is functioning normally. There was no stenosis. There was trivial regurgitation. Peak velocity (S): 263 cm/s. Mean gradient (S): 14 mm Hg. - Mitral valve: Moderately calcified annulus. Mildly thickened, mildly calcified leaflets . There was trivial regurgitation. - Tricuspid valve: There was mild regurgitation. - Pulmonic valve: There was mild regurgitation. - Pulmonary arteries: PA peak pressure: 44 mm Hg (S).  Assessment and Plan:  1.  CAD status post CABG in 2013.  He remains free of angina on medical therapy, has not used any nitroglycerin.  Continue aspirin, beta-blocker, ARB, Norvasc, and statin.  2.  Mixed hyperlipidemia, continues on Lipitor with recent LDL 76.  3.  Aortic stenosis status post bioprosthetic AVR  at the time of CABG in 2013.  No change in heart murmur.  Last echocardiogram was in 2018 as outlined above with mean gradient 14 mmHg.  Medication Adjustments/Labs and Tests Ordered: Current medicines are reviewed at length with the patient today.  Concerns regarding medicines are outlined above.   Tests Ordered: No orders of the defined types were placed in this encounter.   Medication Changes: No orders of the defined types were placed in this encounter.   Disposition:  Follow up 6 months in the [place] office.  Signed, Satira Sark, MD, [facility] 12/20/2018 4:21 PM    [place] [facility] [place], [place], [place] [address] Phone: [phone]; Fax: [phone]

## 2018-12-20 NOTE — Patient Instructions (Addendum)

## 2019-01-03 DIAGNOSIS — E871 Hypo-osmolality and hyponatremia: Secondary | ICD-10-CM | POA: Diagnosis not present

## 2019-02-09 ENCOUNTER — Ambulatory Visit: Payer: Medicare HMO | Attending: Internal Medicine

## 2019-02-09 ENCOUNTER — Other Ambulatory Visit: Payer: Self-pay

## 2019-02-09 DIAGNOSIS — Z20822 Contact with and (suspected) exposure to covid-19: Secondary | ICD-10-CM

## 2019-02-10 LAB — NOVEL CORONAVIRUS, NAA: SARS-CoV-2, NAA: NOT DETECTED

## 2019-02-13 ENCOUNTER — Telehealth: Payer: Self-pay | Admitting: Internal Medicine

## 2019-02-13 NOTE — Telephone Encounter (Signed)
Patient is calling to receive his COVID test result. Patient expressed understanding.

## 2019-02-17 DIAGNOSIS — Z23 Encounter for immunization: Secondary | ICD-10-CM | POA: Diagnosis not present

## 2019-03-18 DIAGNOSIS — Z23 Encounter for immunization: Secondary | ICD-10-CM | POA: Diagnosis not present

## 2019-05-17 ENCOUNTER — Encounter: Payer: Self-pay | Admitting: *Deleted

## 2019-05-17 ENCOUNTER — Telehealth: Payer: Self-pay | Admitting: *Deleted

## 2019-05-17 NOTE — Telephone Encounter (Signed)
Wife called requesting an appointment to see Dr. Diona Browner for fluctuating blood pressure SBP as high as 200 and as low as 108. Reports BP is being monitored PCP but he doesn't feel its adequate enough. Offered appointment with Mardelle Matte tomorrow but patient declined. Appointment scheduled for Monday, 05/22/19 @2 :00 pm.

## 2019-05-21 NOTE — Progress Notes (Signed)
Cardiology Office Note  Date: 05/22/2019   ID: Edward Pena, Edward Pena 28-Feb-1926, MRN [id number]  PCP:  Edward Specking, MD  Cardiologist:  Nona Dell, MD Electrophysiologist:  None   Chief Complaint: Follow-up CAD, HLD, aortic stenosis  History of Present Illness: Edward Pena is a 86 y.o. male with a history of CAD status post CABG 2013, HDL, aortic stenosis status post bioprosthetic AVR, CABG in 2013.  Last saw Dr. Diona Browner 12/20/2018.  At that time he did not report any angina or nitroglycerin use.  He was functional with his ADLs.  He was continuing his cardiac regimen including aspirin, Lipitor, Lopressor, Lotrel, and sublingual nitroglycerin.  Patient had a recent visit to the emergency room for complaints of significant back pain.  His blood pressure was noted to be significantly elevated at 180/58.  Subsequent visit with his primary care provider patient was continuing to have back pain.  His PCP told him to take an extra one half dose of the Coreg.  Patient states he feels terrible on the extra dose.  His blood pressure is well controlled today.  He denies any other anginal or exertional symptoms, palpitations or arrhythmias, orthostatic symptoms, stroke or TIA-like symptoms, blood in stool or urine.  Claudication-like symptoms.  No lower extremity edema noted.  States his blood pressure prior to the episode of back pain and was normally in the 130s to 140 systolic.  States he and his wife both received both Covid vaccines recently without any side effects.  Past Medical History:  Diagnosis Date  . Aortic regurgitation   . Aortic stenosis   . Blood transfusion   . Carotid artery disease (HCC)    Mild  . Coronary atherosclerosis of native coronary artery    DES LAD 10/07  . Dysphagia 02/23/2017  . Essential hypertension   . GERD (gastroesophageal reflux disease)   . Mixed hyperlipidemia   . Peptic ulcer disease     Past Surgical History:  Procedure Laterality Date  .  AORTIC VALVE REPLACEMENT  03/12/2011   Procedure: AORTIC VALVE REPLACEMENT (AVR);  Surgeon: Kathlee Nations Suann Larry, MD;  Location: [facility] OR;  Service: Open Heart Surgery;  Laterality: N/A;  Aortic Valve Replacement   . APPENDECTOMY    . CARPAL TUNNEL RELEASE Left 11/10/2018   Procedure: LEFT CARPAL TUNNEL RELEASE;  Surgeon: Cindee Salt, MD;  Location: [facility];  Service: Orthopedics;  Laterality: Left;  AXILLARY BLOCK  . CORONARY ARTERY BYPASS GRAFT  03/12/2011   Procedure: CORONARY ARTERY BYPASS GRAFTING (CABG);  Surgeon: Kathlee Nations Suann Larry, MD;  Location: [facility] OR;  Service: Open Heart Surgery;  Laterality: N/A;  Coronary Artery Bypass Graft on pump times  two utilizing left internal mammary artery and right greater saphenous vein, harvested endoscopically.  Transesophageal echocardiogram   . ESOPHAGOGASTRODUODENOSCOPY N/A 12/12/2015   Procedure: ESOPHAGOGASTRODUODENOSCOPY (EGD);  Surgeon: Malissa Hippo, MD;  [facility];  Service: Endoscopy;  Laterality: N/A;  255  . ESOPHAGOGASTRODUODENOSCOPY N/A 03/26/2017   Procedure: ESOPHAGOGASTRODUODENOSCOPY (EGD);  Surgeon: Malissa Hippo, MD;  [facility];  Service: Endoscopy;  Laterality: N/A;  10:40  . FASCIECTOMY Left 11/10/2018   Procedure: SEGMENTAL FASCIECTOMY LEFT RING FINGER AND LEFT MIDDLE FINGER;  Surgeon: Cindee Salt, MD;  Location: [facility];  Service: Orthopedics;  Laterality: Left;  . YAG LASER APPLICATION Left 01/15/2014   Procedure: YAG LASER APPLICATION;  Surgeon: Susa Simmonds, MD;  [facility]: [facility];  Service:  Ophthalmology;  Laterality: Left;  . YAG LASER APPLICATION Right 02/12/2014   Procedure: YAG LASER APPLICATION;  Surgeon: Susa Simmonds, MD;  Location: [facility];  Service: Ophthalmology;  Laterality: Right;    Current Outpatient Medications  Medication Sig Dispense Refill  . amLODipine-benazepril (LOTREL) 5-40 MG capsule Take 1 capsule by mouth daily.    [name] aspirin EC 81 MG  tablet Take 1 tablet (81 mg total) by mouth 3 (three) times a week.    [name] atorvastatin (LIPITOR) 20 MG tablet Take 1 tablet (20 mg total) by mouth daily. (Patient taking differently: Take 20 mg by mouth. Takes 1-2 x per week) 30 tablet 6  . carvedilol (COREG) 12.5 MG tablet Take 1 tablet (12.5 mg total) by mouth 2 (two) times daily with a meal.    . lansoprazole (PREVACID) 15 MG capsule Take 15 mg by mouth as needed.    . metoprolol tartrate (LOPRESSOR) 25 MG tablet TAKE 1 TABLET THREE TIMES A DAY (Patient taking differently: Take 25 mg by mouth 3 times daily) 270 tablet 2  . Multiple Vitamin (MULTIVITAMIN) tablet Take 1 tablet by mouth daily.      . nitroGLYCERIN (NITROSTAT) 0.4 MG SL tablet Place 1 tablet (0.4 mg total) under the tongue every 5 (five) minutes x 3 doses as needed for chest pain (if no relief after 3rd dose proceed to the ED for an evaluation). 25 tablet 3  . Polyethylene Glycol 400 (BLINK TEARS OP) Place 2 drops into both eyes daily as needed (for dry eyes).     No current facility-administered medications for this visit.   Allergies:  Patient has no known allergies.   Social History: The patient  reports that he has never smoked. He has never used smokeless tobacco. He reports current alcohol use. He reports that he does not use drugs.   Family History: The patient's family history includes Hypertension in an other family member.   ROS:  Please see the history of present illness. Otherwise, complete review of systems is positive for none.  All other systems are reviewed and negative.   Physical Exam: VS:  BP (!) 115/50   Pulse (!) 52   Ht 5\' 10"  (1.778 m)   Wt 171 lb 3.2 oz (77.7 kg)   SpO2 97%   BMI 24.56 kg/m , BMI Body mass index is 24.56 kg/m.  Wt Readings from Last 3 Encounters:  05/22/19 171 lb 3.2 oz (77.7 kg)  12/20/18 175 lb 9.6 oz (79.7 kg)  11/10/18 179 lb 0.2 oz (81.2 kg)    General: Patient appears comfortable at rest. Neck: Supple, no elevated JVP or  carotid bruits, no thyromegaly. Lungs: Clear to auscultation, nonlabored breathing at rest. Cardiac: Regular rate and rhythm, no S3 or significant systolic murmur, no pericardial rub. Extremities: No pitting edema, distal pulses 2+. Skin: Warm and dry.  Musculoskeletal: No kyphosis. Neuropsychiatric: Alert and oriented x3, affect grossly appropriate.  ECG:  An ECG dated 05/22/2019 was personally reviewed today and demonstrated:  Sinus bradycardia rate of 84.  First-degree AV block   Recent Labwork: No results found for requested labs within last 8760 hours.  No results found for: CHOL, TRIG, HDL, CHOLHDL, VLDL, LDLCALC, LDLDIRECT  Other Studies Reviewed Today:  Echocardiogram 12/30/2016: Study Conclusions  - Left ventricle: The cavity size was normal. Wall thickness was normal. Systolic function was vigorous. The estimated ejection fraction was in the range of 65% to 70%. Wall motion was normal; there were no regional wall motion  abnormalities. Doppler parameters are consistent with abnormal left ventricular relaxation (grade 1 diastolic dysfunction). Doppler parameters are consistent with high ventricular filling pressure. - Aortic valve: Bioprosthetic aortic valve noted. It is functioning normally. There was no stenosis. There was trivial regurgitation. Peak velocity (S): 263 cm/s. Mean gradient (S): 14 mm Hg. - Mitral valve: Moderately calcified annulus. Mildly thickened, mildly calcified leaflets . There was trivial regurgitation. - Tricuspid valve: There was mild regurgitation. - Pulmonic valve: There was mild regurgitation. - Pulmonary arteries: PA peak pressure: 44 mm Hg (S).   Assessment and Plan:  1. CAD in native artery   2. Mixed hyperlipidemia   3. Aortic valve disease   4. Essential hypertension    1. CAD in native artery He denies any recent progressive anginal or exertional symptoms.  His PCP recently increased his carvedilol to 18.75 mg  daily due to increased blood pressures.  However his blood pressure was likely elevated due to recent episodes of severe back pain.  States his back pain has resolved.  His blood pressure is within normal limits today.  He is bradycardic with a rate of 54 and states he feels terrible on the increased dose and is having activity intolerance and fatigue related to the increased dose per his statement.  2. Mixed hyperlipidemia Lipid profile last year in November showed total cholesterol 165, triglycerides 93, HDL 72, LDL 76.  Continue atorvastatin 20 mg p.o. daily.  3. Aortic valve disease  Aortic valve: Bioprosthetic aortic valve noted. It is functioning normally. There was no stenosis.  He does have 2 out of 6 diastolic murmur heard best at right upper sternal border.  He had trivial AV regurg on echo and 2018.  He is asymptomatic.  4.  Hypertension. Patient had a recent visit to the emergency room for severe back pain.  His blood pressure was noted to be elevated at 180/58.  He continued with back pain on follow-up with his PCP who increased his carvedilol to 18.75 from 12.5 mg p.o. twice daily.  Patient states the increased dose is making him feel terrible.  His blood pressure is 115/50 today with a heart rate of 52.  Decrease carvedilol back to 12.5 mg p.o. twice daily.  Blood pressure was likely elevated to significant back pain.  Medication Adjustments/Labs and Tests Ordered: Current medicines are reviewed at length with the patient today.  Concerns regarding medicines are outlined above.   Disposition: Follow-up with scheduled visit with Dr. Domenic Polite Signed, Levell July, NP 05/22/2019 2:48 PM    [facility] at [place], [place], [place] [address] Phone: [phone]; Fax: [phone]

## 2019-05-22 ENCOUNTER — Other Ambulatory Visit: Payer: Self-pay

## 2019-05-22 ENCOUNTER — Ambulatory Visit (INDEPENDENT_AMBULATORY_CARE_PROVIDER_SITE_OTHER): Payer: Medicare HMO | Admitting: Family Medicine

## 2019-05-22 ENCOUNTER — Encounter: Payer: Self-pay | Admitting: Family Medicine

## 2019-05-22 VITALS — BP 115/50 | HR 52 | Ht 70.0 in | Wt 171.2 lb

## 2019-05-22 DIAGNOSIS — I10 Essential (primary) hypertension: Secondary | ICD-10-CM | POA: Diagnosis not present

## 2019-05-22 DIAGNOSIS — I2510 Atherosclerotic heart disease of native coronary artery without angina pectoris: Secondary | ICD-10-CM

## 2019-05-22 DIAGNOSIS — I359 Nonrheumatic aortic valve disorder, unspecified: Secondary | ICD-10-CM

## 2019-05-22 DIAGNOSIS — E782 Mixed hyperlipidemia: Secondary | ICD-10-CM | POA: Diagnosis not present

## 2019-05-22 MED ORDER — CARVEDILOL 12.5 MG PO TABS: 12.5000 mg | ORAL_TABLET | Freq: Two times a day (BID) | ORAL | Status: DC

## 2019-05-22 NOTE — Addendum Note (Signed)
Addended by: Lesle Chris on: 05/22/2019 04:38 PM   Modules accepted: Orders

## 2019-05-22 NOTE — Patient Instructions (Addendum)
Medication Instructions:   Decrease Coreg back to 12.5mg  twice a day.  Continue all other medications.    Labwork: none  Testing/Procedures: none  Follow-Up: As planned with Dr. Diona Browner   Any Other Special Instructions Will Be Listed Below (If Applicable).  If you need a refill on your cardiac medications before your next appointment, please call your pharmacy.

## 2019-06-23 ENCOUNTER — Ambulatory Visit (INDEPENDENT_AMBULATORY_CARE_PROVIDER_SITE_OTHER): Payer: Medicare HMO | Admitting: Cardiology

## 2019-06-23 ENCOUNTER — Other Ambulatory Visit: Payer: Self-pay

## 2019-06-23 ENCOUNTER — Encounter: Payer: Self-pay | Admitting: Cardiology

## 2019-06-23 VITALS — BP 160/60 | HR 78 | Ht 70.0 in | Wt 172.0 lb

## 2019-06-23 DIAGNOSIS — Z953 Presence of xenogenic heart valve: Secondary | ICD-10-CM

## 2019-06-23 DIAGNOSIS — I25119 Atherosclerotic heart disease of native coronary artery with unspecified angina pectoris: Secondary | ICD-10-CM | POA: Diagnosis not present

## 2019-06-23 DIAGNOSIS — I10 Essential (primary) hypertension: Secondary | ICD-10-CM | POA: Diagnosis not present

## 2019-06-23 NOTE — Patient Instructions (Addendum)
Medication Instructions:   Your physician recommends that you continue on your current medications as directed. Please refer to the Current Medication list given to you today.  Labwork:  NONE  Testing/Procedures:  NONE  Follow-Up:  Your physician recommends that you schedule a follow-up appointment in: 6 months (office).  Any Other Special Instructions Will Be Listed Below (If Applicable). Your physician has requested that you regularly monitor and record your blood pressure readings at home once a day 1-2 hours after your medications. Please use the same machine at the same time of day to check your readings and record them.  If you need a refill on your cardiac medications before your next appointment, please call your pharmacy.

## 2019-06-23 NOTE — Progress Notes (Signed)
Cardiology Office Note  Date: 06/23/2019   ID: ELIZABETH HAFF, DOB 1926-12-07, MRN [id number]  PCP:  Ignatius Specking, MD  Cardiologist:  Nona Dell, MD Electrophysiologist:  None   Chief Complaint  Patient presents with  . Cardiac follow-up    History of Present Illness: Edward Pena is a 86 y.o. male seen recently in mid May by Mr. Vincenza Hews NP.  He presents for a routine visit today. At the last encounter Coreg was decreased from 18.75 twice daily down to 12.5 twice daily due to intolerance and suspected symptomatic bradycardia.  A few weeks ago, Dr. Sherril Croon took him off Coreg completely due to continued symptoms, and he states today that he feels much better.  His heart rate at rest is now in the 70s.  He has home blood pressure monitoring in place with readings to Dr. Sherril Croon.  I suggested to him that if his systolics remain above 150, he likely needs to consider starting on an additional agent, possibly chlorthalidone with potassium supplement.  He otherwise is tolerating Lotrel.  He does not describe any active angina symptoms at this time, no nitroglycerin use.  He remains on aspirin and statin.  Past Medical History:  Diagnosis Date  . Aortic regurgitation   . Aortic stenosis   . Blood transfusion   . Carotid artery disease (HCC)    Mild  . Coronary atherosclerosis of native coronary artery    DES LAD 10/07  . Dysphagia 02/23/2017  . Essential hypertension   . GERD (gastroesophageal reflux disease)   . Mixed hyperlipidemia   . Peptic ulcer disease     Past Surgical History:  Procedure Laterality Date  . AORTIC VALVE REPLACEMENT  03/12/2011   Procedure: AORTIC VALVE REPLACEMENT (AVR);  Surgeon: Kathlee Nations Suann Larry, MD;  Location[facility] OR;  Service: Open Heart Surgery;  Laterality: N/A;  Aortic Valve Replacement   . APPENDECTOMY    . CARPAL TUNNEL RELEASE Left 11/10/2018   Procedure: LEFT CARPAL TUNNEL RELEASE;  Surgeon: Cindee Salt, MD;  Location: [facility];   Service: Orthopedics;  Laterality: Left;  AXILLARY BLOCK  . CORONARY ARTERY BYPASS GRAFT  03/12/2011   Procedure: CORONARY ARTERY BYPASS GRAFTING (CABG);  Surgeon: Kathlee Nations Suann Larry, MD;  Location: [facility] OR;  Service: Open Heart Surgery;  Laterality: N/A;  Coronary Artery Bypass Graft on pump times  two utilizing left internal mammary artery and right greater saphenous vein, harvested endoscopically.  Transesophageal echocardiogram   . ESOPHAGOGASTRODUODENOSCOPY N/A 12/12/2015   Procedure: ESOPHAGOGASTRODUODENOSCOPY (EGD);  Surgeon: Malissa Hippo, MD;  [facility];  Service: Endoscopy;  Laterality: N/A;  255  . ESOPHAGOGASTRODUODENOSCOPY N/A 03/26/2017   Procedure: ESOPHAGOGASTRODUODENOSCOPY (EGD);  Surgeon: Malissa Hippo, MD;  [facility];  Service: Endoscopy;  Laterality: N/A;  10:40  . FASCIECTOMY Left 11/10/2018   Procedure: SEGMENTAL FASCIECTOMY LEFT RING FINGER AND LEFT MIDDLE FINGER;  Surgeon: Cindee Salt, MD;  Location: [facility];  Service: Orthopedics;  Laterality: Left;  . YAG LASER APPLICATION Left 01/15/2014   Procedure: YAG LASER APPLICATION;  Surgeon: Susa Simmonds, MD;  [facility];  Service: Ophthalmology;  Laterality: Left;  . YAG LASER APPLICATION Right 02/12/2014   Procedure: YAG LASER APPLICATION;  Surgeon: Susa Simmonds, MD;  Location: [facility];  Service: Ophthalmology;  Laterality: Right;    Current Outpatient Medications  Medication Sig Dispense Refill  . amLODipine-benazepril (LOTREL) 5-40 MG capsule Take 1 capsule by  mouth daily.    [name] aspirin EC 81 MG tablet Take 1 tablet (81 mg total) by mouth 3 (three) times a week.    [name] atorvastatin (LIPITOR) 20 MG tablet Take 1 tablet (20 mg total) by mouth daily. (Patient taking differently: Take 20 mg by mouth. Takes 1-2 x per week) 30 tablet 6  . lansoprazole (PREVACID) 15 MG capsule Take 15 mg by mouth as needed.    . Multiple Vitamin (MULTIVITAMIN) tablet Take 1 tablet by mouth  daily.      . nitroGLYCERIN (NITROSTAT) 0.4 MG SL tablet Place 1 tablet (0.4 mg total) under the tongue every 5 (five) minutes x 3 doses as needed for chest pain (if no relief after 3rd dose proceed to the ED for an evaluation). 25 tablet 3  . Polyethylene Glycol 400 (BLINK TEARS OP) Place 2 drops into both eyes daily as needed (for dry eyes).     No current facility-administered medications for this visit.   Allergies:  Patient has no known allergies.   ROS:  Hearing loss.  Physical Exam: VS:  BP (!) 160/60   Pulse 78   Ht 5\' 10"  (1.778 m)   Wt 172 lb (78 kg)   SpO2 98%   BMI 24.68 kg/m , BMI Body mass index is 24.68 kg/m.  Wt Readings from Last 3 Encounters:  06/23/19 172 lb (78 kg)  05/22/19 171 lb 3.2 oz (77.7 kg)  12/20/18 175 lb 9.6 oz (79.7 kg)    General:  Elderly male, appears comfortable at rest. HEENT: Conjunctiva and lids normal, wearing a mask. Neck: Supple, no elevated JVP or carotid bruits, no thyromegaly. Lungs: Clear to auscultation, nonlabored breathing at rest. Cardiac: Regular rate and rhythm, no S3, 2/6 systolic murmur, no pericardial rub. Extremities: No pitting edema, distal pulses 2+.  ECG:  An ECG dated 05/22/2019 was personally reviewed today and demonstrated:  Probable sinus bradycardia with significantly prolonged PR interval and lead artifact.  Recent Labwork:  November 2020: Hemoglobin 13.1, platelets 239, BUN 13, creatinine 0.96 potassium 4.8, AST 16, ALT 13, cholesterol 165, triglycerides 93, HDL 72, LDL 76, TSH 1.82  Other Studies Reviewed Today:  Echocardiogram 12/30/2016: Study Conclusions   - Left ventricle: The cavity size was normal. Wall thickness was  normal. Systolic function was vigorous. The estimated ejection  fraction was in the range of 65% to 70%. Wall motion was normal;  there were no regional wall motion abnormalities. Doppler  parameters are consistent with abnormal left ventricular  relaxation (grade 1  diastolic dysfunction). Doppler parameters  are consistent with high ventricular filling pressure.  - Aortic valve: Bioprosthetic aortic valve noted. It is functioning  normally. There was no stenosis. There was trivial regurgitation.  Peak velocity (S): 263 cm/s. Mean gradient (S): 14 mm Hg.  - Mitral valve: Moderately calcified annulus. Mildly thickened,  mildly calcified leaflets . There was trivial regurgitation.  - Tricuspid valve: There was mild regurgitation.  - Pulmonic valve: There was mild regurgitation.  - Pulmonary arteries: PA peak pressure: 44 mm Hg (S).   Assessment and Plan:  1.  Essential hypertension, now on Lotrel.  He was taken off Coreg with symptomatic bradycardia and lightheadedness, symptoms have resolved.  He has a home blood pressure monitor with results being sent to Dr. 01/01/2017.  I suggested to him that if his systolics remain over 150 consistently, may want to consider starting on chlorthalidone with low-dose potassium supplement.  2.  CAD status post CABG in 2013.  He does not report any active angina at this time.  Continue aspirin and Lipitor.  He has as needed nitroglycerin available.  3.  Aortic stenosis status post bioprosthetic AVR in 2013.  No change in cardiac murmur.  Last echocardiogram was in 2018.  Medication Adjustments/Labs and Tests Ordered: Current medicines are reviewed at length with the patient today.  Concerns regarding medicines are outlined above.   Tests Ordered: No orders of the defined types were placed in this encounter.   Medication Changes: No orders of the defined types were placed in this encounter.   Disposition:  Follow up 6 months in the [place] office.  Signed, Satira Sark, MD, [facility] 06/23/2019 2:12 PM    [facility] at [place], [place], [place] [address] Phone: [phone]; Fax: [phone]

## 2019-12-06 DIAGNOSIS — Z23 Encounter for immunization: Secondary | ICD-10-CM | POA: Diagnosis not present

## 2019-12-29 ENCOUNTER — Ambulatory Visit (INDEPENDENT_AMBULATORY_CARE_PROVIDER_SITE_OTHER): Payer: Medicare HMO | Admitting: Cardiology

## 2019-12-29 VITALS — BP 152/52 | HR 71 | Resp 16 | Ht 70.0 in | Wt 172.8 lb

## 2019-12-29 DIAGNOSIS — Z953 Presence of xenogenic heart valve: Secondary | ICD-10-CM

## 2019-12-29 DIAGNOSIS — I25119 Atherosclerotic heart disease of native coronary artery with unspecified angina pectoris: Secondary | ICD-10-CM | POA: Diagnosis not present

## 2019-12-29 NOTE — Progress Notes (Signed)
Cardiology Office Note  Date: 12/29/2019   ID: ASHYR Pena, DOB [birth year], MRN [id number]  PCP:  Edward Specking, MD  Cardiologist:  Nona Dell, MD Electrophysiologist:  None   Chief Complaint  Patient presents with  . Follow-up    6 mth     History of Present Illness: Edward Pena is a 86 y.o. male last seen in June.  He presents for a routine visit.  Reports no angina symptoms or nitroglycerin use.  He goes to the [facility] 2-3 times a week.  No palpitations, dizziness, or syncope.  I reviewed his medications which are outlined below.  As noted previously he was taken off beta-blocker due to symptomatic bradycardia.  He continues to follow with Dr. Sherril Croon for primary care.  Past Medical History:  Diagnosis Date  . Aortic regurgitation   . Aortic stenosis   . Blood transfusion   . Carotid artery disease (HCC)    Mild  . Coronary atherosclerosis of native coronary artery    DES LAD 10/07  . Dysphagia 02/23/2017  . Essential hypertension   . GERD (gastroesophageal reflux disease)   . Mixed hyperlipidemia   . Peptic ulcer disease     Past Surgical History:  Procedure Laterality Date  . AORTIC VALVE REPLACEMENT  03/12/2011   Procedure: AORTIC VALVE REPLACEMENT (AVR);  Surgeon: Kathlee Nations Suann Larry, MD;  Location: [facility] OR;  Service: Open Heart Surgery;  Laterality: N/A;  Aortic Valve Replacement   . APPENDECTOMY    . CARPAL TUNNEL RELEASE Left 11/10/2018   Procedure: LEFT CARPAL TUNNEL RELEASE;  Surgeon: Cindee Salt, MD;  Location: [facility];  Service: Orthopedics;  Laterality: Left;  AXILLARY BLOCK  . CORONARY ARTERY BYPASS GRAFT  03/12/2011   Procedure: CORONARY ARTERY BYPASS GRAFTING (CABG);  Surgeon: Kathlee Nations Suann Larry, MD;  Location: [facility] OR;  Service: Open Heart Surgery;  Laterality: N/A;  Coronary Artery Bypass Graft on pump times  two utilizing left internal mammary artery and right greater saphenous vein, harvested endoscopically.  Transesophageal  echocardiogram   . ESOPHAGOGASTRODUODENOSCOPY N/A 12/12/2015   Procedure: ESOPHAGOGASTRODUODENOSCOPY (EGD);  Surgeon: Malissa Hippo, MD;  [facility];  Service: Endoscopy;  Laterality: N/A;  255  . ESOPHAGOGASTRODUODENOSCOPY N/A 03/26/2017   Procedure: ESOPHAGOGASTRODUODENOSCOPY (EGD);  Surgeon: Malissa Hippo, MD;  [facility];  Service: Endoscopy;  Laterality: N/A;  10:40  . FASCIECTOMY Left 11/10/2018   Procedure: SEGMENTAL FASCIECTOMY LEFT RING FINGER AND LEFT MIDDLE FINGER;  Surgeon: Cindee Salt, MD;  Location: [facility];  Service: Orthopedics;  Laterality: Left;  . YAG LASER APPLICATION Left 01/15/2014   Procedure: YAG LASER APPLICATION;  Surgeon: Susa Simmonds, MD;  [facility];  Service: Ophthalmology;  Laterality: Left;  . YAG LASER APPLICATION Right 02/12/2014   Procedure: YAG LASER APPLICATION;  Surgeon: Susa Simmonds, MD;  [facility];  Service: Ophthalmology;  Laterality: Right;    Current Outpatient Medications  Medication Sig Dispense Refill  . amLODipine-benazepril (LOTREL) 5-40 MG capsule Take 1 capsule by mouth daily.    [name] aspirin EC 81 MG tablet Take 1 tablet (81 mg total) by mouth 3 (three) times a week.    [name] atorvastatin (LIPITOR) 20 MG tablet Take 1 tablet (20 mg total) by mouth daily. (Patient taking differently: Take 20 mg by mouth. Takes 1-2 x per week) 30 tablet 6  . lansoprazole (PREVACID) 15 MG capsule Take 15 mg by mouth as needed.    [name]  Multiple Vitamin (MULTIVITAMIN) tablet Take 1 tablet by mouth daily.      . nitroGLYCERIN (NITROSTAT) 0.4 MG SL tablet Place 1 tablet (0.4 mg total) under the tongue every 5 (five) minutes x 3 doses as needed for chest pain (if no relief after 3rd dose proceed to the ED for an evaluation). 25 tablet 3  . Polyethylene Glycol 400 (BLINK TEARS OP) Place 2 drops into both eyes daily as needed (for dry eyes).     No current facility-administered medications for this visit.    Allergies:  Patient has no known allergies.   ROS: No syncope.  Physical Exam: VS:  BP (!) 152/52   Pulse 71   Resp 16   Ht 5\' 10"  (1.778 m)   Wt 172 lb 12.8 oz (78.4 kg)   SpO2 99%   BMI 24.79 kg/m , BMI Body mass index is 24.79 kg/m.  Wt Readings from Last 3 Encounters:  12/29/19 172 lb 12.8 oz (78.4 kg)  06/23/19 172 lb (78 kg)  05/22/19 171 lb 3.2 oz (77.7 kg)    General: Elderly male, appears comfortable at rest. HEENT: Conjunctiva and lids normal, wearing a mask. Neck: Supple, no elevated JVP or carotid bruits, no thyromegaly. Lungs: Clear to auscultation, nonlabored breathing at rest. Cardiac: Regular rate and rhythm, no S3, 2/6 systolic murmur, no pericardial rub. Extremities: No pitting edema.  ECG:  An ECG dated 05/22/2019 was personally reviewed today and demonstrated:  Sinus bradycardia with significantly prolonged PR interval and lead artifact.  Recent Labwork:  November 2020: Hemoglobin 13.1, platelets 239, BUN 13, creatinine 0.96 potassium 4.8, AST 16, ALT 13, cholesterol 165, triglycerides 93, HDL 72, LDL 76, TSH 1.82  Other Studies Reviewed Today:  Echocardiogram 12/30/2016: Study Conclusions   - Left ventricle: The cavity size was normal. Wall thickness was  normal. Systolic function was vigorous. The estimated ejection  fraction was in the range of 65% to 70%. Wall motion was normal;  there were no regional wall motion abnormalities. Doppler  parameters are consistent with abnormal left ventricular  relaxation (grade 1 diastolic dysfunction). Doppler parameters  are consistent with high ventricular filling pressure.  - Aortic valve: Bioprosthetic aortic valve noted. It is functioning  normally. There was no stenosis. There was trivial regurgitation.  Peak velocity (S): 263 cm/s. Mean gradient (S): 14 mm Hg.  - Mitral valve: Moderately calcified annulus. Mildly thickened,  mildly calcified leaflets . There was trivial  regurgitation.  - Tricuspid valve: There was mild regurgitation.  - Pulmonic valve: There was mild regurgitation.  - Pulmonary arteries: PA peak pressure: 44 mm Hg (S).   Assessment and Plan:  1.  CAD status post CABG in 2013.  We continue observation in the absence of angina symptoms.  Current regimen includes aspirin and Lipitor.  He has as needed nitroglycerin available.  2.  Aortic stenosis status post bioprosthetic AVR in 2013.  Follow-up echocardiogram in 2018 revealed stable prosthetic function with mean gradient 14 mmHg.  No significant change on examination.  Medication Adjustments/Labs and Tests Ordered: Current medicines are reviewed at length with the patient today.  Concerns regarding medicines are outlined above.   Tests Ordered: No orders of the defined types were placed in this encounter.   Medication Changes: No orders of the defined types were placed in this encounter.   Disposition:  Follow up 6 months in the [place] office.  Signed, [place], MD, [facility] 12/29/2019 2:14 PM     [facility]  at [place], [place], [place] [address] Phone: [phone]; Fax: [phone]

## 2019-12-29 NOTE — Patient Instructions (Addendum)

## 2020-06-12 NOTE — Progress Notes (Deleted)
Cardiology Office Note  Date: 06/12/2020   ID: Edward Pena, DOB 28-Dec-1926, MRN [id number]  PCP:  Ignatius Specking, MD  Cardiologist:  Nona Dell, MD Electrophysiologist:  None   No chief complaint on file.   History of Present Illness: Edward Pena is a 87 y.o. male last seen in December 2021.  Past Medical History:  Diagnosis Date  . Aortic regurgitation   . Aortic stenosis   . Blood transfusion   . Carotid artery disease (HCC)    Mild  . Coronary atherosclerosis of native coronary artery    DES LAD 10/07  . Dysphagia 02/23/2017  . Essential hypertension   . GERD (gastroesophageal reflux disease)   . Mixed hyperlipidemia   . Peptic ulcer disease     Past Surgical History:  Procedure Laterality Date  . AORTIC VALVE REPLACEMENT  03/12/2011   Procedure: AORTIC VALVE REPLACEMENT (AVR);  Surgeon: Kathlee Nations Suann Larry, MD;  Location: [facility] OR;  Service: Open Heart Surgery;  Laterality: N/A;  Aortic Valve Replacement   . APPENDECTOMY    . CARPAL TUNNEL RELEASE Left 11/10/2018   Procedure: LEFT CARPAL TUNNEL RELEASE;  Surgeon: Cindee Salt, MD;  Location: [facility];  Service: Orthopedics;  Laterality: Left;  AXILLARY BLOCK  . CORONARY ARTERY BYPASS GRAFT  03/12/2011   Procedure: CORONARY ARTERY BYPASS GRAFTING (CABG);  Surgeon: Kathlee Nations Suann Larry, MD;  Location: [facility] OR;  Service: Open Heart Surgery;  Laterality: N/A;  Coronary Artery Bypass Graft on pump times  two utilizing left internal mammary artery and right greater saphenous vein, harvested endoscopically.  Transesophageal echocardiogram   . ESOPHAGOGASTRODUODENOSCOPY N/A 12/12/2015   Procedure: ESOPHAGOGASTRODUODENOSCOPY (EGD);  Surgeon: Malissa Hippo, MD;  [facility];  Service: Endoscopy;  Laterality: N/A;  255  . ESOPHAGOGASTRODUODENOSCOPY N/A 03/26/2017   Procedure: ESOPHAGOGASTRODUODENOSCOPY (EGD);  Surgeon: Malissa Hippo, MD;  [facility];  Service: Endoscopy;  Laterality:  N/A;  10:40  . FASCIECTOMY Left 11/10/2018   Procedure: SEGMENTAL FASCIECTOMY LEFT RING FINGER AND LEFT MIDDLE FINGER;  Surgeon: Cindee Salt, MD;  Location: [facility];  Service: Orthopedics;  Laterality: Left;  . YAG LASER APPLICATION Left 01/15/2014   Procedure: YAG LASER APPLICATION;  Surgeon: Susa Simmonds, MD;  [facility];  Service: Ophthalmology;  Laterality: Left;  . YAG LASER APPLICATION Right 02/12/2014   Procedure: YAG LASER APPLICATION;  Surgeon: Susa Simmonds, MD;  [facility];  Service: Ophthalmology;  Laterality: Right;    Current Outpatient Medications  Medication Sig Dispense Refill  . amLODipine-benazepril (LOTREL) 5-40 MG capsule Take 1 capsule by mouth daily.    [name] aspirin EC 81 MG tablet Take 1 tablet (81 mg total) by mouth 3 (three) times a week.    [name] atorvastatin (LIPITOR) 20 MG tablet Take 1 tablet (20 mg total) by mouth daily. (Patient taking differently: Take 20 mg by mouth. Takes 1-2 x per week) 30 tablet 6  . lansoprazole (PREVACID) 15 MG capsule Take 15 mg by mouth as needed.    . Multiple Vitamin (MULTIVITAMIN) tablet Take 1 tablet by mouth daily.      . nitroGLYCERIN (NITROSTAT) 0.4 MG SL tablet Place 1 tablet (0.4 mg total) under the tongue every 5 (five) minutes x 3 doses as needed for chest pain (if no relief after 3rd dose proceed to the ED for an evaluation). 25 tablet 3  . Polyethylene Glycol 400 (BLINK TEARS OP) Place 2 drops  into both eyes daily as needed (for dry eyes).     No current facility-administered medications for this visit.   Allergies:  Patient has no known allergies.   Social History: The patient  reports that he has never smoked. He has never used smokeless tobacco. He reports current alcohol use. He reports that he does not use drugs.   Family History: The patient's family history includes Hypertension in an other family member.   ROS:  Please see the history of present illness. Otherwise, complete review of  systems is positive for {NONE DEFAULTED:18576::"none"}.  All other systems are reviewed and negative.   Physical Exam: VS:  There were no vitals taken for this visit., BMI There is no height or weight on file to calculate BMI.  Wt Readings from Last 3 Encounters:  12/29/19 172 lb 12.8 oz (78.4 kg)  06/23/19 172 lb (78 kg)  05/22/19 171 lb 3.2 oz (77.7 kg)    General: Patient appears comfortable at rest. HEENT: Conjunctiva and lids normal, oropharynx clear with moist mucosa. Neck: Supple, no elevated JVP or carotid bruits, no thyromegaly. Lungs: Clear to auscultation, nonlabored breathing at rest. Cardiac: Regular rate and rhythm, no S3 or significant systolic murmur, no pericardial rub. Abdomen: Soft, nontender, no hepatomegaly, bowel sounds present, no guarding or rebound. Extremities: No pitting edema, distal pulses 2+. Skin: Warm and dry. Musculoskeletal: No kyphosis. Neuropsychiatric: Alert and oriented x3, affect grossly appropriate.  ECG:  An ECG dated 05/22/2019 was personally reviewed today and demonstrated:  Sinus bradycardia with significantly prolonged PR interval and lead artifact.  Recent Labwork:  November 2020: Hemoglobin 13.1, platelets 239, BUN 13, creatinine 0.96 potassium 4.8, AST 16, ALT 13, cholesterol 165, triglycerides 93, HDL 72, LDL 76, TSH 1.82  Other Studies Reviewed Today:  Echocardiogram 12/30/2016: Study Conclusions   - Left ventricle: The cavity size was normal. Wall thickness was  normal. Systolic function was vigorous. The estimated ejection  fraction was in the range of 65% to 70%. Wall motion was normal;  there were no regional wall motion abnormalities. Doppler  parameters are consistent with abnormal left ventricular  relaxation (grade 1 diastolic dysfunction). Doppler parameters  are consistent with high ventricular filling pressure.  - Aortic valve: Bioprosthetic aortic valve noted. It is functioning  normally. There was no  stenosis. There was trivial regurgitation.  Peak velocity (S): 263 cm/s. Mean gradient (S): 14 mm Hg.  - Mitral valve: Moderately calcified annulus. Mildly thickened,  mildly calcified leaflets . There was trivial regurgitation.  - Tricuspid valve: There was mild regurgitation.  - Pulmonic valve: There was mild regurgitation.  - Pulmonary arteries: PA peak pressure: 44 mm Hg (S).   Assessment and Plan:    Medication Adjustments/Labs and Tests Ordered: Current medicines are reviewed at length with the patient today.  Concerns regarding medicines are outlined above.   Tests Ordered: No orders of the defined types were placed in this encounter.   Medication Changes: No orders of the defined types were placed in this encounter.   Disposition:  Follow up {follow up:15908}  Signed, Jonelle Sidle, MD, [facility] 06/12/2020 2:58 PM    [facility] at [facility] [address] [place], [place], [place] [address] Phone: [phone]; Fax: [phone]

## 2020-06-13 ENCOUNTER — Ambulatory Visit: Payer: Medicare HMO | Admitting: Cardiology

## 2020-06-13 DIAGNOSIS — I25119 Atherosclerotic heart disease of native coronary artery with unspecified angina pectoris: Secondary | ICD-10-CM

## 2020-08-08 ENCOUNTER — Other Ambulatory Visit: Payer: Self-pay

## 2020-08-08 ENCOUNTER — Ambulatory Visit (INDEPENDENT_AMBULATORY_CARE_PROVIDER_SITE_OTHER): Payer: Medicare HMO | Admitting: Cardiology

## 2020-08-08 ENCOUNTER — Encounter: Payer: Self-pay | Admitting: Cardiology

## 2020-08-08 VITALS — BP 140/60 | HR 69 | Ht 70.0 in | Wt 176.4 lb

## 2020-08-08 DIAGNOSIS — Z953 Presence of xenogenic heart valve: Secondary | ICD-10-CM

## 2020-08-08 DIAGNOSIS — I25119 Atherosclerotic heart disease of native coronary artery with unspecified angina pectoris: Secondary | ICD-10-CM | POA: Diagnosis not present

## 2020-08-08 DIAGNOSIS — I10 Essential (primary) hypertension: Secondary | ICD-10-CM

## 2020-08-08 NOTE — Addendum Note (Signed)
Addended by: Lesle Chris on: 08/08/2020 01:26 PM   Modules accepted: Orders

## 2020-08-08 NOTE — Progress Notes (Signed)
Cardiology Office Note  Date: 08/08/2020   ID: Isay, Perleberg Aug 16, 1926, MRN [id number]  PCP:  Ignatius Specking, MD  Cardiologist:  Nona Dell, MD Electrophysiologist:  None   Chief Complaint  Patient presents with   Cardiac follow-up    History of Present Illness: Edward Pena is a 87 y.o. male last seen in December 2021.  He is here for a follow-up visit.  Reports no major change in stamina, no angina symptoms or nitroglycerin use.  He has been having trouble with left hip pain, recently underwent injection for pain control.  I reviewed his medications which are stable from a cardiac perspective.  I personally reviewed his ECG which shows sinus rhythm with prolonged PR interval.  We discussed getting a follow-up echocardiogram in comparison to his last study from 2018.  He is status post bioprosthetic AVR in 2013.  Past Medical History:  Diagnosis Date   Aortic regurgitation    Aortic stenosis    Blood transfusion    Carotid artery disease (HCC)    Mild   Coronary atherosclerosis of native coronary artery    DES LAD 10/07   Dysphagia 02/23/2017   Essential hypertension    GERD (gastroesophageal reflux disease)    Mixed hyperlipidemia    Peptic ulcer disease     Past Surgical History:  Procedure Laterality Date   AORTIC VALVE REPLACEMENT  03/12/2011   Procedure: AORTIC VALVE REPLACEMENT (AVR);  Surgeon: Kathlee Nations Suann Larry, MD;  Location[facility];  Service: Open Heart Surgery;  Laterality: N/A;  Aortic Valve Replacement    APPENDECTOMY     CARPAL TUNNEL RELEASE Left 11/10/2018   Procedure: LEFT CARPAL TUNNEL RELEASE;  Surgeon: Cindee Salt, MD;  Location: [facility];  Service: Orthopedics;  Laterality: Left;  AXILLARY BLOCK   CORONARY ARTERY BYPASS GRAFT  03/12/2011   Procedure: CORONARY ARTERY BYPASS GRAFTING (CABG);  Surgeon: Kathlee Nations Suann Larry, MD;  Location: [facility] OR;  Service: Open Heart Surgery;  Laterality: N/A;  Coronary Artery Bypass Graft on  pump times  two utilizing left internal mammary artery and right greater saphenous vein, harvested endoscopically.  Transesophageal echocardiogram    ESOPHAGOGASTRODUODENOSCOPY N/A 12/12/2015   Procedure: ESOPHAGOGASTRODUODENOSCOPY (EGD);  Surgeon: Malissa Hippo, MD;  [facility];  Service: Endoscopy;  Laterality: N/A;  255   ESOPHAGOGASTRODUODENOSCOPY N/A 03/26/2017   Procedure: ESOPHAGOGASTRODUODENOSCOPY (EGD);  Surgeon: Malissa Hippo, MD;  [facility];  Service: Endoscopy;  Laterality: N/A;  10:40   FASCIECTOMY Left 11/10/2018   Procedure: SEGMENTAL FASCIECTOMY LEFT RING FINGER AND LEFT MIDDLE FINGER;  Surgeon: Cindee Salt, MD;  Location: [facility];  Service: Orthopedics;  Laterality: Left;   YAG LASER APPLICATION Left 01/15/2014   Procedure: YAG LASER APPLICATION;  Surgeon: Susa Simmonds, MD;  [facility];  Service: Ophthalmology;  Laterality: Left;   YAG LASER APPLICATION Right 02/12/2014   Procedure: YAG LASER APPLICATION;  Surgeon: Susa Simmonds, MD;  [facility];  Service: Ophthalmology;  Laterality: Right;    Current Outpatient Medications  Medication Sig Dispense Refill   amLODipine-benazepril (LOTREL) 5-40 MG capsule Take 1 capsule by mouth daily.     aspirin EC 81 MG tablet Take 1 tablet (81 mg total) by mouth 3 (three) times a week.     atorvastatin (LIPITOR) 20 MG tablet Take 20 mg by mouth. Takes 2-3 x per week     gabapentin (NEURONTIN) 100 MG capsule Take  100 mg by mouth at bedtime.     lansoprazole (PREVACID) 15 MG capsule Take 15 mg by mouth as needed.     Multiple Vitamin (MULTIVITAMIN) tablet Take 1 tablet by mouth daily.       nitroGLYCERIN (NITROSTAT) 0.4 MG SL tablet Place 1 tablet (0.4 mg total) under the tongue every 5 (five) minutes x 3 doses as needed for chest pain (if no relief after 3rd dose proceed to the ED for an evaluation). 25 tablet 3   Polyethylene Glycol 400 (BLINK TEARS OP) Place 2 drops into both  eyes daily as needed (for dry eyes).     No current facility-administered medications for this visit.   Allergies:  Patient has no known allergies.   ROS: Hearing loss.  Physical Exam: VS:  BP 140/60   Pulse 69   Ht 5\' 10"  (1.778 m)   Wt 176 lb 6.4 oz (80 kg)   SpO2 96%   BMI 25.31 kg/m , BMI Body mass index is 25.31 kg/m.  Wt Readings from Last 3 Encounters:  08/08/20 176 lb 6.4 oz (80 kg)  12/29/19 172 lb 12.8 oz (78.4 kg)  06/23/19 172 lb (78 kg)    General: Elderly male, appears comfortable at rest. HEENT: Conjunctiva and lids normal, wearing a mask. Neck: Supple, no elevated JVP or carotid bruits, no thyromegaly. Lungs: Clear to auscultation, nonlabored breathing at rest. Cardiac: Regular rate and rhythm, no S3, 2-3/6 systolic murmur, no pericardial rub. Extremities: No pitting edema.  ECG:  An ECG dated 05/22/2019 was personally reviewed today and demonstrated:  Sinus bradycardia with prolonged PR interval and lead artifact.  Recent Labwork:  April 2021: Potassium 4.8, BUN 19, creatinine 1.04, AST 16, ALT 15, magnesium 2.1  Other Studies Reviewed Today:  Echocardiogram 12/30/2016: Study Conclusions   - Left ventricle: The cavity size was normal. Wall thickness was    normal. Systolic function was vigorous. The estimated ejection    fraction was in the range of 65% to 70%. Wall motion was normal;    there were no regional wall motion abnormalities. Doppler    parameters are consistent with abnormal left ventricular    relaxation (grade 1 diastolic dysfunction). Doppler parameters    are consistent with high ventricular filling pressure.  - Aortic valve: Bioprosthetic aortic valve noted. It is functioning    normally. There was no stenosis. There was trivial regurgitation.    Peak velocity (S): 263 cm/s. Mean gradient (S): 14 mm Hg.  - Mitral valve: Moderately calcified annulus. Mildly thickened,    mildly calcified leaflets . There was trivial regurgitation.   - Tricuspid valve: There was mild regurgitation.  - Pulmonic valve: There was mild regurgitation.  - Pulmonary arteries: PA peak pressure: 44 mm Hg (S).  Assessment and Plan:  1.  CAD status post CABG in 2013.  ECG reviewed.  He does not report any angina or nitroglycerin use and we will continue with observation.  Currently on aspirin, Lipitor, and Lotrel.  2.  Aortic stenosis status post bioprosthetic AVR in 2013.  We will obtain a follow-up echocardiogram in comparison to previous study in 2018.  Mean gradient was 14 mmHg at last check.  Medication Adjustments/Labs and Tests Ordered: Current medicines are reviewed at length with the patient today.  Concerns regarding medicines are outlined above.   Tests Ordered: Orders Placed This Encounter  Procedures   ECHOCARDIOGRAM COMPLETE     Medication Changes: No orders of the defined types were placed in this  encounter.   Disposition:  Follow up  6 months.  Signed, Jonelle Sidle, MD, [facility] 08/08/2020 1:24 PM    [facility] at [facility] [address] [place], [place], [place] [address] Phone: [phone]; Fax: [phone]

## 2020-08-08 NOTE — Patient Instructions (Addendum)

## 2020-09-02 ENCOUNTER — Other Ambulatory Visit: Payer: Self-pay

## 2020-09-02 ENCOUNTER — Ambulatory Visit (HOSPITAL_COMMUNITY)
Admission: RE | Admit: 2020-09-02 | Discharge: 2020-09-02 | Disposition: A | Discharge status: Home / Self Care | Payer: Medicare HMO | Source: Ambulatory Visit | Attending: Cardiology | Admitting: Cardiology

## 2020-09-02 DIAGNOSIS — Z953 Presence of xenogenic heart valve: Secondary | ICD-10-CM | POA: Diagnosis present

## 2020-09-02 LAB — ECHOCARDIOGRAM COMPLETE
AR max vel: 1.49 cm2
AV Area VTI: 1.65 cm2
AV Area mean vel: 1.54 cm2
AV Mean grad: 14 mmHg
AV Peak grad: 27.9 mmHg
Ao pk vel: 2.64 m/s
Area-P 1/2: 1.58 cm2
S' Lateral: 2.4 cm

## 2020-09-02 NOTE — Progress Notes (Signed)
*  PRELIMINARY RESULTS* Echocardiogram 2D Echocardiogram has been performed.  Edward Pena 09/02/2020, 1:41 PM

## 2020-09-06 ENCOUNTER — Telehealth: Payer: Self-pay | Admitting: *Deleted

## 2020-09-06 NOTE — Telephone Encounter (Signed)
Patient informed. Copy sent to PCP °

## 2020-09-06 NOTE — Telephone Encounter (Signed)
-----   Message from Jonelle Sidle, MD sent at 09/02/2020  4:32 PM EDT ----- Results reviewed.  LVEF normal at 60 to 65%.  Bioprosthetic AVR is also stable in function compared to last study.  Keep follow-up as scheduled.

## 2020-12-09 DIAGNOSIS — I700 Atherosclerosis of aorta: Secondary | ICD-10-CM | POA: Diagnosis not present

## 2020-12-09 DIAGNOSIS — I10 Essential (primary) hypertension: Secondary | ICD-10-CM | POA: Diagnosis not present

## 2020-12-09 DIAGNOSIS — Z299 Encounter for prophylactic measures, unspecified: Secondary | ICD-10-CM | POA: Diagnosis not present

## 2020-12-09 DIAGNOSIS — J069 Acute upper respiratory infection, unspecified: Secondary | ICD-10-CM | POA: Diagnosis not present

## 2020-12-09 DIAGNOSIS — I270 Primary pulmonary hypertension: Secondary | ICD-10-CM | POA: Diagnosis not present

## 2020-12-18 DIAGNOSIS — Z79899 Other long term (current) drug therapy: Secondary | ICD-10-CM | POA: Diagnosis not present

## 2020-12-18 DIAGNOSIS — R5383 Other fatigue: Secondary | ICD-10-CM | POA: Diagnosis not present

## 2020-12-18 DIAGNOSIS — Z125 Encounter for screening for malignant neoplasm of prostate: Secondary | ICD-10-CM | POA: Diagnosis not present

## 2020-12-18 DIAGNOSIS — Z1331 Encounter for screening for depression: Secondary | ICD-10-CM | POA: Diagnosis not present

## 2020-12-18 DIAGNOSIS — Z299 Encounter for prophylactic measures, unspecified: Secondary | ICD-10-CM | POA: Diagnosis not present

## 2020-12-18 DIAGNOSIS — Z7189 Other specified counseling: Secondary | ICD-10-CM | POA: Diagnosis not present

## 2020-12-18 DIAGNOSIS — E7800 Pure hypercholesterolemia, unspecified: Secondary | ICD-10-CM | POA: Diagnosis not present

## 2020-12-18 DIAGNOSIS — Z1339 Encounter for screening examination for other mental health and behavioral disorders: Secondary | ICD-10-CM | POA: Diagnosis not present

## 2020-12-18 DIAGNOSIS — Z6826 Body mass index (BMI) 26.0-26.9, adult: Secondary | ICD-10-CM | POA: Diagnosis not present

## 2020-12-18 DIAGNOSIS — Z0000 Encounter for general adult medical examination without abnormal findings: Secondary | ICD-10-CM | POA: Diagnosis not present

## 2020-12-18 DIAGNOSIS — I10 Essential (primary) hypertension: Secondary | ICD-10-CM | POA: Diagnosis not present

## 2020-12-23 ENCOUNTER — Encounter: Payer: Self-pay | Admitting: Cardiology

## 2020-12-23 DIAGNOSIS — R7309 Other abnormal glucose: Secondary | ICD-10-CM | POA: Diagnosis not present

## 2020-12-23 DIAGNOSIS — I25119 Atherosclerotic heart disease of native coronary artery with unspecified angina pectoris: Secondary | ICD-10-CM | POA: Diagnosis not present

## 2020-12-23 DIAGNOSIS — I10 Essential (primary) hypertension: Secondary | ICD-10-CM | POA: Diagnosis not present

## 2020-12-23 DIAGNOSIS — D692 Other nonthrombocytopenic purpura: Secondary | ICD-10-CM | POA: Diagnosis not present

## 2020-12-23 DIAGNOSIS — F17210 Nicotine dependence, cigarettes, uncomplicated: Secondary | ICD-10-CM | POA: Diagnosis not present

## 2020-12-23 DIAGNOSIS — I272 Pulmonary hypertension, unspecified: Secondary | ICD-10-CM | POA: Diagnosis not present

## 2020-12-23 DIAGNOSIS — Z299 Encounter for prophylactic measures, unspecified: Secondary | ICD-10-CM | POA: Diagnosis not present

## 2021-01-27 DIAGNOSIS — I25119 Atherosclerotic heart disease of native coronary artery with unspecified angina pectoris: Secondary | ICD-10-CM | POA: Diagnosis not present

## 2021-01-27 DIAGNOSIS — I700 Atherosclerosis of aorta: Secondary | ICD-10-CM | POA: Diagnosis not present

## 2021-01-27 DIAGNOSIS — Z299 Encounter for prophylactic measures, unspecified: Secondary | ICD-10-CM | POA: Diagnosis not present

## 2021-01-27 DIAGNOSIS — M25552 Pain in left hip: Secondary | ICD-10-CM | POA: Diagnosis not present

## 2021-01-27 DIAGNOSIS — I10 Essential (primary) hypertension: Secondary | ICD-10-CM | POA: Diagnosis not present

## 2021-02-11 ENCOUNTER — Ambulatory Visit: Payer: Medicare HMO | Admitting: Cardiology

## 2021-02-12 ENCOUNTER — Encounter: Payer: Self-pay | Admitting: *Deleted

## 2021-02-12 ENCOUNTER — Ambulatory Visit (INDEPENDENT_AMBULATORY_CARE_PROVIDER_SITE_OTHER): Payer: Medicare Other | Admitting: Cardiology

## 2021-02-12 ENCOUNTER — Encounter: Payer: Self-pay | Admitting: Cardiology

## 2021-02-12 VITALS — BP 140/62 | HR 70 | Ht 70.0 in | Wt 172.4 lb

## 2021-02-12 DIAGNOSIS — I25119 Atherosclerotic heart disease of native coronary artery with unspecified angina pectoris: Secondary | ICD-10-CM | POA: Diagnosis not present

## 2021-02-12 DIAGNOSIS — Z953 Presence of xenogenic heart valve: Secondary | ICD-10-CM

## 2021-02-12 NOTE — Patient Instructions (Signed)

## 2021-02-12 NOTE — Progress Notes (Signed)
Cardiology Office Note  Date: 02/12/2021   Edward Pena, DOB 01/03/1927, MRN [id number]  PCP:  Ignatius SpeckingVyas, Dhruv B, MD  Cardiologist:  Nona DellSamuel Chiquitta Matty, MD Electrophysiologist:  None   Chief Complaint  Patient presents with   Cardiac follow-up    History of Present Illness: Edward Pena is a 88 y.o. male last seen in July 2022.  He is here with his wife today for a follow-up visit.  He does not describe any angina symptoms with typical ADLs, no palpitations or breathlessness beyond NYHA class II.  Follow-up echocardiogram in August 2022 revealed LVEF 60 to 65% with mild diastolic dysfunction, normal RVSP 24 mmHg, stable bioprosthetic AV with mean gradient 14 mmHg.  I reviewed his medications which are stable from a cardiac perspective.  He does not report any nitroglycerin use.  Requesting interval lab work from PCP.  Past Medical History:  Diagnosis Date   Aortic regurgitation    Aortic stenosis    Blood transfusion    Carotid artery disease (HCC)    Mild   Coronary atherosclerosis of native coronary artery    DES LAD 10/07   Dysphagia 02/23/2017   Essential hypertension    GERD (gastroesophageal reflux disease)    Mixed hyperlipidemia    Peptic ulcer disease     Past Surgical History:  Procedure Laterality Date   AORTIC VALVE REPLACEMENT  03/12/2011   Procedure: AORTIC VALVE REPLACEMENT (AVR);  Surgeon: Kathlee NationsPeter Van Suann Larryrigt III, MD;  [facility];  Service: Open Heart Surgery;  Laterality: N/A;  Aortic Valve Replacement    APPENDECTOMY     CARPAL TUNNEL RELEASE Left 11/10/2018   Procedure: LEFT CARPAL TUNNEL RELEASE;  Surgeon: Cindee SaltKuzma, Gary, MD;  Location: [facility];  Service: Orthopedics;  Laterality: Left;  AXILLARY BLOCK   CORONARY ARTERY BYPASS GRAFT  03/12/2011   Procedure: CORONARY ARTERY BYPASS GRAFTING (CABG);  Surgeon: Kathlee NationsPeter Van Suann Larryrigt III, MD;  [facility];  Service: Open Heart Surgery;  Laterality: N/A;  Coronary Artery Bypass Graft on pump  times  two utilizing left internal mammary artery and right greater saphenous vein, harvested endoscopically.  Transesophageal echocardiogram    ESOPHAGOGASTRODUODENOSCOPY N/A 12/12/2015   Procedure: ESOPHAGOGASTRODUODENOSCOPY (EGD);  Surgeon: Malissa HippoNajeeb U Rehman, MD;  [facility];  Service: Endoscopy;  Laterality: N/A;  255   ESOPHAGOGASTRODUODENOSCOPY N/A 03/26/2017   Procedure: ESOPHAGOGASTRODUODENOSCOPY (EGD);  Surgeon: Malissa Hippoehman, Najeeb U, MD;  [facility];  Service: Endoscopy;  Laterality: N/A;  10:40   FASCIECTOMY Left 11/10/2018   Procedure: SEGMENTAL FASCIECTOMY LEFT RING FINGER AND LEFT MIDDLE FINGER;  Surgeon: Cindee SaltKuzma, Gary, MD;  Location: [facility];  Service: Orthopedics;  Laterality: Left;   YAG LASER APPLICATION Left 01/15/2014   Procedure: YAG LASER APPLICATION;  Surgeon: Susa Simmondsarroll F Haines, MD;  [facility]: [facility];  Service: Ophthalmology;  Laterality: Left;   YAG LASER APPLICATION Right 02/12/2014   Procedure: YAG LASER APPLICATION;  Surgeon: Susa Simmondsarroll F Haines, MD;  Location: [facility];  Service: Ophthalmology;  Laterality: Right;    Current Outpatient Medications  Medication Sig Dispense Refill   amLODipine-benazepril (LOTREL) 5-40 MG capsule Take 1 capsule by mouth daily.     aspirin EC 81 MG tablet Take 1 tablet (81 mg total) by mouth 3 (three) times a week.     atorvastatin (LIPITOR) 20 MG tablet Take 20 mg by mouth. Takes 2-3 x per week     Glycerin-Hypromellose-PEG 400 (VISINE TEARS OP) Apply 1 drop  to eye as needed.     lansoprazole (PREVACID) 15 MG capsule Take 15 mg by mouth as needed.     Multiple Vitamin (MULTIVITAMIN) tablet Take 1 tablet by mouth daily.       nitroGLYCERIN (NITROSTAT) 0.4 MG SL tablet Place 1 tablet (0.4 mg total) under the tongue every 5 (five) minutes x 3 doses as needed for chest pain (if no relief after 3rd dose proceed to the ED for an evaluation). 25 tablet 3   predniSONE (DELTASONE) 10 MG tablet Take 10 mg by mouth 2 (two)  times daily. X 14 days     gabapentin (NEURONTIN) 100 MG capsule Take 100 mg by mouth at bedtime. (Patient not taking: Reported on 02/12/2021)     Polyethylene Glycol 400 (BLINK TEARS OP) Place 2 drops into both eyes daily as needed (for dry eyes). (Patient not taking: Reported on 02/12/2021)     No current facility-administered medications for this visit.   Allergies:  Patient has no known allergies.   ROS: No orthopnea or PND.  Physical Exam: VS:  BP 140/62    Pulse 70    Ht 5\' 10"  (1.778 m)    Wt 172 lb 6.4 oz (78.2 kg)    SpO2 98%    BMI 24.74 kg/m , BMI Body mass index is 24.74 kg/m.  Wt Readings from Last 3 Encounters:  02/12/21 172 lb 6.4 oz (78.2 kg)  08/08/20 176 lb 6.4 oz (80 kg)  12/29/19 172 lb 12.8 oz (78.4 kg)    General: Patient appears comfortable at rest. HEENT: Conjunctiva and lids normal, wearing a mask. Neck: Supple, no elevated JVP or carotid bruits, no thyromegaly. Lungs: Clear to auscultation, nonlabored breathing at rest. Cardiac: Regular rate and rhythm, no S3, 2-3/6 systolic murmur, no pericardial rub. Extremities: No pitting edema.  ECG:  An ECG dated 08/08/2020 was personally reviewed today and demonstrated:  Sinus rhythm with prolonged PR interval.  Recent Labwork:  No interval lab work for review today.  Other Studies Reviewed Today:  Echocardiogram 09/02/2020:  1. Left ventricular ejection fraction, by estimation, is 60 to 65%. The  left ventricle has normal function. The left ventricle has no regional  wall motion abnormalities. There is mild left ventricular hypertrophy.  Left ventricular diastolic parameters  are consistent with Grade I diastolic dysfunction (impaired relaxation).   2. Right ventricular systolic function is normal. The right ventricular  size is normal. There is normal pulmonary artery systolic pressure. The  estimated right ventricular systolic pressure is 24.0 mmHg.   3. Left atrial size was upper normal.   4. Right atrial  size was upper normal.   5. The mitral valve is abnormal. Trivial mitral valve regurgitation.   6. The aortic valve has been repaired/replaced. Aortic valve  regurgitation is not visualized. There is a 21 mm Magna Ease valve present  in the aortic position. Aortic valve mean gradient measures 14.0 mmHg.   7. The inferior vena cava is normal in size with greater than 50%  respiratory variability, suggesting right atrial pressure of 3 mmHg.   Assessment and Plan:  1.  Multivessel CAD status post CABG in 2013.  He is doing well without active angina symptoms on medical therapy.  Continue observation for now.  He is on aspirin, Lipitor, and as needed nitroglycerin.  2.  Aortic stenosis status post bioprosthetic AVR at the time of CABG in 2013.  Follow-up echocardiogram in August of last year showed normal valve function with mean gradient  14 mmHg and no paravalvular regurgitation.  Medication Adjustments/Labs and Tests Ordered: Current medicines are reviewed at length with the patient today.  Concerns regarding medicines are outlined above.   Tests Ordered: No orders of the defined types were placed in this encounter.   Medication Changes: No orders of the defined types were placed in this encounter.   Disposition:  Follow up  6 months.  Signed, Jonelle Sidle, MD, [facility] 02/12/2021 2:50 PM    [facility] at [facility] [address] [place], [place], [place] [address] Phone: [phone]; Fax: [phone]

## 2021-03-26 DIAGNOSIS — R7309 Other abnormal glucose: Secondary | ICD-10-CM | POA: Diagnosis not present

## 2021-03-26 DIAGNOSIS — M546 Pain in thoracic spine: Secondary | ICD-10-CM | POA: Diagnosis not present

## 2021-03-26 DIAGNOSIS — Z299 Encounter for prophylactic measures, unspecified: Secondary | ICD-10-CM | POA: Diagnosis not present

## 2021-03-26 DIAGNOSIS — I10 Essential (primary) hypertension: Secondary | ICD-10-CM | POA: Diagnosis not present

## 2021-07-21 DIAGNOSIS — M169 Osteoarthritis of hip, unspecified: Secondary | ICD-10-CM | POA: Diagnosis not present

## 2021-07-21 DIAGNOSIS — R600 Localized edema: Secondary | ICD-10-CM | POA: Diagnosis not present

## 2021-07-21 DIAGNOSIS — I10 Essential (primary) hypertension: Secondary | ICD-10-CM | POA: Diagnosis not present

## 2021-07-21 DIAGNOSIS — Z299 Encounter for prophylactic measures, unspecified: Secondary | ICD-10-CM | POA: Diagnosis not present

## 2021-07-21 DIAGNOSIS — R7309 Other abnormal glucose: Secondary | ICD-10-CM | POA: Diagnosis not present

## 2021-08-21 ENCOUNTER — Ambulatory Visit (INDEPENDENT_AMBULATORY_CARE_PROVIDER_SITE_OTHER): Payer: Medicare Other | Admitting: Cardiology

## 2021-08-21 ENCOUNTER — Encounter: Payer: Self-pay | Admitting: Cardiology

## 2021-08-21 VITALS — BP 138/60 | HR 65 | Ht 70.0 in | Wt 171.6 lb

## 2021-08-21 DIAGNOSIS — Z953 Presence of xenogenic heart valve: Secondary | ICD-10-CM

## 2021-08-21 DIAGNOSIS — I25119 Atherosclerotic heart disease of native coronary artery with unspecified angina pectoris: Secondary | ICD-10-CM

## 2021-08-21 DIAGNOSIS — I10 Essential (primary) hypertension: Secondary | ICD-10-CM

## 2021-08-21 NOTE — Progress Notes (Signed)
Cardiology Office Note  Date: 08/21/2021   ID: Fortino, Haag 05-30-1926, MRN [id number]  PCP:  Ignatius Specking, MD  Cardiologist:  Nona Dell, MD Electrophysiologist:  None   Chief Complaint  Patient presents with   Cardiac follow-up    History of Present Illness: Edward Pena is a 88 y.o. male last seen in February.  He is here with his wife for a follow-up visit.  He does not report any angina or nitroglycerin use in the interim.  NYHA class II dyspnea.  He is limited by chronic back pain.  He has had no palpitations or sudden syncope.  I reviewed his medications which are outlined below.  He continues to follow with Dr. Sherril Croon and will have his physical with lab work later in the year.  I personally reviewed his ECG today which shows sinus rhythm with prolonged PR interval and leftward axis.  Past Medical History:  Diagnosis Date   Aortic regurgitation    Aortic stenosis    Blood transfusion    Carotid artery disease (HCC)    Mild   Coronary atherosclerosis of native coronary artery    DES LAD 10/07   Dysphagia 02/23/2017   Essential hypertension    GERD (gastroesophageal reflux disease)    Mixed hyperlipidemia    Peptic ulcer disease     Past Surgical History:  Procedure Laterality Date   AORTIC VALVE REPLACEMENT  03/12/2011   Procedure: AORTIC VALVE REPLACEMENT (AVR);  Surgeon: Kathlee Nations Suann Larry, MD;  Location: [facility] OR;  Service: Open Heart Surgery;  Laterality: N/A;  Aortic Valve Replacement    APPENDECTOMY     CARPAL TUNNEL RELEASE Left 11/10/2018   Procedure: LEFT CARPAL TUNNEL RELEASE;  Surgeon: Cindee Salt, MD;  Location: [facility];  Service: Orthopedics;  Laterality: Left;  AXILLARY BLOCK   CORONARY ARTERY BYPASS GRAFT  03/12/2011   Procedure: CORONARY ARTERY BYPASS GRAFTING (CABG);  Surgeon: Kathlee Nations Suann Larry, MD;  Location: [facility] OR;  Service: Open Heart Surgery;  Laterality: N/A;  Coronary Artery Bypass Graft on pump times  two  utilizing left internal mammary artery and right greater saphenous vein, harvested endoscopically.  Transesophageal echocardiogram    ESOPHAGOGASTRODUODENOSCOPY N/A 12/12/2015   Procedure: ESOPHAGOGASTRODUODENOSCOPY (EGD);  Surgeon: Malissa Hippo, MD;  [facility];  Service: Endoscopy;  Laterality: N/A;  255   ESOPHAGOGASTRODUODENOSCOPY N/A 03/26/2017   Procedure: ESOPHAGOGASTRODUODENOSCOPY (EGD);  Surgeon: Malissa Hippo, MD;  [facility];  Service: Endoscopy;  Laterality: N/A;  10:40   FASCIECTOMY Left 11/10/2018   Procedure: SEGMENTAL FASCIECTOMY LEFT RING FINGER AND LEFT MIDDLE FINGER;  Surgeon: Cindee Salt, MD;  Location: [facility];  Service: Orthopedics;  Laterality: Left;   YAG LASER APPLICATION Left 01/15/2014   Procedure: YAG LASER APPLICATION;  Surgeon: Susa Simmonds, MD;  Location: [facility];  Service: Ophthalmology;  Laterality: Left;   YAG LASER APPLICATION Right 02/12/2014   Procedure: YAG LASER APPLICATION;  Surgeon: Susa Simmonds, MD;  Location: [facility];  Service: Ophthalmology;  Laterality: Right;    Current Outpatient Medications  Medication Sig Dispense Refill   amLODipine-benazepril (LOTREL) 5-40 MG capsule Take 1 capsule by mouth daily.     aspirin EC 81 MG tablet Take 1 tablet (81 mg total) by mouth 3 (three) times a week.     atorvastatin (LIPITOR) 20 MG tablet Take 20 mg by mouth. Takes 2-3 x per week     gabapentin (  NEURONTIN) 100 MG capsule Take 100 mg by mouth at bedtime.     Glycerin-Hypromellose-PEG 400 (VISINE TEARS OP) Apply 1 drop to eye as needed.     lansoprazole (PREVACID) 15 MG capsule Take 15 mg by mouth as needed.     Multiple Vitamin (MULTIVITAMIN) tablet Take 1 tablet by mouth daily.       nitroGLYCERIN (NITROSTAT) 0.4 MG SL tablet Place 1 tablet (0.4 mg total) under the tongue every 5 (five) minutes x 3 doses as needed for chest pain (if no relief after 3rd dose proceed to the ED for an evaluation). 25 tablet 3    Polyethylene Glycol 400 (BLINK TEARS OP) Place 2 drops into both eyes daily as needed (for dry eyes).     predniSONE (DELTASONE) 10 MG tablet Take 10 mg by mouth 2 (two) times daily. X 14 days (Patient not taking: Reported on 08/21/2021)     No current facility-administered medications for this visit.   Allergies:  Patient has no known allergies.   ROS: Hearing loss.  No orthopnea or PND.  Physical Exam: VS:  BP 138/60   Pulse 65   Ht 5\' 10"  (1.778 m)   Wt 171 lb 9.6 oz (77.8 kg)   SpO2 94%   BMI 24.62 kg/m , BMI Body mass index is 24.62 kg/m.  Wt Readings from Last 3 Encounters:  08/21/21 171 lb 9.6 oz (77.8 kg)  02/12/21 172 lb 6.4 oz (78.2 kg)  08/08/20 176 lb 6.4 oz (80 kg)    General: Patient appears comfortable at rest. HEENT: Conjunctiva and lids normal. Neck: Supple, no elevated JVP or carotid bruits, no thyromegaly. Lungs: Clear to auscultation, nonlabored breathing at rest. Cardiac: Regular rate and rhythm, no S3, 2/6 systolic murmur, no pericardial rub. Extremities: No pitting edema.  ECG:  An ECG dated 08/08/2020 was personally reviewed today and demonstrated:  Sinus rhythm with prolonged PR interval.  Recent Labwork:  December 2022: Hemoglobin 12.9, platelets 260, TSH 1.62, cholesterol 195, triglycerides 52, HDL 75, LDL 110, BUN 17, creatinine 1.02, potassium 4.6, AST 22, ALT 14  Other Studies Reviewed Today:  Echocardiogram 09/02/2020:  1. Left ventricular ejection fraction, by estimation, is 60 to 65%. The  left ventricle has normal function. The left ventricle has no regional  wall motion abnormalities. There is mild left ventricular hypertrophy.  Left ventricular diastolic parameters  are consistent with Grade I diastolic dysfunction (impaired relaxation).   2. Right ventricular systolic function is normal. The right ventricular  size is normal. There is normal pulmonary artery systolic pressure. The  estimated right ventricular systolic pressure is 24.0  mmHg.   3. Left atrial size was upper normal.   4. Right atrial size was upper normal.   5. The mitral valve is abnormal. Trivial mitral valve regurgitation.   6. The aortic valve has been repaired/replaced. Aortic valve  regurgitation is not visualized. There is a 21 mm Magna Ease valve present  in the aortic position. Aortic valve mean gradient measures 14.0 mmHg.   7. The inferior vena cava is normal in size with greater than 50%  respiratory variability, suggesting right atrial pressure of 3 mmHg.   Assessment and Plan:  1.  Multivessel CAD status post CABG in 2013.  He does not describe any angina or nitroglycerin use with typical activities, NYHA class II dyspnea.  I reviewed his ECG today.  Continue aspirin, Lipitor, and as needed nitroglycerin.  2.  Essential hypertension, on Lotrel.  Blood pressure control is  reasonable today.  No changes were made.  3.  History of aortic stenosis status post bioprosthetic AVR in 2013.  Follow-up echocardiogram from last year showed LVEF 60 to 65% with stable bioprosthetic AVR and mean gradient 14 mmHg.  Medication Adjustments/Labs and Tests Ordered: Current medicines are reviewed at length with the patient today.  Concerns regarding medicines are outlined above.   Tests Ordered: Orders Placed This Encounter  Procedures   EKG 12-Lead    Medication Changes: No orders of the defined types were placed in this encounter.   Disposition:  Follow up  6 months.  Signed, Jonelle Sidle, MD, [facility] 08/21/2021 2:39 PM    [facility] at [facility] [address] [place], [place], [place] [address] Phone: [phone]; Fax: [phone]

## 2021-08-21 NOTE — Patient Instructions (Addendum)

## 2021-08-26 DIAGNOSIS — H43813 Vitreous degeneration, bilateral: Secondary | ICD-10-CM | POA: Diagnosis not present

## 2021-08-28 DIAGNOSIS — Z299 Encounter for prophylactic measures, unspecified: Secondary | ICD-10-CM | POA: Diagnosis not present

## 2021-08-28 DIAGNOSIS — I270 Primary pulmonary hypertension: Secondary | ICD-10-CM | POA: Diagnosis not present

## 2021-08-28 DIAGNOSIS — R251 Tremor, unspecified: Secondary | ICD-10-CM | POA: Diagnosis not present

## 2021-08-28 DIAGNOSIS — L821 Other seborrheic keratosis: Secondary | ICD-10-CM | POA: Diagnosis not present

## 2021-12-22 DIAGNOSIS — Z789 Other specified health status: Secondary | ICD-10-CM | POA: Diagnosis not present

## 2021-12-22 DIAGNOSIS — Z6826 Body mass index (BMI) 26.0-26.9, adult: Secondary | ICD-10-CM | POA: Diagnosis not present

## 2021-12-22 DIAGNOSIS — Z1331 Encounter for screening for depression: Secondary | ICD-10-CM | POA: Diagnosis not present

## 2021-12-22 DIAGNOSIS — Z1339 Encounter for screening examination for other mental health and behavioral disorders: Secondary | ICD-10-CM | POA: Diagnosis not present

## 2021-12-22 DIAGNOSIS — R5383 Other fatigue: Secondary | ICD-10-CM | POA: Diagnosis not present

## 2021-12-22 DIAGNOSIS — Z125 Encounter for screening for malignant neoplasm of prostate: Secondary | ICD-10-CM | POA: Diagnosis not present

## 2021-12-22 DIAGNOSIS — Z0000 Encounter for general adult medical examination without abnormal findings: Secondary | ICD-10-CM | POA: Diagnosis not present

## 2021-12-22 DIAGNOSIS — Z299 Encounter for prophylactic measures, unspecified: Secondary | ICD-10-CM | POA: Diagnosis not present

## 2021-12-22 DIAGNOSIS — Z7189 Other specified counseling: Secondary | ICD-10-CM | POA: Diagnosis not present

## 2021-12-22 DIAGNOSIS — E7800 Pure hypercholesterolemia, unspecified: Secondary | ICD-10-CM | POA: Diagnosis not present

## 2021-12-22 DIAGNOSIS — I10 Essential (primary) hypertension: Secondary | ICD-10-CM | POA: Diagnosis not present

## 2021-12-22 DIAGNOSIS — Z79899 Other long term (current) drug therapy: Secondary | ICD-10-CM | POA: Diagnosis not present

## 2022-03-03 DIAGNOSIS — H353132 Nonexudative age-related macular degeneration, bilateral, intermediate dry stage: Secondary | ICD-10-CM | POA: Diagnosis not present

## 2022-03-11 ENCOUNTER — Ambulatory Visit: Payer: Medicare Other | Attending: Cardiology | Admitting: Cardiology

## 2022-03-11 ENCOUNTER — Encounter: Payer: Self-pay | Admitting: Cardiology

## 2022-03-11 ENCOUNTER — Ambulatory Visit: Payer: Medicare Other | Admitting: Cardiology

## 2022-03-11 VITALS — BP 158/61 | HR 70 | Ht 69.0 in | Wt 169.0 lb

## 2022-03-11 DIAGNOSIS — Z953 Presence of xenogenic heart valve: Secondary | ICD-10-CM | POA: Insufficient documentation

## 2022-03-11 DIAGNOSIS — Z79899 Other long term (current) drug therapy: Secondary | ICD-10-CM

## 2022-03-11 DIAGNOSIS — I10 Essential (primary) hypertension: Secondary | ICD-10-CM | POA: Insufficient documentation

## 2022-03-11 DIAGNOSIS — I25119 Atherosclerotic heart disease of native coronary artery with unspecified angina pectoris: Secondary | ICD-10-CM | POA: Diagnosis not present

## 2022-03-11 MED ORDER — HYDROCHLOROTHIAZIDE 12.5 MG PO CAPS: 12.5000 mg | ORAL_CAPSULE | Freq: Every day | ORAL | 1 refills | Status: DC

## 2022-03-11 NOTE — Progress Notes (Signed)
Cardiology Office Note  Date: 03/11/2022   ID: Edward Pena, Edward Pena [birth year], MRN [id number]  History of Present Illness: Edward Pena is a 89 y.o. male last seen in August 2023.  He is here today with his wife and an Environmental consultant.  He does not report any chest pain or palpitations, no dizziness or syncope.  Does report ankle edema that tends to be worse as he is up on his feet during the day.  I reviewed his medications which are stable from a cardiac perspective.  He is on Lotrel, amlodipine component could be a contributor to his leg swelling to some degree.  Not on a diuretic at this point.  His echocardiogram from 2022 showed LVEF 60 to 65% with mild diastolic dysfunction, normally functioning aortic bioprosthetic valve.  I reviewed his lab work from December.  Renal function and potassium were normal.  Physical Exam: VS:  BP (!) 158/61   Pulse 70   Ht '5\' 9"'$  (1.753 m)   Wt 169 lb (76.7 kg)   SpO2 97%   BMI 24.96 kg/m , BMI Body mass index is 24.96 kg/m.  Wt Readings from Last 3 Encounters:  03/11/22 169 lb (76.7 kg)  08/21/21 171 lb 9.6 oz (77.8 kg)  02/12/21 172 lb 6.4 oz (78.2 kg)    General: Patient appears comfortable at rest. HEENT: Conjunctiva and lids normal. Neck: Supple, no elevated JVP or carotid bruits. Lungs: Clear to auscultation, nonlabored breathing at rest. Cardiac: Regular rate and rhythm, no S3, 2/6 systolic murmur. Extremities: 1-2+ bilateral ankle edema..  ECG:  An ECG dated 08/21/2021 was personally reviewed today and demonstrated:  Sinus rhythm with prolonged PR interval and leftward axis.  Labwork:  December 2023: Hemoglobin 12.8, platelets 236, BUN 16, creatinine 0.94, potassium 4.8, AST 19, ALT 13, cholesterol 165, triglycerides 76, HDL 66, LDL 85, TSH 2.08  Other Studies Reviewed Today:  Echocardiogram 09/02/2020:  1. Left ventricular ejection fraction, by estimation, is 60 to 65%. The  left ventricle has normal function. The left ventricle  has no regional  wall motion abnormalities. There is mild left ventricular hypertrophy.  Left ventricular diastolic parameters  are consistent with Grade I diastolic dysfunction (impaired relaxation).   2. Right ventricular systolic function is normal. The right ventricular  size is normal. There is normal pulmonary artery systolic pressure. The  estimated right ventricular systolic pressure is 0000000 mmHg.   3. Left atrial size was upper normal.   4. Right atrial size was upper normal.   5. The mitral valve is abnormal. Trivial mitral valve regurgitation.   6. The aortic valve has been repaired/replaced. Aortic valve  regurgitation is not visualized. There is a 21 mm Magna Ease valve present  in the aortic position. Aortic valve mean gradient measures 14.0 mmHg.   7. The inferior vena cava is normal in size with greater than 50%  respiratory variability, suggesting right atrial pressure of 3 mmHg.   Assessment and Plan:  1.  Multivessel CAD status post DES to the LAD in 2007 and subsequently CABG in 2013.  Echocardiogram in August 2022 revealed normal LVEF at 60 to 65%.  He does not report any angina with typical ADLs.  Plan to continue aspirin, Lipitor, and as needed nitroglycerin.  2.  History of aortic stenosis status post bioprosthetic AVR in 2013, 21 mm Magna Ease prosthesis.  Echocardiogram in August 2022 showed mean gradient 14 mmHg.  No change in heart murmur.  3.  Essential hypertension.  Blood pressure elevated today.  He is on Lotrel, plan to add HCTZ 12.5 mg daily particularly with ankle edema.  Recheck BMET in 2 weeks.  May need potassium supplement eventually.  Disposition:  Follow up  6 months.  Signed, Satira Sark, M.D., F.A.C.C.

## 2022-03-11 NOTE — Patient Instructions (Addendum)
Medication Instructions:  Your physician has recommended you make the following change in your medication:  Start hydrochlorothiazide 12.5 mg daily Continue other medications the same  Labwork: BMET in 2 weeks (03/25/2022) Non-fasting [facility]  Testing/Procedures: none  Follow-Up: Your physician recommends that you schedule a follow-up appointment in: 6 months  Any Other Special Instructions Will Be Listed Below (If Applicable).  If you need a refill on your cardiac medications before your next appointment, please call your pharmacy.

## 2022-03-25 DIAGNOSIS — I10 Essential (primary) hypertension: Secondary | ICD-10-CM | POA: Diagnosis not present

## 2022-03-25 DIAGNOSIS — Z79899 Other long term (current) drug therapy: Secondary | ICD-10-CM | POA: Diagnosis not present

## 2022-03-27 DIAGNOSIS — I272 Pulmonary hypertension, unspecified: Secondary | ICD-10-CM | POA: Diagnosis not present

## 2022-03-27 DIAGNOSIS — I10 Essential (primary) hypertension: Secondary | ICD-10-CM | POA: Diagnosis not present

## 2022-03-27 DIAGNOSIS — I700 Atherosclerosis of aorta: Secondary | ICD-10-CM | POA: Diagnosis not present

## 2022-03-27 DIAGNOSIS — I25119 Atherosclerotic heart disease of native coronary artery with unspecified angina pectoris: Secondary | ICD-10-CM | POA: Diagnosis not present

## 2022-03-27 DIAGNOSIS — Z299 Encounter for prophylactic measures, unspecified: Secondary | ICD-10-CM | POA: Diagnosis not present

## 2022-04-15 DIAGNOSIS — I25119 Atherosclerotic heart disease of native coronary artery with unspecified angina pectoris: Secondary | ICD-10-CM | POA: Diagnosis not present

## 2022-04-15 DIAGNOSIS — Z299 Encounter for prophylactic measures, unspecified: Secondary | ICD-10-CM | POA: Diagnosis not present

## 2022-04-15 DIAGNOSIS — I10 Essential (primary) hypertension: Secondary | ICD-10-CM | POA: Diagnosis not present

## 2022-05-13 DIAGNOSIS — H6592 Unspecified nonsuppurative otitis media, left ear: Secondary | ICD-10-CM | POA: Diagnosis not present

## 2022-05-13 DIAGNOSIS — H903 Sensorineural hearing loss, bilateral: Secondary | ICD-10-CM | POA: Diagnosis not present

## 2022-05-13 DIAGNOSIS — I10 Essential (primary) hypertension: Secondary | ICD-10-CM | POA: Diagnosis not present

## 2022-06-02 DIAGNOSIS — H903 Sensorineural hearing loss, bilateral: Secondary | ICD-10-CM | POA: Diagnosis not present

## 2022-06-02 DIAGNOSIS — M1990 Unspecified osteoarthritis, unspecified site: Secondary | ICD-10-CM | POA: Diagnosis not present

## 2022-06-02 DIAGNOSIS — I10 Essential (primary) hypertension: Secondary | ICD-10-CM | POA: Diagnosis not present

## 2022-06-02 DIAGNOSIS — H9319 Tinnitus, unspecified ear: Secondary | ICD-10-CM | POA: Diagnosis not present

## 2022-06-30 DIAGNOSIS — M169 Osteoarthritis of hip, unspecified: Secondary | ICD-10-CM | POA: Diagnosis not present

## 2022-06-30 DIAGNOSIS — I10 Essential (primary) hypertension: Secondary | ICD-10-CM | POA: Diagnosis not present

## 2022-06-30 DIAGNOSIS — I270 Primary pulmonary hypertension: Secondary | ICD-10-CM | POA: Diagnosis not present

## 2022-06-30 DIAGNOSIS — Z299 Encounter for prophylactic measures, unspecified: Secondary | ICD-10-CM | POA: Diagnosis not present

## 2022-07-10 ENCOUNTER — Ambulatory Visit: Payer: Medicare Other | Attending: Nurse Practitioner | Admitting: Nurse Practitioner

## 2022-07-10 ENCOUNTER — Encounter: Payer: Self-pay | Admitting: Nurse Practitioner

## 2022-07-10 VITALS — BP 130/60 | HR 76 | Wt 171.6 lb

## 2022-07-10 DIAGNOSIS — R600 Localized edema: Secondary | ICD-10-CM | POA: Insufficient documentation

## 2022-07-10 DIAGNOSIS — E785 Hyperlipidemia, unspecified: Secondary | ICD-10-CM

## 2022-07-10 DIAGNOSIS — Z79899 Other long term (current) drug therapy: Secondary | ICD-10-CM | POA: Diagnosis not present

## 2022-07-10 DIAGNOSIS — I10 Essential (primary) hypertension: Secondary | ICD-10-CM | POA: Diagnosis not present

## 2022-07-10 DIAGNOSIS — Z952 Presence of prosthetic heart valve: Secondary | ICD-10-CM | POA: Diagnosis not present

## 2022-07-10 DIAGNOSIS — R011 Cardiac murmur, unspecified: Secondary | ICD-10-CM | POA: Diagnosis not present

## 2022-07-10 DIAGNOSIS — I2510 Atherosclerotic heart disease of native coronary artery without angina pectoris: Secondary | ICD-10-CM | POA: Insufficient documentation

## 2022-07-10 NOTE — Patient Instructions (Addendum)
Medication Instructions:  Your physician recommends that you continue on your current medications as directed. Please refer to the Current Medication list given to you today.  Labwork: CMET in 1 week at [facility]  Testing/Procedures: Your physician has requested that you have an echocardiogram. Echocardiography is a painless test that uses sound waves to create images of your heart. It provides your doctor with information about the size and shape of your heart and how well your heart's chambers and valves are working. This procedure takes approximately one hour. There are no restrictions for this procedure. Please do NOT wear cologne, perfume, aftershave, or lotions (deodorant is allowed). Please arrive 15 minutes prior to your appointment time.  Follow-Up: Your physician recommends that you schedule a follow-up appointment in: Follow up with Diona Browner as planned  Any Other Special Instructions Will Be Listed Below (If Applicable).  If you need a refill on your cardiac medications before your next appointment, please call your pharmacy.

## 2022-07-10 NOTE — Progress Notes (Addendum)
Cardiology Office Note:  .   Date:  07/10/2022  ID:  Edward Pena, DOB September 12, 1926, MRN [id number] PCP: Ignatius Specking, MD  [facility] Providers Cardiologist:  Nona Dell, MD    History of Present Illness: .   Edward Pena is a 89 y.o. male with a PMH of multivessel CAD, s/p CABG in 2013, aortic valve stenosis, s/p bioprosthetic AVR in 2013, hypertension, mixed hyperlipidemia, GERD, and PUD, who presents today for scheduled follow-up.  Last seen by Dr. Diona Browner on March 11, 2022.  He was doing well at the time.  Today presents for scheduled follow-up.  He states he is doing well. Was told to follow-up with cardiology, PCP heard leaky valve. Denies any recent chest pain, shortness of breath, palpitations, syncope, presyncope, dizziness, orthopnea, PND,  significant weight changes, acute bleeding, or claudication. Occasional leg edema, goes down by end of day, not bothersome per his report.   Studies Reviewed: .    Echo 08/2020:  1. Left ventricular ejection fraction, by estimation, is 60 to 65%. The  left ventricle has normal function. The left ventricle has no regional  wall motion abnormalities. There is mild left ventricular hypertrophy.  Left ventricular diastolic parameters  are consistent with Grade I diastolic dysfunction (impaired relaxation).   2. Right ventricular systolic function is normal. The right ventricular  size is normal. There is normal pulmonary artery systolic pressure. The  estimated right ventricular systolic pressure is 24.0 mmHg.   3. Left atrial size was upper normal.   4. Right atrial size was upper normal.   5. The mitral valve is abnormal. Trivial mitral valve regurgitation.   6. The aortic valve has been repaired/replaced. Aortic valve  regurgitation is not visualized. There is a 21 mm Magna Ease valve present  in the aortic position. Aortic valve mean gradient measures 14.0 mmHg.   7. The inferior vena cava is normal in size with greater  than 50%  respiratory variability, suggesting right atrial pressure of 3 mmHg.   Comparison(s): Prior images unable to be directly viewed, comparison made  by report only. No significant change in bioprosthetic function.  Physical Exam:   VS:  BP 130/60   Pulse 76   Wt 171 lb 9.6 oz (77.8 kg)   SpO2 96%   BMI 25.34 kg/m    Wt Readings from Last 3 Encounters:  07/10/22 171 lb 9.6 oz (77.8 kg)  03/11/22 169 lb (76.7 kg)  08/21/21 171 lb 9.6 oz (77.8 kg)    GEN: Well nourished, well developed in no acute distress NECK: No JVD; No carotid bruits CARDIAC: S1/S2, RRR, Grade 2/6 murmur, no rubs, no gallops RESPIRATORY:  Clear to auscultation without rales, wheezing or rhonchi  EXTREMITIES:  Nonpitting edema to BLE; No deformity   ASSESSMENT AND PLAN: .    AS, s/p bioprosthetic AVR in 2013, murmur, medication management Echo 08/2020 revealed stable bioprosthetic valve, mean gradient of 14 mmHg.  Grade 2/6 murmur on exam.  Will update echocardiogram at this time.  No medication changes.  SBE prophylaxis discussed.  Will obtain CMET for evaluation of medication management.  2. Multivessel CAD, s/p CABG in 2013 Stable with no anginal symptoms. No indication for ischemic evaluation.  Continue current medication regimen. Heart healthy diet and regular cardiovascular exercise encouraged.   3. HTN BP stable and at goal. Discussed to monitor BP at home at least 2 hours after medications and sitting for 5-10 minutes.  No medication changes at this time.  4. HLD LDL 85 12/2021. No medication changes at this time.Heart healthy diet and regular cardiovascular exercise encouraged. If no improvement seen with future labs arranged by PCP later this year, recommend/consider starting Zetia for further LDL lowering.   5. Leg edema Stable per his report, not bothersome. Recommended leg elevation and wearing compression stockings PRN. Heart healthy diet and regular cardiovascular exercise encouraged.    Dispo: Patient states he already has scheduled office visit with Dr. Diona Browner in September, requesting to keep this appt. Follow-up with Dr. Diona Browner in September.   Signed, Sharlene Dory, NP

## 2022-07-17 DIAGNOSIS — Z79899 Other long term (current) drug therapy: Secondary | ICD-10-CM | POA: Diagnosis not present

## 2022-07-20 ENCOUNTER — Ambulatory Visit: Payer: Medicare Other | Attending: Nurse Practitioner

## 2022-07-20 DIAGNOSIS — R011 Cardiac murmur, unspecified: Secondary | ICD-10-CM | POA: Insufficient documentation

## 2022-07-21 LAB — ECHOCARDIOGRAM COMPLETE
AR max vel: 1.3 cm2
AV Area VTI: 1.17 cm2
AV Area mean vel: 1.32 cm2
AV Mean grad: 18.2 mmHg
AV Peak grad: 23 mmHg
Ao pk vel: 2.4 m/s
Area-P 1/2: 1.7 cm2
Calc EF: 74.9 %
MV VTI: 1.32 cm2
P 1/2 time: 404 msec
S' Lateral: 2.8 cm
Single Plane A2C EF: 69.7 %
Single Plane A4C EF: 75.3 %

## 2022-08-04 ENCOUNTER — Other Ambulatory Visit: Payer: Self-pay | Admitting: *Deleted

## 2022-08-04 MED ORDER — HYDROCHLOROTHIAZIDE 12.5 MG PO CAPS: 12.5000 mg | ORAL_CAPSULE | Freq: Every day | ORAL | 1 refills | Status: DC

## 2022-08-06 ENCOUNTER — Ambulatory Visit: Payer: Medicare Other | Admitting: Nurse Practitioner

## 2022-09-15 ENCOUNTER — Encounter: Payer: Self-pay | Admitting: Cardiology

## 2022-09-15 ENCOUNTER — Ambulatory Visit: Payer: Medicare Other | Attending: Cardiology | Admitting: Cardiology

## 2022-09-15 VITALS — BP 150/60 | HR 75 | Ht 70.0 in | Wt 173.6 lb

## 2022-09-15 DIAGNOSIS — I25119 Atherosclerotic heart disease of native coronary artery with unspecified angina pectoris: Secondary | ICD-10-CM | POA: Diagnosis not present

## 2022-09-15 DIAGNOSIS — Z952 Presence of prosthetic heart valve: Secondary | ICD-10-CM | POA: Insufficient documentation

## 2022-09-15 DIAGNOSIS — I10 Essential (primary) hypertension: Secondary | ICD-10-CM | POA: Diagnosis not present

## 2022-09-15 NOTE — Progress Notes (Signed)
    Cardiology Office Note  Date: 09/15/2022   ID: Edward Pena, DOB 26-Nov-1926, MRN [id number]  History of Present Illness: Edward Pena is a 89 y.o. male last seen in June by Ms. Philis Nettle NP, I reviewed the note.  He is here today for a follow-up visit.  Still lives in his own home, his wife is in a nursing facility in [place] at this time.  He does not report any angina, stable NYHA class II dyspnea with typical activities.  I reviewed his medications, he does not report any nitroglycerin use.  Otherwise continues on aspirin, Lipitor, Lotrel and HCTZ.  ECG today shows sinus rhythm with prolonged PR interval, left anterior fascicular block.  He continues to follow with Dr. Sherril Croon.  Physical Exam: VS:  BP (!) 150/60 (BP Location: Right Arm)   Pulse 75   Ht 5\' 10"  (1.778 m)   Wt 173 lb 9.6 oz (78.7 kg)   SpO2 98%   BMI 24.91 kg/m , BMI Body mass index is 24.91 kg/m.  Wt Readings from Last 3 Encounters:  09/15/22 173 lb 9.6 oz (78.7 kg)  07/10/22 171 lb 9.6 oz (77.8 kg)  03/11/22 169 lb (76.7 kg)    General: Patient appears comfortable at rest. HEENT: Conjunctiva and lids normal. Neck: Supple, no elevated JVP or carotid bruits. Lungs: Clear to auscultation, nonlabored breathing at rest. Cardiac: Regular rate and rhythm, no S3, 2/6 systolic murmur. Extremities: Mild ankle edema.  ECG:  An ECG dated 08/21/2021 was personally reviewed today and demonstrated:  Sinus rhythm with prolonged PR interval and leftward axis.  Labwork:  July 2024: Potassium 4.8, BUN 18, creatinine 1.13, AST 18, ALT 22  Other Studies Reviewed Today:  Echocardiogram 07/20/2022:  1. Left ventricular ejection fraction, by estimation, is 60 to 65%. The  left ventricle has normal function. The left ventricle has no regional  wall motion abnormalities. Left ventricular diastolic parameters are  consistent with Grade I diastolic  dysfunction (impaired relaxation). The E/e' is 21.2.   2. Right ventricular  systolic function is normal. The right ventricular  size is normal.   3. The mitral valve is abnormal. Trivial mitral valve regurgitation. No  evidence of mitral stenosis. Moderate to severe mitral annular  calcification.   4. The aortic valve has been repaired/replaced. Aortic valve  regurgitation is mild. No aortic stenosis is present. There is a 21 mm  Magna Ease Valve valve present in the aortic position. Procedure Date:  2013. Aortic valve mean gradient measures 18.2  mmHg.   5. Aortic dilatation noted. There is borderline dilatation of the  ascending aorta, measuring 39 mm.   Assessment and Plan:  1.  Multivessel CAD status post DES to the LAD in 2007 and subsequently CABG in 2013.  LVEF 60 to 65% by echocardiogram in July.  He does not report any active angina or nitroglycerin use.  Continue aspirin, Lipitor, and Lotrel.   2.  History of aortic stenosis status post bioprosthetic AVR in 2013, 21 mm Magna Ease prosthesis.  Follow-up echocardiogram in July showed stable prosthetic function with mean AV gradient 18 mmHg and mild aortic regurgitation.  Continue aspirin.   3.  Essential hypertension.  Blood pressure mildly elevated today.  No changes were made today, continue Lotrel and HCTZ as before.  Disposition:  Follow up  6 months.  Signed, Jonelle Sidle, M.D., F.A.C.C. [facility] at [facility]

## 2022-09-15 NOTE — Patient Instructions (Addendum)

## 2022-10-19 DIAGNOSIS — Z23 Encounter for immunization: Secondary | ICD-10-CM | POA: Diagnosis not present

## 2022-10-29 DIAGNOSIS — H43813 Vitreous degeneration, bilateral: Secondary | ICD-10-CM | POA: Diagnosis not present

## 2022-11-10 DIAGNOSIS — Z299 Encounter for prophylactic measures, unspecified: Secondary | ICD-10-CM | POA: Diagnosis not present

## 2022-11-10 DIAGNOSIS — R059 Cough, unspecified: Secondary | ICD-10-CM | POA: Diagnosis not present

## 2022-11-10 DIAGNOSIS — J069 Acute upper respiratory infection, unspecified: Secondary | ICD-10-CM | POA: Diagnosis not present

## 2022-11-10 DIAGNOSIS — R062 Wheezing: Secondary | ICD-10-CM | POA: Diagnosis not present

## 2022-11-24 DIAGNOSIS — D485 Neoplasm of uncertain behavior of skin: Secondary | ICD-10-CM | POA: Diagnosis not present

## 2022-11-24 DIAGNOSIS — L814 Other melanin hyperpigmentation: Secondary | ICD-10-CM | POA: Diagnosis not present

## 2022-11-24 DIAGNOSIS — L821 Other seborrheic keratosis: Secondary | ICD-10-CM | POA: Diagnosis not present

## 2022-12-25 DIAGNOSIS — Z1331 Encounter for screening for depression: Secondary | ICD-10-CM | POA: Diagnosis not present

## 2022-12-25 DIAGNOSIS — E7800 Pure hypercholesterolemia, unspecified: Secondary | ICD-10-CM | POA: Diagnosis not present

## 2022-12-25 DIAGNOSIS — Z299 Encounter for prophylactic measures, unspecified: Secondary | ICD-10-CM | POA: Diagnosis not present

## 2022-12-25 DIAGNOSIS — I10 Essential (primary) hypertension: Secondary | ICD-10-CM | POA: Diagnosis not present

## 2022-12-25 DIAGNOSIS — Z125 Encounter for screening for malignant neoplasm of prostate: Secondary | ICD-10-CM | POA: Diagnosis not present

## 2022-12-25 DIAGNOSIS — Z7189 Other specified counseling: Secondary | ICD-10-CM | POA: Diagnosis not present

## 2022-12-25 DIAGNOSIS — Z1339 Encounter for screening examination for other mental health and behavioral disorders: Secondary | ICD-10-CM | POA: Diagnosis not present

## 2022-12-25 DIAGNOSIS — Z0000 Encounter for general adult medical examination without abnormal findings: Secondary | ICD-10-CM | POA: Diagnosis not present

## 2022-12-25 DIAGNOSIS — R5383 Other fatigue: Secondary | ICD-10-CM | POA: Diagnosis not present

## 2022-12-25 DIAGNOSIS — Z79899 Other long term (current) drug therapy: Secondary | ICD-10-CM | POA: Diagnosis not present

## 2023-02-03 DIAGNOSIS — U071 COVID-19: Secondary | ICD-10-CM | POA: Diagnosis not present

## 2023-02-03 DIAGNOSIS — R5383 Other fatigue: Secondary | ICD-10-CM | POA: Diagnosis not present

## 2023-02-03 DIAGNOSIS — Z299 Encounter for prophylactic measures, unspecified: Secondary | ICD-10-CM | POA: Diagnosis not present

## 2023-02-12 DIAGNOSIS — R0981 Nasal congestion: Secondary | ICD-10-CM | POA: Diagnosis not present

## 2023-02-12 DIAGNOSIS — R059 Cough, unspecified: Secondary | ICD-10-CM | POA: Diagnosis not present

## 2023-02-12 DIAGNOSIS — Z299 Encounter for prophylactic measures, unspecified: Secondary | ICD-10-CM | POA: Diagnosis not present

## 2023-02-16 DIAGNOSIS — H353112 Nonexudative age-related macular degeneration, right eye, intermediate dry stage: Secondary | ICD-10-CM | POA: Diagnosis not present

## 2023-03-25 ENCOUNTER — Ambulatory Visit: Payer: Medicare Other | Admitting: Cardiology

## 2023-03-25 ENCOUNTER — Ambulatory Visit: Payer: Medicare Other | Attending: Cardiology | Admitting: Cardiology

## 2023-03-25 ENCOUNTER — Encounter: Payer: Self-pay | Admitting: Cardiology

## 2023-03-25 VITALS — BP 138/58 | HR 76 | Ht 70.0 in | Wt 169.0 lb

## 2023-03-25 DIAGNOSIS — I10 Essential (primary) hypertension: Secondary | ICD-10-CM | POA: Insufficient documentation

## 2023-03-25 DIAGNOSIS — Z953 Presence of xenogenic heart valve: Secondary | ICD-10-CM | POA: Diagnosis not present

## 2023-03-25 DIAGNOSIS — I25119 Atherosclerotic heart disease of native coronary artery with unspecified angina pectoris: Secondary | ICD-10-CM | POA: Diagnosis not present

## 2023-03-25 DIAGNOSIS — E782 Mixed hyperlipidemia: Secondary | ICD-10-CM | POA: Insufficient documentation

## 2023-03-25 DIAGNOSIS — H18513 Endothelial corneal dystrophy, bilateral: Secondary | ICD-10-CM | POA: Diagnosis not present

## 2023-03-25 NOTE — Patient Instructions (Signed)
 Medication Instructions:  Continue all current medications.   Labwork: none  Testing/Procedures: none  Follow-Up: 6 months   Any Other Special Instructions Will Be Listed Below (If Applicable).   If you need a refill on your cardiac medications before your next appointment, please call your pharmacy.

## 2023-03-25 NOTE — Progress Notes (Signed)
    Cardiology Office Note  Date: 03/25/2023   ID: DEMETREUS LOTHAMER, DOB [birth year], MRN [id number]  History of Present Illness: Edward Pena is a 89 y.o. male last seen in September 2024.  He is here for a routine visit.  Still lives in his own home, has a neighbor come by to sit with him during the day.  He does not report any angina or nitroglycerin use.  Stable NYHA class II dyspnea with typical activities.  We reviewed his medications today.  He reports compliance with current regimen, no obvious intolerances.  I went over his interval lab work which is noted below.  Physical Exam: VS:  BP (!) 138/58   Pulse 76   Ht 5\' 10"  (1.778 m)   Wt 169 lb (76.7 kg)   SpO2 96%   BMI 24.25 kg/m , BMI Body mass index is 24.25 kg/m.  Wt Readings from Last 3 Encounters:  03/25/23 169 lb (76.7 kg)  09/15/22 173 lb 9.6 oz (78.7 kg)  07/10/22 171 lb 9.6 oz (77.8 kg)    General: Patient appears comfortable at rest. HEENT: Conjunctiva and lids normal. Neck: Supple, no elevated JVP or carotid bruits. Lungs: Clear to auscultation, nonlabored breathing at rest. Cardiac: Regular rate and rhythm, no S3, 2/6 systolic murmur. Extremities: No pitting edema.  ECG:  An ECG dated 09/15/2022 was personally reviewed today and demonstrated:  Sinus rhythm with prolonged PR interval, left anterior fascicular block.  Labwork:  December 2024: Hemoglobin 11.9, platelets 249, BUN 19, creatinine 1.12, potassium 5.3, AST 21, ALT 13, cholesterol 200, triglycerides 80, HDL 79, LDL 107, TSH 2.32  Other Studies Reviewed Today:  No interval cardiac testing for review today.  Assessment and Plan:  1.  Multivessel CAD status post DES to the LAD in 2007 and subsequently CABG in 2013.  LVEF 60 to 65% by echocardiogram in July 2024.  No angina on current medical therapy, continue observation.  He is on aspirin 81 mg daily, Lipitor 20 mg daily, and as needed nitroglycerin.   2.  History of aortic stenosis status post  bioprosthetic AVR in 2013, 21 mm Magna Ease prosthesis.  Follow-up echocardiogram in July 2024 showed stable prosthetic function with mean AV gradient 18 mmHg and mild aortic regurgitation.  Continue aspirin 81 mg daily.   3.  Primary hypertension.  Continue HCTZ 12.5 mg daily and Lotrel 5/40 mg daily.  Disposition:  Follow up  6 months.  Signed, Jonelle Sidle, M.D., F.A.C.C. [facility] at [facility]

## 2023-03-26 DIAGNOSIS — Z299 Encounter for prophylactic measures, unspecified: Secondary | ICD-10-CM | POA: Diagnosis not present

## 2023-03-26 DIAGNOSIS — I272 Pulmonary hypertension, unspecified: Secondary | ICD-10-CM | POA: Diagnosis not present

## 2023-03-26 DIAGNOSIS — I700 Atherosclerosis of aorta: Secondary | ICD-10-CM | POA: Diagnosis not present

## 2023-03-26 DIAGNOSIS — I10 Essential (primary) hypertension: Secondary | ICD-10-CM | POA: Diagnosis not present

## 2023-03-26 DIAGNOSIS — K219 Gastro-esophageal reflux disease without esophagitis: Secondary | ICD-10-CM | POA: Diagnosis not present

## 2023-03-26 DIAGNOSIS — D692 Other nonthrombocytopenic purpura: Secondary | ICD-10-CM | POA: Diagnosis not present

## 2023-06-16 DIAGNOSIS — D485 Neoplasm of uncertain behavior of skin: Secondary | ICD-10-CM | POA: Diagnosis not present

## 2023-06-16 DIAGNOSIS — D0461 Carcinoma in situ of skin of right upper limb, including shoulder: Secondary | ICD-10-CM | POA: Diagnosis not present

## 2023-06-24 DIAGNOSIS — C44622 Squamous cell carcinoma of skin of right upper limb, including shoulder: Secondary | ICD-10-CM | POA: Diagnosis not present

## 2023-07-06 ENCOUNTER — Other Ambulatory Visit: Payer: Self-pay | Admitting: Cardiology

## 2023-07-27 DIAGNOSIS — Z299 Encounter for prophylactic measures, unspecified: Secondary | ICD-10-CM | POA: Diagnosis not present

## 2023-07-27 DIAGNOSIS — I10 Essential (primary) hypertension: Secondary | ICD-10-CM | POA: Diagnosis not present

## 2023-07-27 DIAGNOSIS — I272 Pulmonary hypertension, unspecified: Secondary | ICD-10-CM | POA: Diagnosis not present

## 2023-07-27 DIAGNOSIS — M1990 Unspecified osteoarthritis, unspecified site: Secondary | ICD-10-CM | POA: Diagnosis not present

## 2023-07-27 DIAGNOSIS — I700 Atherosclerosis of aorta: Secondary | ICD-10-CM | POA: Diagnosis not present

## 2023-10-04 DIAGNOSIS — Z299 Encounter for prophylactic measures, unspecified: Secondary | ICD-10-CM | POA: Diagnosis not present

## 2023-10-04 DIAGNOSIS — K219 Gastro-esophageal reflux disease without esophagitis: Secondary | ICD-10-CM | POA: Diagnosis not present

## 2023-10-04 DIAGNOSIS — J069 Acute upper respiratory infection, unspecified: Secondary | ICD-10-CM | POA: Diagnosis not present

## 2023-11-05 DIAGNOSIS — Z299 Encounter for prophylactic measures, unspecified: Secondary | ICD-10-CM | POA: Diagnosis not present

## 2023-11-05 DIAGNOSIS — I2510 Atherosclerotic heart disease of native coronary artery without angina pectoris: Secondary | ICD-10-CM | POA: Diagnosis not present

## 2023-11-05 DIAGNOSIS — K219 Gastro-esophageal reflux disease without esophagitis: Secondary | ICD-10-CM | POA: Diagnosis not present

## 2023-11-05 DIAGNOSIS — J069 Acute upper respiratory infection, unspecified: Secondary | ICD-10-CM | POA: Diagnosis not present

## 2023-11-10 ENCOUNTER — Ambulatory Visit: Attending: Cardiology | Admitting: Cardiology

## 2023-11-10 ENCOUNTER — Encounter: Payer: Self-pay | Admitting: Cardiology

## 2023-11-10 VITALS — BP 132/60 | HR 74 | Ht 70.0 in | Wt 168.6 lb

## 2023-11-10 DIAGNOSIS — Z952 Presence of prosthetic heart valve: Secondary | ICD-10-CM | POA: Insufficient documentation

## 2023-11-10 DIAGNOSIS — I10 Essential (primary) hypertension: Secondary | ICD-10-CM | POA: Insufficient documentation

## 2023-11-10 DIAGNOSIS — I25119 Atherosclerotic heart disease of native coronary artery with unspecified angina pectoris: Secondary | ICD-10-CM | POA: Diagnosis not present

## 2023-11-10 DIAGNOSIS — E782 Mixed hyperlipidemia: Secondary | ICD-10-CM | POA: Insufficient documentation

## 2023-11-10 MED ORDER — NITROGLYCERIN 0.4 MG SL SUBL: 0.4000 mg | SUBLINGUAL_TABLET | SUBLINGUAL | 3 refills | Status: AC | PRN

## 2023-11-10 NOTE — Progress Notes (Signed)
    Cardiology Office Note  Date: 11/10/2023   ID: Edward Pena, DOB Jun 30, 1926, MRN [id number]  History of Present Illness: Edward Pena is a 89 y.o. male last seen in March.  He is here today with family member for a follow-up visit.  Reports no angina at current level of activity, no interval nitroglycerin  use, he has an old bottle and needs a refill.  I went over his medications and rechecked his blood pressure 132/60.  He continues to track blood pressure readings at home.  Also following with Dr. Rosamond for primary care.  I reviewed his ECG today which shows normal sinus rhythm with possible old septal infarct pattern.  Physical Exam: VS:  BP 132/60 (BP Location: Right Arm)   Pulse 74   Ht 5' 10 (1.778 m)   Wt 168 lb 9.6 oz (76.5 kg)   BMI 24.19 kg/m , BMI Body mass index is 24.19 kg/m.  Wt Readings from Last 3 Encounters:  11/10/23 168 lb 9.6 oz (76.5 kg)  03/25/23 169 lb (76.7 kg)  09/15/22 173 lb 9.6 oz (78.7 kg)    General: Patient appears comfortable at rest. HEENT: Conjunctiva and lids normal. Neck: Supple, no elevated JVP or carotid bruits. Lungs: Clear to auscultation, nonlabored breathing at rest. Cardiac: Regular rate and rhythm, no S3, 2/6 systolic murmur. Extremities: No pitting edema.  ECG:  An ECG dated 09/15/2022 was personally reviewed today and demonstrated:  Sinus rhythm with prolonged PR interval, left anterior fascicular block.  Labwork:  December 2024: Hemoglobin 11.9, platelets 249, BUN 19, creatinine 1.12, GFR 61, potassium 5.3, AST 21, ALT 13, cholesterol 200, triglycerides 80, HDL 79, LDL 107, TSH 2.32  Other Studies Reviewed Today:  No interval cardiac testing for review today.  Assessment and Plan:  1.  Multivessel CAD status post DES to the LAD in 2007 and subsequently CABG in 2013.  LVEF 60 to 65% by echocardiogram in July 2024.  He remains clinically stable with no recurring angina or interval nitroglycerin  use.  Refill provided for  fresh bottle.  Continue aspirin  81 mg daily, Lipitor 20 mg 2-3 times a week, and as needed nitroglycerin .   2.  History of aortic stenosis status post bioprosthetic AVR in 2013, 21 mm Magna Ease prosthesis.  Follow-up echocardiogram in July 2024 showed stable prosthetic function with mean AV gradient 18 mmHg and mild aortic regurgitation.  Continue aspirin  81 mg daily.   3.  Primary hypertension.  No change to current regimen.  He is on Lotrel 5/40 mg daily and HCTZ 12.5 mg daily.  Disposition:  Follow up 6 months.  Signed, Jayson JUDITHANN Sierras, M.D., F.A.C.C. [facility] at [facility]

## 2023-11-10 NOTE — Patient Instructions (Addendum)

## 2023-11-16 DIAGNOSIS — H353113 Nonexudative age-related macular degeneration, right eye, advanced atrophic without subfoveal involvement: Secondary | ICD-10-CM | POA: Diagnosis not present

## 2023-12-08 DIAGNOSIS — J069 Acute upper respiratory infection, unspecified: Secondary | ICD-10-CM | POA: Diagnosis not present

## 2023-12-08 DIAGNOSIS — Z299 Encounter for prophylactic measures, unspecified: Secondary | ICD-10-CM | POA: Diagnosis not present

## 2023-12-08 DIAGNOSIS — I272 Pulmonary hypertension, unspecified: Secondary | ICD-10-CM | POA: Diagnosis not present

## 2023-12-08 DIAGNOSIS — I2510 Atherosclerotic heart disease of native coronary artery without angina pectoris: Secondary | ICD-10-CM | POA: Diagnosis not present

## 2023-12-15 DIAGNOSIS — Z7189 Other specified counseling: Secondary | ICD-10-CM | POA: Diagnosis not present

## 2023-12-15 DIAGNOSIS — Z1339 Encounter for screening examination for other mental health and behavioral disorders: Secondary | ICD-10-CM | POA: Diagnosis not present

## 2023-12-15 DIAGNOSIS — Z125 Encounter for screening for malignant neoplasm of prostate: Secondary | ICD-10-CM | POA: Diagnosis not present

## 2023-12-15 DIAGNOSIS — I10 Essential (primary) hypertension: Secondary | ICD-10-CM | POA: Diagnosis not present

## 2023-12-15 DIAGNOSIS — Z1331 Encounter for screening for depression: Secondary | ICD-10-CM | POA: Diagnosis not present

## 2023-12-15 DIAGNOSIS — E7800 Pure hypercholesterolemia, unspecified: Secondary | ICD-10-CM | POA: Diagnosis not present

## 2023-12-15 DIAGNOSIS — Z299 Encounter for prophylactic measures, unspecified: Secondary | ICD-10-CM | POA: Diagnosis not present

## 2023-12-15 DIAGNOSIS — R5383 Other fatigue: Secondary | ICD-10-CM | POA: Diagnosis not present

## 2023-12-15 DIAGNOSIS — Z79899 Other long term (current) drug therapy: Secondary | ICD-10-CM | POA: Diagnosis not present

## 2023-12-15 DIAGNOSIS — Z0000 Encounter for general adult medical examination without abnormal findings: Secondary | ICD-10-CM | POA: Diagnosis not present

## 2024-02-12 ENCOUNTER — Other Ambulatory Visit: Payer: Self-pay | Admitting: Cardiology

## 2024-02-16 NOTE — Telephone Encounter (Signed)
 Labs on 12/15/23 Outside of Normal Range  In accordance with refill protocols, please review and address the following requirements before this medication refill can be authorized:  Labs
